# Patient Record
Sex: Female | Born: 1935 | Race: White | Hispanic: No | Smoking: Never smoker
Health system: Southern US, Community
[De-identification: ages and names within clinical notes are randomized; demographics above are authoritative.]

## PROBLEM LIST (undated history)

## (undated) DIAGNOSIS — T7840XA Allergy, unspecified, initial encounter: Secondary | ICD-10-CM

## (undated) DIAGNOSIS — E079 Disorder of thyroid, unspecified: Secondary | ICD-10-CM

## (undated) DIAGNOSIS — K219 Gastro-esophageal reflux disease without esophagitis: Secondary | ICD-10-CM

## (undated) DIAGNOSIS — M419 Scoliosis, unspecified: Secondary | ICD-10-CM

## (undated) DIAGNOSIS — E785 Hyperlipidemia, unspecified: Secondary | ICD-10-CM

## (undated) DIAGNOSIS — Z8619 Personal history of other infectious and parasitic diseases: Secondary | ICD-10-CM

## (undated) HISTORY — DX: Gastro-esophageal reflux disease without esophagitis: K21.9

## (undated) HISTORY — PX: ADENOIDECTOMY: SUR15

## (undated) HISTORY — DX: Personal history of other infectious and parasitic diseases: Z86.19

## (undated) HISTORY — DX: Allergy, unspecified, initial encounter: T78.40XA

## (undated) HISTORY — DX: Hyperlipidemia, unspecified: E78.5

## (undated) HISTORY — PX: TONSILLECTOMY: SUR1361

## (undated) HISTORY — PX: WRIST FRACTURE SURGERY: SHX121

## (undated) HISTORY — PX: REPLACEMENT TOTAL KNEE: SUR1224

## (undated) HISTORY — DX: Scoliosis, unspecified: M41.9

## (undated) HISTORY — DX: Disorder of thyroid, unspecified: E07.9

---

## 1966-02-04 HISTORY — PX: HERNIA REPAIR: SHX51

## 1978-02-04 HISTORY — PX: ABDOMINAL HYSTERECTOMY: SHX81

## 2010-12-05 DIAGNOSIS — N393 Stress incontinence (female) (male): Secondary | ICD-10-CM | POA: Insufficient documentation

## 2012-03-09 DIAGNOSIS — H532 Diplopia: Secondary | ICD-10-CM | POA: Insufficient documentation

## 2016-12-30 LAB — BASIC METABOLIC PANEL
BUN: 11 (ref 4–21)
CO2: 31 — AB (ref 13–22)
Chloride: 108 (ref 99–108)
Creatinine: 0.8 (ref 0.5–1.1)
Glucose: 101
Potassium: 4.4 (ref 3.4–5.3)
Sodium: 146 (ref 137–147)

## 2016-12-30 LAB — CBC AND DIFFERENTIAL
HCT: 43 (ref 36–46)
Hemoglobin: 14.5 (ref 12.0–16.0)
Neutrophils Absolute: 3
Platelets: 326 (ref 150–399)
WBC: 5.8

## 2016-12-30 LAB — LIPID PANEL
Cholesterol: 193 (ref 0–200)
HDL: 69 (ref 35–70)
LDL Cholesterol: 102
LDl/HDL Ratio: 2.8
Triglycerides: 109 (ref 40–160)

## 2016-12-30 LAB — COMPREHENSIVE METABOLIC PANEL
Albumin: 3.6 (ref 3.5–5.0)
Calcium: 9.2 (ref 8.7–10.7)

## 2016-12-30 LAB — TSH: TSH: 1.61 (ref 0.41–5.90)

## 2016-12-30 LAB — HEPATIC FUNCTION PANEL
ALT: 28 (ref 7–35)
AST: 17 (ref 13–35)
Alkaline Phosphatase: 76 (ref 25–125)
Bilirubin, Total: 0.7

## 2016-12-30 LAB — CBC: RBC: 5.03 (ref 3.87–5.11)

## 2019-05-10 ENCOUNTER — Ambulatory Visit: Payer: Self-pay | Admitting: Family Medicine

## 2019-05-13 ENCOUNTER — Encounter: Payer: Self-pay | Admitting: Family Medicine

## 2019-05-13 ENCOUNTER — Other Ambulatory Visit: Payer: Self-pay

## 2019-05-13 ENCOUNTER — Telehealth (INDEPENDENT_AMBULATORY_CARE_PROVIDER_SITE_OTHER): Payer: Medicare Other | Admitting: Family Medicine

## 2019-05-13 VITALS — Ht 63.0 in

## 2019-05-13 DIAGNOSIS — E785 Hyperlipidemia, unspecified: Secondary | ICD-10-CM | POA: Diagnosis not present

## 2019-05-13 DIAGNOSIS — R1013 Epigastric pain: Secondary | ICD-10-CM | POA: Diagnosis not present

## 2019-05-13 DIAGNOSIS — M546 Pain in thoracic spine: Secondary | ICD-10-CM | POA: Diagnosis not present

## 2019-05-13 DIAGNOSIS — G8929 Other chronic pain: Secondary | ICD-10-CM

## 2019-05-13 DIAGNOSIS — E039 Hypothyroidism, unspecified: Secondary | ICD-10-CM

## 2019-05-13 MED ORDER — OMEPRAZOLE 40 MG PO CPDR: 40.0000 mg | DELAYED_RELEASE_CAPSULE | Freq: Every day | ORAL | 3 refills | Status: DC

## 2019-05-13 NOTE — Progress Notes (Signed)
I have discussed the procedure for the virtual visit with the patient who has given consent to proceed with assessment and treatment.   Pt unable to obtain vitals.   Concerned about reflux increase (not using meds), back pain around kidneys (x years on and off), abd pain every time she eats.   Geannie Risen, CMA

## 2019-05-13 NOTE — Progress Notes (Signed)
Virtual Visit via Video   I connected with patient on 05/13/19 at  2:30 PM EDT by a video enabled telemedicine application and verified that I am speaking with the correct person using two identifiers.  Location patient: Home Location provider: [facility], Office Persons participating in the virtual visit: Patient, Provider, CMA (Jess B)  I discussed the limitations of evaluation and management by telemedicine and the availability of in person appointments. The patient expressed understanding and agreed to proceed.  Subjective:   HPI:   New to establish.  Recently moved from [place] and lives at [place].  Went to [facility] 'a couple of times' since moving here.  Hyperlipidemia- chronic problem, on Lipitor 40mg  daily.  Last labs ~3 months ago.  No CP, SOB, HAs.  + abd pain.  Hypothyroid- chronic problem, on Levothyroxine daily.  Last labs ~3 months.  Abd pain- 'every time I eat I get a stomach ache'.  sxs started ~2-3 months ago.  No nausea.  sxs are epigastric.  No radiation of pain into chest.  Occurs just after eating.  Pt has known GI upset w/ NSAIDs- rarely takes.  Not currently taking anything for GERD (has prescription for Omeprazole).  Will take Tums occasionally.    Back pain- 'I have a lot of pain across my kidneys'.  Pain improves w/ moving, worse when lying in bed.  No urinary symptoms like frequency, dysuria, urgency.  Pt has known scoliosis.  Pt doesn't take anything for pain- occasional tylenol.  ROS:   See pertinent positives and negatives per HPI.  Patient Active Problem List   Diagnosis Date Noted  . Diplopia 03/09/2012  . Female stress incontinence 12/05/2010    Social History   Tobacco Use  . Smoking status: Never Smoker  . Smokeless tobacco: Never Used  Substance Use Topics  . Alcohol use: Yes    Current Outpatient Medications:  .  aspirin 81 MG EC tablet, Take by mouth., Disp: , Rfl:  .  atorvastatin (LIPITOR) 40 MG  tablet, Take 40 mg by mouth daily., Disp: , Rfl:  .  Cholecalciferol 50 MCG (2000 UT) CAPS, TAKE 2 CAPSULES BY MOUTH EVERY DAY, Disp: , Rfl:  .  levothyroxine (SYNTHROID) 75 MCG tablet, Take 75 mcg by mouth daily., Disp: , Rfl:  .  loratadine (CLARITIN) 10 MG tablet, Take by mouth., Disp: , Rfl:  .  oxybutynin (DITROPAN-XL) 10 MG 24 hr tablet, Take 10 mg by mouth daily., Disp: , Rfl:  .  omeprazole (PRILOSEC) 40 MG capsule, TAKE 1 CAPSULE BY MOUTH EVERY MORNING BEFORE BREAKFAST. TAKE 1 HOUR BEFORE BREAKFAST, Disp: , Rfl:   No Known Allergies  Objective:   Ht 5\' 3"  (1.6 m)   AAOx3, NAD NCAT, EOMI No obvious CN deficits Coloring WNL Pt is able to speak clearly, coherently without shortness of breath or increased work of breathing.  Thought process is linear.  Mood is appropriate.   Assessment and Plan:   Hyperlipidemia- chronic problem, tolerating statin w/o difficulty.  Had labs done within last 3 months.  Continue current medications.  Will follow.  Hypothyroid- chronic problem, currently asymptomatic.  Had labs done within last 3 months.  Continue current meds.  Will follow.  Epigastric pain- new.  Pt has been experiencing sxs x2-3 months.  Occurs each time she eats.  Will start Omeprazole daily after reviewing with pt why medication is important.  Pt expressed understanding and is in agreement w/ plan.   Back pain- ongoing  issue for pt.  Likely due to her known scoliosis.  Unable to tolerate NSAIDs so encouraged her to start Tylenol Arthritis BID to stay ahead of discomfort.  Pt expressed understanding and is in agreement w/ plan.    Annye Asa, MD 05/13/2019

## 2019-05-14 ENCOUNTER — Ambulatory Visit: Payer: Self-pay | Admitting: Family Medicine

## 2019-05-17 ENCOUNTER — Telehealth: Payer: Self-pay | Admitting: Family Medicine

## 2019-05-17 NOTE — Telephone Encounter (Signed)
LM asking pt to call back to schedule a F/UP appt with Tabori in 2-3 wks for GERD

## 2019-06-04 ENCOUNTER — Encounter: Payer: Self-pay | Admitting: General Practice

## 2019-06-08 ENCOUNTER — Other Ambulatory Visit: Payer: Self-pay

## 2019-06-08 ENCOUNTER — Encounter: Payer: Self-pay | Admitting: Family Medicine

## 2019-06-08 ENCOUNTER — Telehealth (INDEPENDENT_AMBULATORY_CARE_PROVIDER_SITE_OTHER): Payer: Medicare Other | Admitting: Family Medicine

## 2019-06-08 DIAGNOSIS — R1013 Epigastric pain: Secondary | ICD-10-CM | POA: Diagnosis not present

## 2019-06-08 NOTE — Progress Notes (Signed)
I have discussed the procedure for the virtual visit with the patient who has given consent to proceed with assessment and treatment.   Pt unable to obtain vitals.   Nadege Carriger L Delawrence Fridman, CMA     

## 2019-06-08 NOTE — Progress Notes (Signed)
   Virtual Visit via Video   I connected with patient on 06/08/19 at 11:30 AM EDT by a video enabled telemedicine application and verified that I am speaking with the correct person using two identifiers.  Location patient: Home Location provider: [facility], Office Persons participating in the virtual visit: Patient, Provider, CMA (Jess B)  I discussed the limitations of evaluation and management by telemedicine and the availability of in person appointments. The patient expressed understanding and agreed to proceed.  Interactive audio and video telecommunications were attempted between this provider and patient, however failed, due to patient having technical difficulties OR patient did not have access to video capability.  We continued and completed visit with audio only.   Subjective:   HPI:   Epigastric pain/GERD- taking Omeprazole 40mg  daily.  No longer having upset stomach after eating.  Able to sleep well.  Has not had to change diet or restrict herself.  Overall she is pleased  ROS:   See pertinent positives and negatives per HPI.   Patient Active Problem List   Diagnosis Date Noted  . Diplopia 03/09/2012  . Female stress incontinence 12/05/2010    Social History   Tobacco Use  . Smoking status: Never Smoker  . Smokeless tobacco: Never Used  Substance Use Topics  . Alcohol use: Yes    Current Outpatient Medications:  .  aspirin 81 MG EC tablet, Take by mouth., Disp: , Rfl:  .  atorvastatin (LIPITOR) 40 MG tablet, Take 40 mg by mouth daily., Disp: , Rfl:  .  Cholecalciferol 50 MCG (2000 UT) CAPS, TAKE 2 CAPSULES BY MOUTH EVERY DAY, Disp: , Rfl:  .  levothyroxine (SYNTHROID) 75 MCG tablet, Take 75 mcg by mouth daily., Disp: , Rfl:  .  loratadine (CLARITIN) 10 MG tablet, Take by mouth., Disp: , Rfl:  .  omeprazole (PRILOSEC) 40 MG capsule, Take 1 capsule (40 mg total) by mouth daily., Disp: 30 capsule, Rfl: 3 .  oxybutynin (DITROPAN-XL) 10 MG 24 hr tablet,  Take 10 mg by mouth daily., Disp: , Rfl:   No Known Allergies  Objective:   There were no vitals taken for this visit.  Pt is able to speak clearly, coherently without shortness of breath or increased work of breathing.  Thought process is linear.  Mood is appropriate.    Assessment and Plan:   Epigastric pain- much improved since starting daily Omeprazole.  She is pleased w/ the results and has not had to make many (if any) changes to her diet.  Continue current meds.  Will follow.   12/07/2010, MD 06/08/2019  Time spent with the patient: 7 minutes, of which >50% was spent in obtaining information about symptoms, reviewing previous labs, evaluations, and treatments, counseling about condition (please see the discussed topics above), and developing a plan to further investigate it; had a number of questions which I addressed.

## 2019-08-02 NOTE — Progress Notes (Signed)
Subjective:   Sabrina Castaneda is a 84 y.o. female who presents for an Initial Medicare Annual Wellness Visit.  Review of Systems     Cardiac Risk Factors include: advanced age (>60men, >67 women);sedentary lifestyle     Objective:    Today's Vitals   08/03/19 1024 08/03/19 1026  BP: 132/84   Pulse: 82   Resp: 12   Temp: 98.2 F (36.8 C)   TempSrc: Temporal   SpO2: 96%   Weight: 153 lb (69.4 kg)   Height: 5\' 3"  (1.6 m)   PainSc:  5    Body mass index is 27.1 kg/m.  Advanced Directives 08/03/2019  Does Patient Have a Medical Advance Directive? Yes  Type of 08/05/2019 of Sabrina Castaneda;Living will  Copy of Healthcare Power of Attorney in Chart? No - copy requested    Current Medications (verified) Outpatient Encounter Medications as of 08/03/2019  Medication Sig  . aspirin 81 MG EC tablet Take by mouth.  08/05/2019 atorvastatin (LIPITOR) 40 MG tablet Take 40 mg by mouth daily.  . Cholecalciferol 50 MCG (2000 UT) CAPS TAKE 2 CAPSULES BY MOUTH EVERY DAY  . levothyroxine (SYNTHROID) 75 MCG tablet Take 75 mcg by mouth daily.  Marland Kitchen loratadine (CLARITIN) 10 MG tablet Take by mouth.  Marland Kitchen omeprazole (PRILOSEC) 40 MG capsule Take 1 capsule (40 mg total) by mouth daily.  Marland Kitchen oxybutynin (DITROPAN-XL) 10 MG 24 hr tablet Take 10 mg by mouth daily.   No facility-administered encounter medications on file as of 08/03/2019.    Allergies (verified) Patient has no known allergies.   History: Past Medical History:  Diagnosis Date  . Allergy   . GERD (gastroesophageal reflux disease)   . History of chicken pox   . Hyperlipidemia   . Scoliosis   . Thyroid disease    Past Surgical History:  Procedure Laterality Date  . ABDOMINAL HYSTERECTOMY  1980  . HERNIA REPAIR  02/04/1966  . REPLACEMENT TOTAL KNEE Right   . WRIST FRACTURE SURGERY Bilateral    History reviewed. No pertinent family history. Social History   Socioeconomic History  . Marital status: Widowed    Spouse  name: Not on file  . Number of children: Not on file  . Years of education: Not on file  . Highest education level: Not on file  Occupational History  . Not on file  Tobacco Use  . Smoking status: Never Smoker  . Smokeless tobacco: Never Used  Vaping Use  . Vaping Use: Never used  Substance and Sexual Activity  . Alcohol use: Yes    Comment: occasionally  . Drug use: Never  . Sexual activity: Not Currently  Other Topics Concern  . Not on file  Social History Narrative  . Not on file   Social Determinants of Health   Financial Resource Strain: Low Risk   . Difficulty of Paying Living Expenses: Not hard at all  Food Insecurity: No Food Insecurity  . Worried About 04/05/1966 in the Last Year: Never true  . Ran Out of Food in the Last Year: Never true  Transportation Needs: No Transportation Needs  . Lack of Transportation (Medical): No  . Lack of Transportation (Non-Medical): No  Physical Activity: Inactive  . Days of Exercise per Week: 0 days  . Minutes of Exercise per Session: 0 min  Stress: No Stress Concern Present  . Feeling of Stress : Not at all  Social Connections: Moderately Integrated  . Frequency of Communication  with Friends and Family: More than three times a week  . Frequency of Social Gatherings with Friends and Family: More than three times a week  . Attends Religious Services: More than 4 times per year  . Active Member of Clubs or Organizations: Yes  . Attends Banker Meetings: More than 4 times per year  . Marital Status: Widowed    Tobacco Counseling Counseling given: Not Answered   Clinical Intake:  Pre-visit preparation completed: Yes  Pain : 0-10 Pain Score: 5  Pain Location: Shoulder Pain Orientation: Right Pain Onset: More than a month ago Pain Frequency: Constant Pain Relieving Factors: Tylenol Arthriris  Pain Relieving Factors: Tylenol Arthriris  Nutritional Status: BMI 25 -29 Overweight Nutritional Risks:  None Diabetes: No  How often do you need to have someone help you when you read instructions, pamphlets, or other written materials from your doctor or pharmacy?: 1 - Never  Diabetic?No  Interpreter Needed?: No  Information entered by :: Sabrina Sales LPn   Activities of Daily Living In your present state of health, do you have any difficulty performing the following activities: 08/03/2019 06/08/2019  Hearing? N N  Vision? N N  Difficulty concentrating or making decisions? Y N  Comment occasionally forgets -  Walking or climbing stairs? N N  Dressing or bathing? N N  Doing errands, shopping? N N  Preparing Food and eating ? N -  Using the Toilet? N -  In the past six months, have you accidently leaked urine? N -  Do you have problems with loss of bowel control? N -  Managing your Medications? N -  Managing your Finances? N -  Housekeeping or managing your Housekeeping? N -    Patient Care Team: Sabrina Hatch, MD as PCP - General (Family Medicine)  Indicate any recent Medical Services you may have received from other than [facility] providers in the past year (date may be approximate).     Assessment:   This is a routine wellness examination for [name].  Hearing/Vision screen  Hearing Screening   125Hz  250Hz  500Hz  1000Hz  2000Hz  3000Hz  4000Hz  6000Hz  8000Hz   Right ear:           Left ear:           Comments: No issues  Vision Screening Comments: Wears glasses-Last eye exam 04/2019 Sees Dr.  Dietary issues and exercise activities discussed: Current Exercise Habits: The patient does not participate in regular exercise at present, Exercise limited by: orthopedic condition(s)  Goals    . Patient Stated     Continue healthy eating plan      Depression Screen PHQ 2/9 Scores 08/03/2019 06/08/2019 05/13/2019  PHQ - 2 Score 0 0 0  PHQ- 9 Score - - 0    Fall Risk Fall Risk  08/03/2019 06/08/2019 05/13/2019  Falls in the past year? 0 0 0  Number falls in past yr: 0 0 0    Injury with Fall? 0 0 0  Follow up Falls prevention discussed Falls evaluation completed Falls evaluation completed    Any stairs in or around the home? No   Home free of loose throw rugs in walkways, pet beds, electrical cords, etc? Yes  Adequate lighting in your home to reduce risk of falls? Yes   ASSISTIVE DEVICES UTILIZED TO PREVENT FALLS:  Life alert? No  Use of a cane, walker or w/c? No  Grab bars in the bathroom? Yes Shower chair or bench in shower? No  Elevated  toilet seat or a handicapped toilet? No   TIMED UP AND GO:  Was the test performed? Yes .  Length of time to ambulate 10 feet: 11 sec.   Gait slow and steady without use of assistive device  Cognitive Function: Patient paints, does crossword puzzles & reads frequently.     6CIT Screen 08/03/2019  What Year? 0 points  What month? 0 points  What time? 0 points  Count back from 20 0 points  Months in reverse 0 points  Repeat phrase 0 points  Total Score 0    Immunizations Immunization History  Administered Date(s) Administered  . H1N1 03/01/2008  . Influenza, High Dose Seasonal PF 11/17/2011, 11/25/2014, 11/21/2015, 11/20/2016  . Influenza-Unspecified 12/21/1998, 11/16/2003, 11/26/2004, 11/19/2005, 11/25/2006, 03/01/2008, 10/05/2008  . Moderna SARS-COVID-2 Vaccination 02/16/2019, 03/17/2019  . Pneumococcal Conjugate-13 12/14/2014  . Pneumococcal Polysaccharide-23 12/21/1998, 11/11/2008  . Td 02/17/2007  . Tdap 12/16/2008  . Zoster 04/09/2005    TDAP status: Due, Education has been provided regarding the importance of this vaccine. Advised may receive this vaccine at local pharmacy or Health Dept. Aware to provide a copy of the vaccination record if obtained from local pharmacy or Health Dept. Verbalized acceptance and understanding.   Flu Vaccine status: Up to date Due 10/2019  Pneumococcal vaccine status: Up to date   Covid-19 vaccine status: Completed vaccines  Qualifies for Shingles Vaccine?  Yes   Zostavax completed Yes   Shingrix Completed?: No.    Education has been provided regarding the importance of this vaccine. Patient has been advised to call insurance company to determine out of pocket expense if they have not yet received this vaccine. Advised may also receive vaccine at local pharmacy or Health Dept. Verbalized acceptance and understanding.  Screening Tests Health Maintenance  Topic Date Due  . DEXA SCAN  Never done  . TETANUS/TDAP  06/07/2020 (Originally 12/17/2018)  . INFLUENZA VACCINE  09/05/2019  . COVID-19 Vaccine  Completed  . PNA vac Low Risk Adult  Completed    Health Maintenance  Health Maintenance Due  Topic Date Due  . DEXA SCAN  Never done    Colorectal cancer screening: No longer required.    Mammogram status: No longer required.    Bone Density status: Completed unsure of date. Results reflect: Bone density results: OSTEOPENIA. Repeat every 2 years.Per patient  Lung Cancer Screening: (Low Dose CT Chest recommended if Age 46-80 years, 30 pack-year currently smoking OR have quit w/in 15years.) does not qualify.     Additional Screening:  Hepatitis C Screening: does not qualify  Vision Screening: Recommended annual ophthalmology exams for early detection of glaucoma and other disorders of the eye. Is the patient up to date with their annual eye exam?  Yes  Who is the provider or what is the name of the office in which the patient attends annual eye exams? Dr. Delman Cheadle  Dental Screening: Recommended annual dental exams for proper oral hygiene  Community Resource Referral / Chronic Care Management: CRR required this visit?  No   CCM required this visit?  No      Plan:     I have personally reviewed and noted the following in the patient's chart:   . Medical and social history . Use of alcohol, tobacco or illicit drugs  . Current medications and supplements . Functional ability and status . Nutritional status . Physical  activity . Advanced directives . List of other physicians . Hospitalizations, surgeries, and ER visits in previous 12 months .  Vitals . Screenings to include cognitive, depression, and falls . Referrals and appointments  In addition, I have reviewed and discussed with patient certain preventive protocols, quality metrics, and best practice recommendations. A written personalized care plan for preventive services as well as general preventive health recommendations were provided to patient.     Roanna Raider, LPN   05/18/2438  Nurse Health Advisor  Nurse Notes: Patient made an appt to see Dr. Beverely Low for her right shoulder & left 4th finger pain.

## 2019-08-03 ENCOUNTER — Ambulatory Visit (INDEPENDENT_AMBULATORY_CARE_PROVIDER_SITE_OTHER): Payer: Medicare Other

## 2019-08-03 ENCOUNTER — Other Ambulatory Visit: Payer: Self-pay

## 2019-08-03 VITALS — BP 132/84 | HR 82 | Temp 98.2°F | Resp 12 | Ht 63.0 in | Wt 153.0 lb

## 2019-08-03 DIAGNOSIS — Z0000 Encounter for general adult medical examination without abnormal findings: Secondary | ICD-10-CM

## 2019-08-03 NOTE — Patient Instructions (Signed)
Sabrina Castaneda , Thank you for taking time to come for your Medicare Wellness Visit. I appreciate your ongoing commitment to your health goals. Please review the following plan we discussed and let me know if I can assist you in the future.   Screening recommendations/referrals: Colonoscopy: No longer indicated Mammogram: No longer indicated Bone Density: Unsure of last date. Call the office if you would like to schedule. Recommended yearly ophthalmology/optometry visit for glaucoma screening and checkup Recommended yearly dental visit for hygiene and checkup  Vaccinations: Influenza vaccine: Up to date-Due-10/2019 Pneumococcal vaccine: Completed vaccines Tdap vaccine: Discuss with pharmacy. Shingles vaccine: Discuss with pharmacy  Covid-19:Completed vaccines  Advanced directives: Bring a copy to your next office visit  Conditions/risks identified: See problem list  Next appointment: Follow up in one year for your annual wellness visit    Preventive Care 65 Years and Older, Female Preventive care refers to lifestyle choices and visits with your health care provider that can promote health and wellness. What does preventive care include?  A yearly physical exam. This is also called an annual well check.  Dental exams once or twice a year.  Routine eye exams. Ask your health care provider how often you should have your eyes checked.  Personal lifestyle choices, including:  Daily care of your teeth and gums.  Regular physical activity.  Eating a healthy diet.  Avoiding tobacco and drug use.  Limiting alcohol use.  Practicing safe sex.  Taking low-dose aspirin every day.  Taking vitamin and mineral supplements as recommended by your health care provider. What happens during an annual well check? The services and screenings done by your health care provider during your annual well check will depend on your age, overall health, lifestyle risk factors, and family history of  disease. Counseling  Your health care provider may ask you questions about your:  Alcohol use.  Tobacco use.  Drug use.  Emotional well-being.  Home and relationship well-being.  Sexual activity.  Eating habits.  History of falls.  Memory and ability to understand (cognition).  Work and work Astronomer.  Reproductive health. Screening  You may have the following tests or measurements:  Height, weight, and BMI.  Blood pressure.  Lipid and cholesterol levels. These may be checked every 5 years, or more frequently if you are over 90 years old.  Skin check.  Lung cancer screening. You may have this screening every year starting at age 7 if you have a 30-pack-year history of smoking and currently smoke or have quit within the past 15 years.  Fecal occult blood test (FOBT) of the stool. You may have this test every year starting at age 62.  Flexible sigmoidoscopy or colonoscopy. You may have a sigmoidoscopy every 5 years or a colonoscopy every 10 years starting at age 17.  Hepatitis C blood test.  Hepatitis B blood test.  Sexually transmitted disease (STD) testing.  Diabetes screening. This is done by checking your blood sugar (glucose) after you have not eaten for a while (fasting). You may have this done every 1-3 years.  Bone density scan. This is done to screen for osteoporosis. You may have this done starting at age 27.  Mammogram. This may be done every 1-2 years. Talk to your health care provider about how often you should have regular mammograms. Talk with your health care provider about your test results, treatment options, and if necessary, the need for more tests. Vaccines  Your health care provider may recommend certain vaccines, such as:  Influenza vaccine. This is recommended every year.  Tetanus, diphtheria, and acellular pertussis (Tdap, Td) vaccine. You may need a Td booster every 10 years.  Zoster vaccine. You may need this after age  7.  Pneumococcal 13-valent conjugate (PCV13) vaccine. One dose is recommended after age 54.  Pneumococcal polysaccharide (PPSV23) vaccine. One dose is recommended after age 41. Talk to your health care provider about which screenings and vaccines you need and how often you need them. This information is not intended to replace advice given to you by your health care provider. Make sure you discuss any questions you have with your health care provider. Document Released: 02/17/2015 Document Revised: 10/11/2015 Document Reviewed: 11/22/2014 Elsevier Interactive Patient Education  2017 [employer] Prevention in the Home Falls can cause injuries. They can happen to people of all ages. There are many things you can do to make your home safe and to help prevent falls. What can I do on the outside of my home?  Regularly fix the edges of walkways and driveways and fix any cracks.  Remove anything that might make you trip as you walk through a door, such as a raised step or threshold.  Trim any bushes or trees on the path to your home.  Use bright outdoor lighting.  Clear any walking paths of anything that might make someone trip, such as rocks or tools.  Regularly check to see if handrails are loose or broken. Make sure that both sides of any steps have handrails.  Any raised decks and porches should have guardrails on the edges.  Have any leaves, snow, or ice cleared regularly.  Use sand or salt on walking paths during winter.  Clean up any spills in your garage right away. This includes oil or grease spills. What can I do in the bathroom?  Use night lights.  Install grab bars by the toilet and in the tub and shower. Do not use towel bars as grab bars.  Use non-skid mats or decals in the tub or shower.  If you need to sit down in the shower, use a plastic, non-slip stool.  Keep the floor dry. Clean up any water that spills on the floor as soon as it happens.  Remove  soap buildup in the tub or shower regularly.  Attach bath mats securely with double-sided non-slip rug tape.  Do not have throw rugs and other things on the floor that can make you trip. What can I do in the bedroom?  Use night lights.  Make sure that you have a light by your bed that is easy to reach.  Do not use any sheets or blankets that are too big for your bed. They should not hang down onto the floor.  Have a firm chair that has side arms. You can use this for support while you get dressed.  Do not have throw rugs and other things on the floor that can make you trip. What can I do in the kitchen?  Clean up any spills right away.  Avoid walking on wet floors.  Keep items that you use a lot in easy-to-reach places.  If you need to reach something above you, use a strong step stool that has a grab bar.  Keep electrical cords out of the way.  Do not use floor polish or wax that makes floors slippery. If you must use wax, use non-skid floor wax.  Do not have throw rugs and other things on the floor that  can make you trip. What can I do with my stairs?  Do not leave any items on the stairs.  Make sure that there are handrails on both sides of the stairs and use them. Fix handrails that are broken or loose. Make sure that handrails are as long as the stairways.  Check any carpeting to make sure that it is firmly attached to the stairs. Fix any carpet that is loose or worn.  Avoid having throw rugs at the top or bottom of the stairs. If you do have throw rugs, attach them to the floor with carpet tape.  Make sure that you have a light switch at the top of the stairs and the bottom of the stairs. If you do not have them, ask someone to add them for you. What else can I do to help prevent falls?  Wear shoes that:  Do not have high heels.  Have rubber bottoms.  Are comfortable and fit you well.  Are closed at the toe. Do not wear sandals.  If you use a  stepladder:  Make sure that it is fully opened. Do not climb a closed stepladder.  Make sure that both sides of the stepladder are locked into place.  Ask someone to hold it for you, if possible.  Clearly mark and make sure that you can see:  Any grab bars or handrails.  First and last steps.  Where the edge of each step is.  Use tools that help you move around (mobility aids) if they are needed. These include:  Canes.  Walkers.  Scooters.  Crutches.  Turn on the lights when you go into a dark area. Replace any light bulbs as soon as they burn out.  Set up your furniture so you have a clear path. Avoid moving your furniture around.  If any of your floors are uneven, fix them.  If there are any pets around you, be aware of where they are.  Review your medicines with your doctor. Some medicines can make you feel dizzy. This can increase your chance of falling. Ask your doctor what other things that you can do to help prevent falls. This information is not intended to replace advice given to you by your health care provider. Make sure you discuss any questions you have with your health care provider. Document Released: 11/17/2008 Document Revised: 06/29/2015 Document Reviewed: 02/25/2014 Elsevier Interactive Patient Education  2017 [employer].

## 2019-08-09 ENCOUNTER — Other Ambulatory Visit: Payer: Self-pay | Admitting: Family Medicine

## 2019-08-16 ENCOUNTER — Encounter: Payer: Self-pay | Admitting: Family Medicine

## 2019-08-16 ENCOUNTER — Other Ambulatory Visit: Payer: Self-pay

## 2019-08-16 ENCOUNTER — Ambulatory Visit: Payer: Medicare Other | Admitting: Family Medicine

## 2019-08-16 VITALS — BP 121/81 | HR 63 | Temp 97.8°F | Resp 16 | Ht 63.0 in | Wt 153.1 lb

## 2019-08-16 DIAGNOSIS — M25511 Pain in right shoulder: Secondary | ICD-10-CM

## 2019-08-16 DIAGNOSIS — M65342 Trigger finger, left ring finger: Secondary | ICD-10-CM

## 2019-08-16 DIAGNOSIS — E785 Hyperlipidemia, unspecified: Secondary | ICD-10-CM | POA: Diagnosis not present

## 2019-08-16 DIAGNOSIS — E039 Hypothyroidism, unspecified: Secondary | ICD-10-CM | POA: Insufficient documentation

## 2019-08-16 LAB — CBC WITH DIFFERENTIAL/PLATELET
Basophils Absolute: 0.1 10*3/uL (ref 0.0–0.1)
Basophils Relative: 1.2 % (ref 0.0–3.0)
Eosinophils Absolute: 0 10*3/uL (ref 0.0–0.7)
Eosinophils Relative: 0.7 % (ref 0.0–5.0)
HCT: 42 % (ref 36.0–46.0)
Hemoglobin: 13.9 g/dL (ref 12.0–15.0)
Lymphocytes Relative: 22.8 % (ref 12.0–46.0)
Lymphs Abs: 1.3 10*3/uL (ref 0.7–4.0)
MCHC: 33.1 g/dL (ref 30.0–36.0)
MCV: 88.1 fl (ref 78.0–100.0)
Monocytes Absolute: 0.6 10*3/uL (ref 0.1–1.0)
Monocytes Relative: 11 % (ref 3.0–12.0)
Neutro Abs: 3.7 10*3/uL (ref 1.4–7.7)
Neutrophils Relative %: 64.3 % (ref 43.0–77.0)
Platelets: 333 10*3/uL (ref 150.0–400.0)
RBC: 4.77 Mil/uL (ref 3.87–5.11)
RDW: 13.8 % (ref 11.5–15.5)
WBC: 5.8 10*3/uL (ref 4.0–10.5)

## 2019-08-16 LAB — HEPATIC FUNCTION PANEL
ALT: 18 U/L (ref 0–35)
AST: 17 U/L (ref 0–37)
Albumin: 4.4 g/dL (ref 3.5–5.2)
Alkaline Phosphatase: 69 U/L (ref 39–117)
Bilirubin, Direct: 0.1 mg/dL (ref 0.0–0.3)
Total Bilirubin: 0.6 mg/dL (ref 0.2–1.2)
Total Protein: 6.4 g/dL (ref 6.0–8.3)

## 2019-08-16 LAB — BASIC METABOLIC PANEL
BUN: 11 mg/dL (ref 6–23)
CO2: 28 mEq/L (ref 19–32)
Calcium: 9.9 mg/dL (ref 8.4–10.5)
Chloride: 101 mEq/L (ref 96–112)
Creatinine, Ser: 0.68 mg/dL (ref 0.40–1.20)
GFR: 82.88 mL/min (ref >60.00)
Glucose, Bld: 83 mg/dL (ref 70–99)
Potassium: 4.9 mEq/L (ref 3.5–5.1)
Sodium: 135 mEq/L (ref 135–145)

## 2019-08-16 LAB — LIPID PANEL
Cholesterol: 179 mg/dL (ref 0–200)
HDL: 62.9 mg/dL (ref >39.00)
LDL Cholesterol: 78 mg/dL (ref 0–99)
NonHDL: 116.01
Total CHOL/HDL Ratio: 3
Triglycerides: 192 mg/dL — ABNORMAL HIGH (ref 0.0–149.0)
VLDL: 38.4 mg/dL (ref 0.0–40.0)

## 2019-08-16 LAB — TSH: TSH: 0.77 u[IU]/mL (ref 0.35–4.50)

## 2019-08-16 MED ORDER — PREDNISONE 10 MG PO TABS
ORAL_TABLET | ORAL | 0 refills | Status: DC
Start: 2019-08-16 — End: 2019-09-10

## 2019-08-16 NOTE — Patient Instructions (Signed)
Schedule your complete physical in 3-4 months We'll notify you of your lab results and make any changes if needed We'll call you with your Orthopedic appt START the Prednisone as directed- 3 tabs at the same time x3 days, then 2 tabs at the same time x3 days, and then 1 tab daily.  Take w/ food. Ice the shoulder as needed for pain relief You can continue tylenol for breakthrough pain Call with any questions or concerns Have a great summer!!

## 2019-08-16 NOTE — Progress Notes (Signed)
   Subjective:    Patient ID: Sabrina Castaneda, female    DOB: 1935/06/03, 84 y.o.   MRN: [id number]  HPI Hyperlipidemia- chronic problem, pt is on Lipitor 40mg  daily.  Due for labs.  No CP, SOB, abd pain, N/V.  Hypothyroid- chronic problem, on Levothyroxine daily.  Pt reports energy level is stable.  No changes to skin/hair/nails.  Trigger finger- L ring finger.  Pt reports there is a 'knot' at PIP joint.  Has a hard time getting rings off.  Pt reports sxs started 'a couple of months ago'.  Shoulder pain- R side.  Pt reports she has scoliosis 'so there's always some kind of pain in my back'.  Pain is worse at night, unable to sleep.  sxs started ~6 weeks.  sxs started gradually and have worsened.  Pt reports it is painful to lift arm overhead.  + impingement signs.  No known injury.  No relief w/ tylenol.   Review of Systems For ROS see HPI   This visit occurred during the SARS-CoV-2 public health emergency.  Safety protocols were in place, including screening questions prior to the visit, additional usage of staff PPE, and extensive cleaning of exam room while observing appropriate contact time as indicated for disinfecting solutions.       Objective:   Physical Exam Vitals reviewed.  Constitutional:      General: She is not in acute distress.    Appearance: Normal appearance. She is well-developed.  HENT:     Head: Normocephalic and atraumatic.  Eyes:     Conjunctiva/sclera: Conjunctivae normal.     Pupils: Pupils are equal, round, and reactive to light.  Neck:     Thyroid: No thyromegaly.  Cardiovascular:     Rate and Rhythm: Normal rate and regular rhythm.     Heart sounds: Normal heart sounds. No murmur heard.   Pulmonary:     Effort: Pulmonary effort is normal. No respiratory distress.     Breath sounds: Normal breath sounds.  Abdominal:     General: There is no distension.     Palpations: Abdomen is soft.     Tenderness: There is no abdominal tenderness.    Musculoskeletal:        General: Swelling (swelling of PIP joint of L ring finger w/ nodularity) present.     Cervical back: Normal range of motion and neck supple.     Comments: R shoulder pain w/ overhead motion, + impingement signs  Lymphadenopathy:     Cervical: No cervical adenopathy.  Skin:    General: Skin is warm and dry.  Neurological:     Mental Status: She is alert and oriented to person, place, and time.  Psychiatric:        Behavior: Behavior normal.           Assessment & Plan:  R shoulder pain- new.  sxs started 6 weeks ago and have been worsening.  + impingement signs.  Start low dose Prednisone for pain relief and refer to ortho.  Pt expressed understanding and is in agreement w/ plan.   L trigger finger- new.  Ring finger.  + nodularity over PIP joint and in palm.  Refer to ortho.

## 2019-08-16 NOTE — Assessment & Plan Note (Signed)
Chronic problem, tolerating statin w/o difficulty.  Check labs.  Adjust meds prn  

## 2019-08-16 NOTE — Assessment & Plan Note (Signed)
Chronic problem, currently asymptomatic.  Check labs.  Adjust meds prn  

## 2019-08-17 ENCOUNTER — Encounter: Payer: Self-pay | Admitting: General Practice

## 2019-08-20 ENCOUNTER — Other Ambulatory Visit: Payer: Self-pay

## 2019-08-20 ENCOUNTER — Encounter: Payer: Self-pay | Admitting: Family Medicine

## 2019-08-20 ENCOUNTER — Ambulatory Visit: Payer: Self-pay

## 2019-08-20 ENCOUNTER — Ambulatory Visit: Payer: Medicare Other | Admitting: Family Medicine

## 2019-08-20 DIAGNOSIS — M546 Pain in thoracic spine: Secondary | ICD-10-CM | POA: Diagnosis not present

## 2019-08-20 DIAGNOSIS — M65342 Trigger finger, left ring finger: Secondary | ICD-10-CM | POA: Diagnosis not present

## 2019-08-20 MED ORDER — DICLOFENAC SODIUM 1 % EX GEL
4.0000 g | Freq: Four times a day (QID) | CUTANEOUS | 6 refills | Status: DC | PRN
Start: 2019-08-20 — End: 2020-11-08

## 2019-08-20 NOTE — Progress Notes (Signed)
Office Visit Note   Patient: Sabrina Castaneda           Date of Birth: [birth year]           MRN: [id number] Visit Date: 08/20/2019 Requested by: Sheliah Hatch, MD [address] [place],  [place] [address] PCP: Sheliah Hatch, MD  Subjective: Chief Complaint  Patient presents with  . Spine - Pain    Burning pain in the middle portion of her back. Has known scoliosis. "Uncomfortable" with many of her daily activities. Wants to know ways to manage the pain better in her back. On 4th day of a prednisone taper.  . Left Ring Finger - Pain    Trigger finger. Swells and feels "full" at times. On 4th day of a prednisone taper - feeling some better with this.    HPI: She is here with back pain and left fourth finger pain.  She has a long history of scoliosis.  She is aches and pains over the years.  Lately she has been having a burning pain in the right upper back and other pain near the left shoulder blade region.  She wants to know how to manage her pain better.  She is currently on prednisone which seems to be helping.  She has a few more days left.  Her left ring finger has been triggering for about 5 or 6 weeks.  No injury.  She had some swelling in the finger and difficulty removing her ring but she finally got hit off.  She is right-hand dominant.              ROS:   All other systems were reviewed and are negative.  Objective: Vital Signs: There were no vitals taken for this visit.  Physical Exam:  General:  Alert and oriented, in no acute distress. Pulm:  Breathing unlabored. Psy:  Normal mood, congruent affect  Back: She has substantial scoliosis with rib hump.  She has trigger points in both rhomboid areas.  No significant tenderness over the thoracic spinous processes. Left hand: Her fourth finger has a tender nodule at the A1 pulley and it almost triggers in flexion but not quite.  The soft tissue on the volar aspect of her proximal phalanx is also a little bit  swollen compared to the right fourth finger.  Imaging: XR SCOLIOSIS EVAL COMPLETE SPINE 2 OR 3 VIEWS  Result Date: 08/20/2019 Scoliosis x-rays reveal severe thoracolumbar scoliosis with convex to the right.  She has associated degenerative changes in the thoracic and lumbar spine.  Cervical spine also has degenerative disc disease at the lower levels.  Hip joints are mildly arthritic.   Assessment & Plan: 1.  Chronic back pain with scoliosis and myofascial pain -Physical therapy referral.  Could try scoliosis bracing in the future if desired.  She is already taking vitamin D3.  2.  Left fourth trigger finger -Voltaren gel topically.  Could contemplate iontophoresis or injection if symptoms worsen.     Procedures: No procedures performed  No notes on file     PMFS History: Patient Active Problem List   Diagnosis Date Noted  . Hyperlipidemia 08/16/2019  . Hypothyroid 08/16/2019  . Diplopia 03/09/2012  . Female stress incontinence 12/05/2010   Past Medical History:  Diagnosis Date  . Allergy   . GERD (gastroesophageal reflux disease)   . History of chicken pox   . Hyperlipidemia   . Scoliosis   . Thyroid disease  History reviewed. No pertinent family history.  Past Surgical History:  Procedure Laterality Date  . ABDOMINAL HYSTERECTOMY  1980  . HERNIA REPAIR  02/04/1966  . REPLACEMENT TOTAL KNEE Right   . WRIST FRACTURE SURGERY Bilateral    Social History   Occupational History  . Not on file  Tobacco Use  . Smoking status: Never Smoker  . Smokeless tobacco: Never Used  Vaping Use  . Vaping Use: Never used  Substance and Sexual Activity  . Alcohol use: Yes    Comment: occasionally  . Drug use: Never  . Sexual activity: Not Currently

## 2019-09-10 ENCOUNTER — Encounter: Payer: Self-pay | Admitting: Family Medicine

## 2019-09-10 ENCOUNTER — Ambulatory Visit (INDEPENDENT_AMBULATORY_CARE_PROVIDER_SITE_OTHER): Payer: Medicare Other | Admitting: Family Medicine

## 2019-09-10 ENCOUNTER — Other Ambulatory Visit: Payer: Self-pay

## 2019-09-10 VITALS — BP 120/80 | HR 78 | Temp 98.1°F | Resp 16 | Ht 63.0 in | Wt 152.5 lb

## 2019-09-10 DIAGNOSIS — Z780 Asymptomatic menopausal state: Secondary | ICD-10-CM | POA: Diagnosis not present

## 2019-09-10 DIAGNOSIS — Z0000 Encounter for general adult medical examination without abnormal findings: Secondary | ICD-10-CM | POA: Diagnosis not present

## 2019-09-10 NOTE — Patient Instructions (Addendum)
Follow up in 6 months to recheck cholesterol and thyroid No need for lab work today- last month's labs look great! We'll call you with your bone density appt Continue to work on healthy diet and exercise as able Call with any questions or concerns Stay Safe!  Stay Healthy!

## 2019-09-10 NOTE — Assessment & Plan Note (Signed)
Pt's PE WNL w/ exception of known scoliosis.  UTD on immunizations.  Reports she is no longer doing mammograms or colon cancer screen.  DEXA ordered.  Reviewed recent labs and no need to repeat at this time.  Discussed COVID safety, healthy diet, physical activity as able.

## 2019-09-10 NOTE — Progress Notes (Signed)
° °  Subjective:    Patient ID: Sabrina Castaneda, female    DOB: [birth year], 84 y.o.   MRN: [id number]  HPI CPE- due for DEXA.  UTD on immunizations including COVID.  No longer doing mammograms.    Reviewed past medical, surgical, family and social histories.   Patient Care Team    Relationship Specialty Notifications Start End  Sheliah Hatch, MD PCP - General Family Medicine  05/13/19     Health Maintenance  Topic Date Due   DEXA SCAN  Never done   INFLUENZA VACCINE  09/05/2019   TETANUS/TDAP  06/07/2020 (Originally 12/17/2018)   COVID-19 Vaccine  Completed   PNA vac Low Risk Adult  Completed      Review of Systems Patient reports no vision/ hearing changes, adenopathy,fever, weight change,  persistant/recurrent hoarseness , swallowing issues, chest pain, palpitations, edema, persistant/recurrent cough, hemoptysis, dyspnea (rest/exertional/paroxysmal nocturnal), gastrointestinal bleeding (melena, rectal bleeding), abdominal pain, bowel changes, GU symptoms (dysuria, hematuria, incontinence), Gyn symptoms (abnormal  bleeding, pain),  syncope, focal weakness, memory loss, numbness & tingling, skin/hair/nail changes, abnormal bruising or bleeding, anxiety, or depression.   + GERD  This visit occurred during the SARS-CoV-2 public health emergency.  Safety protocols were in place, including screening questions prior to the visit, additional usage of staff PPE, and extensive cleaning of exam room while observing appropriate contact time as indicated for disinfecting solutions.       Objective:   Physical Exam General Appearance:    Alert, cooperative, no distress, appears stated age  Head:    Normocephalic, without obvious abnormality, atraumatic  Eyes:    PERRL, conjunctiva/corneas clear, EOM's intact, fundi    benign, both eyes  Ears:    Normal TM's and external ear canals, both ears  Nose:   Deferred due to COVID  Throat:   Neck:   Supple, symmetrical, trachea midline, no  adenopathy;    Thyroid: no enlargement/tenderness/nodules  Back:     Marked scoliosis  Lungs:     Clear to auscultation bilaterally, respirations unlabored  Chest Wall:    No tenderness or deformity   Heart:    Regular rate and rhythm, S1 and S2 normal, no murmur, rub   or gallop  Breast Exam:    Deferred  Abdomen:     Soft, non-tender, bowel sounds active all four quadrants,    no masses, no organomegaly  Genitalia:    Deferred  Rectal:    Extremities:   Extremities normal, atraumatic, no cyanosis or edema  Pulses:   2+ and symmetric all extremities  Skin:   Skin color, texture, turgor normal, no rashes or lesions  Lymph nodes:   Cervical, supraclavicular, and axillary nodes normal  Neurologic:   CNII-XII intact, normal strength, sensation and reflexes    throughout          Assessment & Plan:

## 2019-09-21 ENCOUNTER — Other Ambulatory Visit: Payer: Medicare Other

## 2019-09-21 ENCOUNTER — Encounter: Payer: Self-pay | Admitting: General Practice

## 2019-10-04 ENCOUNTER — Ambulatory Visit: Payer: Medicare Other | Attending: Family Medicine

## 2019-10-04 ENCOUNTER — Other Ambulatory Visit: Payer: Self-pay

## 2019-10-04 DIAGNOSIS — R252 Cramp and spasm: Secondary | ICD-10-CM | POA: Diagnosis present

## 2019-10-04 DIAGNOSIS — M25511 Pain in right shoulder: Secondary | ICD-10-CM | POA: Insufficient documentation

## 2019-10-04 DIAGNOSIS — R293 Abnormal posture: Secondary | ICD-10-CM | POA: Diagnosis present

## 2019-10-04 DIAGNOSIS — M546 Pain in thoracic spine: Secondary | ICD-10-CM | POA: Diagnosis not present

## 2019-10-04 DIAGNOSIS — G8929 Other chronic pain: Secondary | ICD-10-CM | POA: Diagnosis present

## 2019-10-04 NOTE — Patient Instructions (Signed)
Access Code: [email or web address] URL: [email or web address] Date: 10/04/2019 Prepared by: Tresa Endo  Exercises Seated Correct Posture - 5 x daily - 7 x weekly Seated Cervical Sidebending AROM - 3 x daily - 7 x weekly - 1 sets - 3 reps - 20 hold Supine Shoulder Flexion AAROM with Hands Clasped - 3 x daily - 7 x weekly - 1 sets - 10 reps - 10 hold Seated Scapular Retraction - 5 x daily - 7 x weekly - 1 sets - 10 reps - 5 hold Supine Scapular Retraction - 2 x daily - 7 x weekly

## 2019-10-04 NOTE — Therapy (Signed)
[facility] [address] [place], [place], [address] Phone: [phone]   Fax:  [phone]  Physical Therapy Evaluation  Patient Details  Name: Sabrina Castaneda MRN: [id number] Date of Birth: 1935/08/25 Referring Provider (PT): Lavada Mesi, MD   Encounter Date: 10/04/2019   PT End of Session - 10/04/19 1526    Visit Number 1    Date for PT Re-Evaluation 11/29/19    Authorization Type Medicare    PT Start Time 1446    PT Stop Time 1527    PT Time Calculation (min) 41 min    Activity Tolerance Patient tolerated treatment well    Behavior During Therapy [facility] for tasks assessed/performed           Past Medical History:  Diagnosis Date  . Allergy   . GERD (gastroesophageal reflux disease)   . History of chicken pox   . Hyperlipidemia   . Scoliosis   . Thyroid disease     Past Surgical History:  Procedure Laterality Date  . ABDOMINAL HYSTERECTOMY  1980  . HERNIA REPAIR  02/04/1966  . REPLACEMENT TOTAL KNEE Right   . WRIST FRACTURE SURGERY Bilateral     There were no vitals filed for this visit.    Subjective Assessment - 10/04/19 1447    Subjective Pt presents with Rt scapular and thoracic pain that began 2-3 weeks ago without incident or injury.  Recent x-ray showed scoliosis with Rt convexity.  Pt reports increased pain with sleep at night due to inabiltiy to sleep on Rt shoulder.    Pertinent History Rt total knee replacement, scoliosis    Limitations Walking;Sitting    How long can you sit comfortably? burning pain in Lt thoracic spine: 1 hour limitation    How long can you walk comfortably? 20 minutes- 1 year history due to LBP and Rt hip pain    Diagnostic tests x-ray of thoracic spine: scoliosis    Patient Stated Goals reduce Rt shoulder and thoracic/lumbar pain    Currently in Pain? Yes    Pain Score 3    up to 7-8/10 max   Pain Location Thoracic    Pain Orientation Left;Right    Pain  Descriptors / Indicators Burning;Sore    Pain Type Chronic pain    Pain Onset More than a month ago    Pain Frequency Constant    Aggravating Factors  sleep on Rt side, walking, sitting    Pain Relieving Factors Tylenol, certain positions              [facility] PT Assessment - 10/04/19 0001      Assessment   Medical Diagnosis pain in thoracic spine    Referring Provider (PT) Hilts, Michael, MD    Onset Date/Surgical Date 08/04/19    Next MD Visit none    Prior Therapy none      Precautions   Precautions None      Restrictions   Weight Bearing Restrictions No      Balance Screen   Has the patient fallen in the past 6 months No    Has the patient had a decrease in activity level because of a fear of falling?  No    Is the patient reluctant to leave their home because of a fear of falling?  No      Home Environment   Living Environment Assisted living    Home Equipment None    Additional Comments independent at [employer]  Prior Function   Level of Independence Independent    Vocation Retired    Leisure walk dog, pain, read, crossword puzzles, play cards      Cognition   Overall Cognitive Status Within Functional Limits for tasks assessed      Observation/Other Assessments   Focus on Therapeutic Outcomes (FOTO)  54% limitation      Posture/Postural Control   Posture/Postural Control Postural limitations    Postural Limitations Forward head;Rounded Shoulders;Increased thoracic kyphosis    Posture Comments scoliosis with Rt convexity and alignment associated with this      ROM / Strength   AROM / PROM / Strength AROM;PROM;Strength      AROM   Overall AROM  Deficits    Overall AROM Comments cervical sidebending limited by 25% bilaterally.  Rt=Lt UE A/ROM with stiffness/pain reported on the Rt.        PROM   Overall PROM  Within functional limits for tasks performed      Strength   Overall Strength Within functional limits for tasks performed      Palpation     Spinal mobility global reduction in spinal mobility    Palpation comment tension/trigger points Rt and Lt upper traps, Lt scapular trigger points  and bil rhomboid tension      Transfers   Transfers Independent with all Transfers      Ambulation/Gait   Ambulation/Gait Yes    Gait Pattern Trunk flexed;Wide base of support;Antalgic                      Objective measurements completed on examination: See above findings.               PT Education - 10/04/19 1525    Education Details Access Code: [email or web address]    Person(s) Educated Patient    Methods Explanation;Demonstration;Handout    Comprehension Verbalized understanding;Returned demonstration            PT Short Term Goals - 10/04/19 1458      PT SHORT TERM GOAL #1   Title be independent in initial HEP    Time 4    Period Weeks    Status New    Target Date 11/01/19      PT SHORT TERM GOAL #2   Title report a 30% reduction in Rt shoulder and thoracic pain with sitting and standing    Time 4    Period Weeks    Status New    Target Date 11/01/19             PT Long Term Goals - 10/04/19 1459      PT LONG TERM GOAL #1   Title be independent in advanced HEP    Time 8    Period Weeks    Status New    Target Date 11/29/19      PT LONG TERM GOAL #2   Title reduce FOTO to < or = to    Time 8    Period Weeks    Status New    Target Date 11/29/19      PT LONG TERM GOAL #3   Title report a 60% reduction in Rt shoulder and thoracic pain with sitting and standing    Time 8    Period Weeks    Status New    Target Date 11/29/19      PT LONG TERM GOAL #4   Title sleep without limitation due to Rt shoulder pain  Time 8    Period Weeks    Status New    Target Date 11/29/19      PT LONG TERM GOAL #5   Title verbalize and demonstate postural corrections and change of postion with daily activity to reduce pain and improve alignment    Time 8    Period Weeks    Status New    Target  Date 11/29/19                  Plan - 10/04/19 1536    Clinical Impression Statement Pt presents to PT with thoracic pain, Rt shoulder and Lt scapular pain.  Recent x-ray showed scoliosis with Rt convexity and degenerative changes in the thoracic and lumbar spine associated with scoliosis.  Pt reports that pain began ~2 months ago without cause.  Pt reports 3-8/10 pain in the Rt shoulder and Lt scapula that is worse with sleep on the Rt side, walking and sitting long periods.  Pt with postural asymmetries consistent with thoracic scoliosis with Rt convexity.  Pt with forward head and scapular protraction.  Pt with full Rt=Lt shoulder mobility with Rt shoulder stiffness and discomfort reported at end range flexion.  Palpable tenderness and trigger points in bil upper traps, bil rhomboids and Lt medial scapular border.  Pt elevates at the scapula bilaterally with movement of the upper extremities and with static sitting.  Pt is able to correct with verbal cues.  Pt will benefit from skilled PT to address postural asymmetries, shoulder flexibility, postural strength and tissue mobilization to reduce tension and trigger points.    Personal Factors and Comorbidities Age;Comorbidity 2    Comorbidities scoliosis, Rt TKA    Examination-Activity Limitations Locomotion Level;Sit;Sleep    Examination-Participation Restrictions Meal Prep;Community Activity    Stability/Clinical Decision Making Evolving/Moderate complexity    Clinical Decision Making Moderate    Rehab Potential Good    PT Frequency 2x / week    PT Duration 8 weeks    PT Treatment/Interventions ADLs/Self Care Home Management;Cryotherapy;Electrical Stimulation;Moist Heat;Functional mobility training;Therapeutic activities;Therapeutic exercise;Neuromuscular re-education;Manual techniques;Patient/family education;Passive range of motion;Dry needling;Taping    PT Next Visit Plan Dry needling to bil upper traps, rhomboids, Lt scapular region.   Postural strength: standing extension and rows, postural education. Decompression    PT Home Exercise Plan Access Code: [email or web address]    Consulted and Agree with Plan of Care Patient           Patient will benefit from skilled therapeutic intervention in order to improve the following deficits and impairments:  Decreased activity tolerance, Postural dysfunction, Improper body mechanics, Impaired flexibility, Pain, Increased muscle spasms, Decreased range of motion, Difficulty walking  Visit Diagnosis: Pain in thoracic spine - Plan: PT plan of care cert/re-cert  Abnormal posture - Plan: PT plan of care cert/re-cert  Cramp and spasm - Plan: PT plan of care cert/re-cert  Chronic right shoulder pain - Plan: PT plan of care cert/re-cert     Problem List Patient Active Problem List   Diagnosis Date Noted  . Physical exam 09/10/2019  . Hyperlipidemia 08/16/2019  . Hypothyroid 08/16/2019  . Diplopia 03/09/2012  . Female stress incontinence 12/05/2010    Lorrene Reid, PT 10/04/19 4:14 PM  [facility] [address], [address] [place], [place], [address] Phone: [phone]   Fax:  [phone]  Name: Sabrina Castaneda MRN: [id number] Date of Birth: February 12, 1935

## 2019-10-06 ENCOUNTER — Encounter: Payer: Self-pay | Admitting: Physical Therapy

## 2019-10-06 ENCOUNTER — Other Ambulatory Visit: Payer: Self-pay

## 2019-10-06 ENCOUNTER — Ambulatory Visit: Payer: Medicare Other | Attending: Family Medicine | Admitting: Physical Therapy

## 2019-10-06 DIAGNOSIS — R293 Abnormal posture: Secondary | ICD-10-CM | POA: Insufficient documentation

## 2019-10-06 DIAGNOSIS — M546 Pain in thoracic spine: Secondary | ICD-10-CM | POA: Diagnosis present

## 2019-10-06 DIAGNOSIS — M25511 Pain in right shoulder: Secondary | ICD-10-CM | POA: Diagnosis present

## 2019-10-06 DIAGNOSIS — R252 Cramp and spasm: Secondary | ICD-10-CM | POA: Insufficient documentation

## 2019-10-06 DIAGNOSIS — G8929 Other chronic pain: Secondary | ICD-10-CM | POA: Diagnosis present

## 2019-10-06 NOTE — Therapy (Signed)
[facility] [address], [address] [place], [place], [address] Phone: [phone]   Fax:  [phone]  Physical Therapy Treatment  Patient Details  Name: Sabrina Castaneda MRN: [id number] Date of Birth: July 24, 1935 Referring Provider (PT): Lavada Mesi, MD   Encounter Date: 10/06/2019   PT End of Session - 10/06/19 1015    Visit Number 2    Date for PT Re-Evaluation 11/29/19    Authorization Type Medicare    PT Start Time 1015    PT Stop Time 1103    PT Time Calculation (min) 48 min    Activity Tolerance Patient tolerated treatment well    Behavior During Therapy [facility] for tasks assessed/performed           Past Medical History:  Diagnosis Date  . Allergy   . GERD (gastroesophageal reflux disease)   . History of chicken pox   . Hyperlipidemia   . Scoliosis   . Thyroid disease     Past Surgical History:  Procedure Laterality Date  . ABDOMINAL HYSTERECTOMY  1980  . HERNIA REPAIR  02/04/1966  . REPLACEMENT TOTAL KNEE Right   . WRIST FRACTURE SURGERY Bilateral     There were no vitals filed for this visit.   Subjective Assessment - 10/06/19 1016    Subjective Pt presents with Rt scapular and thoracic pain that began 2-3 weeks ago without incident or injury.  Recent x-ray showed scoliosis with Rt convexity.  Pt reports increased pain with sleep at night due to inabiltiy to sleep on Rt shoulder.Pt denies pain right now    Pertinent History Rt total knee replacement, scoliosis    Limitations Walking;Sitting    How long can you sit comfortably? burning pain in Lt thoracic spine: 1 hour limitation    How long can you walk comfortably? 20 minutes- 1 year history due to LBP and Rt hip pain    Diagnostic tests x-ray of thoracic spine: scoliosis    Patient Stated Goals reduce Rt shoulder and thoracic/lumbar pain    Currently in Pain? No/denies                             [facility] Adult PT Treatment/Exercise -  10/06/19 0001      Self-Care   Self-Care Other Self-Care Comments    Other Self-Care Comments  MFR with ball to left sub scap area      Exercises   Exercises Neck      Neck Exercises: Theraband   Rows 10 reps;Red    Shoulder External Rotation 10 reps;Red      Neck Exercises: Seated   Other Seated Exercise scap retraction 5 sec x 10    Other Seated Exercise trunk rotation with arms across chest x 5 ea way knees to opp shoulder      Neck Exercises: Supine   Shoulder Flexion Both;10 reps    Shoulder Flexion Limitations painful coming down; stayed in pain free range      Manual Therapy   Manual Therapy Soft tissue mobilization    Soft tissue mobilization to right scapular muscles; left sub scapular/intercostals T8-10 region      Neck Exercises: Stretches   Upper Trapezius Stretch Right;Left;2 reps;20 seconds    Upper Trapezius Stretch Limitations feels burning in left upper back with 2nd rep left                  PT Education - 10/06/19  1107    Education Details HEP; DN education    Person(s) Educated Patient    Methods Explanation;Demonstration;Handout    Comprehension Verbalized understanding;Returned demonstration            PT Short Term Goals - 10/04/19 1458      PT SHORT TERM GOAL #1   Title be independent in initial HEP    Time 4    Period Weeks    Status New    Target Date 11/01/19      PT SHORT TERM GOAL #2   Title report a 30% reduction in Rt shoulder and thoracic pain with sitting and standing    Time 4    Period Weeks    Status New    Target Date 11/01/19             PT Long Term Goals - 10/04/19 1459      PT LONG TERM GOAL #1   Title be independent in advanced HEP    Time 8    Period Weeks    Status New    Target Date 11/29/19      PT LONG TERM GOAL #2   Title reduce FOTO to < or = to    Time 8    Period Weeks    Status New    Target Date 11/29/19      PT LONG TERM GOAL #3   Title report a 60% reduction in Rt shoulder and  thoracic pain with sitting and standing    Time 8    Period Weeks    Status New    Target Date 11/29/19      PT LONG TERM GOAL #4   Title sleep without limitation due to Rt shoulder pain    Time 8    Period Weeks    Status New    Target Date 11/29/19      PT LONG TERM GOAL #5   Title verbalize and demonstate postural corrections and change of postion with daily activity to reduce pain and improve alignment    Time 8    Period Weeks    Status New    Target Date 11/29/19                 Plan - 10/06/19 1107    Clinical Impression Statement Patient tolerated shoulder strengthening well without complaint of pain except for supine OH flexion where she has pain coming down. She did well with manual work to left post thorax distal to left scapula reporting relief at end of session. She had pain in left infraspinatus muscle belly with STW as well. will likely try DN next session.    Personal Factors and Comorbidities Age;Comorbidity 2    Comorbidities scoliosis, Rt TKA    Examination-Activity Limitations Locomotion Level;Sit;Sleep    Examination-Participation Restrictions Meal Prep;Community Activity    PT Treatment/Interventions ADLs/Self Care Home Management;Cryotherapy;Electrical Stimulation;Moist Heat;Functional mobility training;Therapeutic activities;Therapeutic exercise;Neuromuscular re-education;Manual techniques;Patient/family education;Passive range of motion;Dry needling;Taping    PT Next Visit Plan Dry needling to bil upper traps, rhomboids, Lt scapular region.  Postural strength: standing extension and rows, postural education. Decompression    PT Home Exercise Plan Access Code: [email or web address]    Consulted and Agree with Plan of Care Patient           Patient will benefit from skilled therapeutic intervention in order to improve the following deficits and impairments:  Decreased activity tolerance, Postural dysfunction, Improper body mechanics, Impaired flexibility, Pain,  Increased muscle  spasms, Decreased range of motion, Difficulty walking  Visit Diagnosis: Pain in thoracic spine  Abnormal posture  Cramp and spasm  Chronic right shoulder pain     Problem List Patient Active Problem List   Diagnosis Date Noted  . Physical exam 09/10/2019  . Hyperlipidemia 08/16/2019  . Hypothyroid 08/16/2019  . Diplopia 03/09/2012  . Female stress incontinence 12/05/2010    Solon Palm PT 10/06/2019, 11:15 AM  [facility] [address], [address] [place], [place], [address] Phone: [phone]   Fax:  [phone]  Name: Sabrina Castaneda MRN: [id number] Date of Birth: [birth year]

## 2019-10-06 NOTE — Patient Instructions (Signed)
Access Code: [email or web address] URL: [email or web address] Date: 10/06/2019 Prepared by: Raynelle Fanning  Exercises Seated Correct Posture - 5 x daily - 7 x weekly Seated Cervical Sidebending AROM - 3 x daily - 7 x weekly - 1 sets - 3 reps - 20 hold Supine Shoulder Flexion AAROM with Hands Clasped - 3 x daily - 7 x weekly - 1 sets - 10 reps - 10 hold Supine Scapular Retraction - 2 x daily - 7 x weekly Shoulder External Rotation and Scapular Retraction with Resistance - 1 x daily - 7 x weekly - 1-3 sets - 10 reps Standing Row with Anchored Resistance - 1 x daily - 7 x weekly - 1-3 sets - 10 reps  Patient Education Trigger Point Dry Needling

## 2019-10-12 ENCOUNTER — Other Ambulatory Visit: Payer: Self-pay

## 2019-10-12 ENCOUNTER — Encounter: Payer: Self-pay | Admitting: Physical Therapy

## 2019-10-12 ENCOUNTER — Ambulatory Visit: Payer: Medicare Other | Admitting: Physical Therapy

## 2019-10-12 DIAGNOSIS — M546 Pain in thoracic spine: Secondary | ICD-10-CM | POA: Diagnosis not present

## 2019-10-12 DIAGNOSIS — R293 Abnormal posture: Secondary | ICD-10-CM

## 2019-10-12 DIAGNOSIS — R252 Cramp and spasm: Secondary | ICD-10-CM

## 2019-10-12 DIAGNOSIS — M25511 Pain in right shoulder: Secondary | ICD-10-CM

## 2019-10-12 NOTE — Therapy (Signed)
[facility] [address] [place], [place], [address] Phone: [phone]   Fax:  [phone]  Physical Therapy Treatment  Patient Details  Name: Sabrina Castaneda MRN: [id number] Date of Birth: Feb 11, 1935 Referring Provider (PT): Lavada Mesi, MD   Encounter Date: 10/12/2019   PT End of Session - 10/12/19 0851    Visit Number 3    Date for PT Re-Evaluation 11/29/19    Authorization Type Medicare    PT Start Time 0800    PT Stop Time 0848    PT Time Calculation (min) 48 min    Activity Tolerance Patient tolerated treatment well;No increased pain    Behavior During Therapy WFL for tasks assessed/performed           Past Medical History:  Diagnosis Date  . Allergy   . GERD (gastroesophageal reflux disease)   . History of chicken pox   . Hyperlipidemia   . Scoliosis   . Thyroid disease     Past Surgical History:  Procedure Laterality Date  . ABDOMINAL HYSTERECTOMY  1980  . HERNIA REPAIR  02/04/1966  . REPLACEMENT TOTAL KNEE Right   . WRIST FRACTURE SURGERY Bilateral     There were no vitals filed for this visit.   Subjective Assessment - 10/12/19 0802    Subjective Pt states that her Rt shoulder bothered her all night. She didn't sleep well.    Pertinent History Rt total knee replacement, scoliosis    Limitations Walking;Sitting    How long can you sit comfortably? burning pain in Lt thoracic spine: 1 hour limitation    How long can you walk comfortably? 20 minutes- 1 year history due to LBP and Rt hip pain    Diagnostic tests x-ray of thoracic spine: scoliosis    Patient Stated Goals reduce Rt shoulder and thoracic/lumbar pain    Currently in Pain? Other (Comment)   no rating given right now because her pain usually improves as she is up                            [facility] Adult PT Treatment/Exercise - 10/12/19 0001      Neck Exercises: Standing   Other Standing Exercises rows with  yellow TB x10 reps    Other Standing Exercises shoulder ER with yellow TB x10 reps       Neck Exercises: Supine   Other Supine Exercise horizontal abduction yellow TB 2x10 reps     Other Supine Exercise Rt shoulder internal rotation with yellow TB x10 reps       Manual Therapy   Soft tissue mobilization Rt posterior shoulder: infraspinatus,teres minor/major, posterior deltoid             Trigger Point Dry Needling - 10/12/19 0001    Consent Given? Yes    Education Handout Provided Previously provided    Muscles Treated Upper Quadrant Infraspinatus;Teres minor;Teres major    Infraspinatus Response Twitch response elicited;Palpable increased muscle length   Rt    Teres major Response Twitch response elicited;Palpable increased muscle length   Rt    Teres minor Response Twitch response elicited;Palpable increased muscle length   Rt                  PT Short Term Goals - 10/04/19 1458      PT SHORT TERM GOAL #1   Title be independent in initial HEP    Time 4  Period Weeks    Status New    Target Date 11/01/19      PT SHORT TERM GOAL #2   Title report a 30% reduction in Rt shoulder and thoracic pain with sitting and standing    Time 4    Period Weeks    Status New    Target Date 11/01/19             PT Long Term Goals - 10/04/19 1459      PT LONG TERM GOAL #1   Title be independent in advanced HEP    Time 8    Period Weeks    Status New    Target Date 11/29/19      PT LONG TERM GOAL #2   Title reduce FOTO to < or = to    Time 8    Period Weeks    Status New    Target Date 11/29/19      PT LONG TERM GOAL #3   Title report a 60% reduction in Rt shoulder and thoracic pain with sitting and standing    Time 8    Period Weeks    Status New    Target Date 11/29/19      PT LONG TERM GOAL #4   Title sleep without limitation due to Rt shoulder pain    Time 8    Period Weeks    Status New    Target Date 11/29/19      PT LONG TERM GOAL #5   Title  verbalize and demonstate postural corrections and change of postion with daily activity to reduce pain and improve alignment    Time 8    Period Weeks    Status New    Target Date 11/29/19                 Plan - 10/12/19 0851    Clinical Impression Statement Pt did not sleep well last night secondary to Rt posterior shoulder discomfort. She found the massage from last session helpful along the Lt thoracic region. PT reviewed her theraband exercises and decreased resistance to yellow TB secondary to reported difficulty and pain with this at home. Pt was able to complete all exercises without pain following these changes. Pt was agreeable to dry needling end of session. Several twitch responses were elicited and PT complete soft tissue mobilization to the posterior shoulder after this. Will continue with current POC.    Personal Factors and Comorbidities Age;Comorbidity 2    Comorbidities scoliosis, Rt TKA    Examination-Activity Limitations Locomotion Level;Sit;Sleep    Examination-Participation Restrictions Meal Prep;Community Activity    PT Treatment/Interventions ADLs/Self Care Home Management;Cryotherapy;Electrical Stimulation;Moist Heat;Functional mobility training;Therapeutic activities;Therapeutic exercise;Neuromuscular re-education;Manual techniques;Patient/family education;Passive range of motion;Dry needling;Taping    PT Next Visit Plan f/u on dn posterior shoulder and complete more as needed; shoulder strength- rotation and flexion progression; Postural strength: standing extension and rows, postural education. Decompression    PT Home Exercise Plan Access Code: [email or web address]    Consulted and Agree with Plan of Care Patient           Patient will benefit from skilled therapeutic intervention in order to improve the following deficits and impairments:  Decreased activity tolerance, Postural dysfunction, Improper body mechanics, Impaired flexibility, Pain, Increased muscle spasms,  Decreased range of motion, Difficulty walking  Visit Diagnosis: Pain in thoracic spine  Abnormal posture  Cramp and spasm  Chronic right shoulder pain     Problem List Patient Active  Problem List   Diagnosis Date Noted  . Physical exam 09/10/2019  . Hyperlipidemia 08/16/2019  . Hypothyroid 08/16/2019  . Diplopia 03/09/2012  . Female stress incontinence 12/05/2010    8:56 AM,10/12/19 Donita Brooks PT, DPT [facility]  [phone]  [facility] [address] [place], [place], [address] Phone: [phone]   Fax:  [phone]  Name: Sabrina Castaneda MRN: [id number] Date of Birth: July 19, 1935

## 2019-10-14 ENCOUNTER — Ambulatory Visit: Payer: Medicare Other | Admitting: Physical Therapy

## 2019-10-14 ENCOUNTER — Other Ambulatory Visit: Payer: Self-pay

## 2019-10-14 DIAGNOSIS — G8929 Other chronic pain: Secondary | ICD-10-CM

## 2019-10-14 DIAGNOSIS — M546 Pain in thoracic spine: Secondary | ICD-10-CM | POA: Diagnosis not present

## 2019-10-14 DIAGNOSIS — R293 Abnormal posture: Secondary | ICD-10-CM

## 2019-10-14 DIAGNOSIS — R252 Cramp and spasm: Secondary | ICD-10-CM

## 2019-10-14 NOTE — Therapy (Signed)
[facility] [address], [address] [place], [place], [address] Phone: [phone]   Fax:  [phone]  Physical Therapy Treatment  Patient Details  Name: Sabrina Castaneda MRN: [id number] Date of Birth: 1935/11/29 Referring Provider (PT): Lavada Mesi, MD   Encounter Date: 10/14/2019   PT End of Session - 10/14/19 0909    Visit Number 4    Date for PT Re-Evaluation 11/29/19    Authorization Type Medicare    PT Start Time 0804    PT Stop Time 0850    PT Time Calculation (min) 46 min    Activity Tolerance Patient tolerated treatment well           Past Medical History:  Diagnosis Date  . Allergy   . GERD (gastroesophageal reflux disease)   . History of chicken pox   . Hyperlipidemia   . Scoliosis   . Thyroid disease     Past Surgical History:  Procedure Laterality Date  . ABDOMINAL HYSTERECTOMY  1980  . HERNIA REPAIR  02/04/1966  . REPLACEMENT TOTAL KNEE Right   . WRIST FRACTURE SURGERY Bilateral     There were no vitals filed for this visit.   Subjective Assessment - 10/14/19 0807    Subjective I'm doing the best I can with the exercises.  I'm optimistic that the Dn will help.  I was able to garden for a while afterwards without pain.  The massage helped for 2 days on a previous visit.    Pertinent History Rt total knee replacement, scoliosis    Patient Stated Goals reduce Rt shoulder and thoracic/lumbar pain    Currently in Pain? No/denies    Pain Score 0-No pain                             [facility] Adult PT Treatment/Exercise - 10/14/19 0001      Neck Exercises: Standing   Other Standing Exercises rows with yellow TB x10 reps    Other Standing Exercises shoulder ER with yellow TB x10 reps       Neck Exercises: Seated   Other Seated Exercise attempted thoracic extension with small blue ball aggravated left lower thoracic pain so discontinued      Shoulder Exercises: Standing   Other  Standing Exercises yellow band bil shoulder extensions 10x       Moist Heat Therapy   Number Minutes Moist Heat 3 Minutes    Moist Heat Location --   thoracic region     Manual Therapy   Manual therapy comments Addaday to right posterior shoulder and posterior upper arm     Soft tissue mobilization left lower thoracic paraspinals, lats, lower traps with and without Addaday instrument assist (yellow attachment)             Trigger Point Dry Needling - 10/14/19 0001    Consent Given? Yes    Dry Needling Comments left lower thoracic paraspinals fanning technique very shallow                  PT Short Term Goals - 10/04/19 1458      PT SHORT TERM GOAL #1   Title be independent in initial HEP    Time 4    Period Weeks    Status New    Target Date 11/01/19      PT SHORT TERM GOAL #2   Title report a 30% reduction in Rt shoulder  and thoracic pain with sitting and standing    Time 4    Period Weeks    Status New    Target Date 11/01/19             PT Long Term Goals - 10/04/19 1459      PT LONG TERM GOAL #1   Title be independent in advanced HEP    Time 8    Period Weeks    Status New    Target Date 11/29/19      PT LONG TERM GOAL #2   Title reduce FOTO to < or = to    Time 8    Period Weeks    Status New    Target Date 11/29/19      PT LONG TERM GOAL #3   Title report a 60% reduction in Rt shoulder and thoracic pain with sitting and standing    Time 8    Period Weeks    Status New    Target Date 11/29/19      PT LONG TERM GOAL #4   Title sleep without limitation due to Rt shoulder pain    Time 8    Period Weeks    Status New    Target Date 11/29/19      PT LONG TERM GOAL #5   Title verbalize and demonstate postural corrections and change of postion with daily activity to reduce pain and improve alignment    Time 8    Period Weeks    Status New    Target Date 11/29/19                 Plan - 10/14/19 0909    Clinical Impression  Statement The patient's primary complaint today is left lower thoracic pain.  No complaints of right shoulder pain which was more of an issue last visit (good response to DN of this area).  She reports pain produced in this area with standing band ex's and aggravated with sitting with the ball behind this area.  No improvement with soft tissue mobilization with and without instrument assistance.  She is receptive to DN in the thoracic paraspinal muscles with muscle twitches produced (good prognostic indicator).  Therapist monitoring response with all treatment interventions.    Comorbidities scoliosis, Rt TKA    Examination-Activity Limitations Locomotion Level;Sit;Sleep    Rehab Potential Good    PT Frequency 2x / week    PT Duration 8 weeks    PT Treatment/Interventions ADLs/Self Care Home Management;Cryotherapy;Electrical Stimulation;Moist Heat;Functional mobility training;Therapeutic activities;Therapeutic exercise;Neuromuscular re-education;Manual techniques;Patient/family education;Passive range of motion;Dry needling;Taping    PT Next Visit Plan assess response to DN of left thoracic region;  Dn of right shoulder region as needed;  postural strength    PT Home Exercise Plan Access Code: [email or web address]           Patient will benefit from skilled therapeutic intervention in order to improve the following deficits and impairments:  Decreased activity tolerance, Postural dysfunction, Improper body mechanics, Impaired flexibility, Pain, Increased muscle spasms, Decreased range of motion, Difficulty walking  Visit Diagnosis: Pain in thoracic spine  Abnormal posture  Cramp and spasm  Chronic right shoulder pain     Problem List Patient Active Problem List   Diagnosis Date Noted  . Physical exam 09/10/2019  . Hyperlipidemia 08/16/2019  . Hypothyroid 08/16/2019  . Diplopia 03/09/2012  . Female stress incontinence 12/05/2010   Lavinia Sharps, PT 10/14/19 9:23 AM Phone: [phone] Fax:  [phone] Lavinia Sharps  C 10/14/2019, 9:22 AM  [facility] [address] [place], [place], [address] Phone: [phone]   Fax:  [phone]  Name: Sabrina Castaneda MRN: [id number] Date of Birth: 1935-05-28

## 2019-10-19 ENCOUNTER — Encounter: Payer: Self-pay | Admitting: Physical Therapy

## 2019-10-19 ENCOUNTER — Ambulatory Visit: Payer: Medicare Other | Admitting: Physical Therapy

## 2019-10-19 ENCOUNTER — Other Ambulatory Visit: Payer: Self-pay

## 2019-10-19 DIAGNOSIS — M546 Pain in thoracic spine: Secondary | ICD-10-CM

## 2019-10-19 DIAGNOSIS — M25511 Pain in right shoulder: Secondary | ICD-10-CM

## 2019-10-19 DIAGNOSIS — R252 Cramp and spasm: Secondary | ICD-10-CM

## 2019-10-19 DIAGNOSIS — R293 Abnormal posture: Secondary | ICD-10-CM

## 2019-10-19 NOTE — Therapy (Addendum)
[facility] [address], [place], [place], [address] Phone: [phone]   Fax:  [phone]  Physical Therapy Treatment  Patient Details  Name: Sabrina Castaneda MRN: [id number] Date of Birth: [birth year] Referring Provider (PT): Eunice Blase, MD   Encounter Date: 10/19/2019   PT End of Session - 10/19/19 1448    Visit Number 5    Date for PT Re-Evaluation 11/29/19    Authorization Type Medicare    PT Start Time 1401    PT Stop Time 4034    PT Time Calculation (min) 44 min    Activity Tolerance Patient tolerated treatment well;No increased pain    Behavior During Therapy WFL for tasks assessed/performed           Past Medical History:  Diagnosis Date  . Allergy   . GERD (gastroesophageal reflux disease)   . History of chicken pox   . Hyperlipidemia   . Scoliosis   . Thyroid disease     Past Surgical History:  Procedure Laterality Date  . ABDOMINAL HYSTERECTOMY  1980  . HERNIA REPAIR  02/04/1966  . REPLACEMENT TOTAL KNEE Right   . WRIST FRACTURE SURGERY Bilateral     There were no vitals filed for this visit.   Subjective Assessment - 10/19/19 1406    Subjective Pt states that her back is improving, and it is not as often during the day. Her Rt shoulder bothers her more in the morning.    Pertinent History Rt total knee replacement, scoliosis    Patient Stated Goals reduce Rt shoulder and thoracic/lumbar pain    Currently in Pain? No/denies                             [facility] Adult PT Treatment/Exercise - 10/19/19 0001      Exercises   Exercises Other Exercises    Other Exercises  Nustep seat 7 L1 x4 min PT discussing nustep for home       Neck Exercises: Supine   Shoulder Flexion Both;15 reps    Shoulder Flexion Weights (lbs) holding SPC avoiding end range of flexion secondary to pain     Other Supine Exercise scap retraction x10 reps     Other Supine Exercise horizontal  abduction yellow TB x12 reps       Neck Exercises: Sidelying   Other Sidelying Exercise Rt shoulder ER x10 reps, Rt horizontal abduction x10 reps-VC to retract scapula      Manual Therapy   Soft tissue mobilization STM Rt biceps, Rt deltoid, Rt infraspinatus                     PT Short Term Goals - 10/04/19 1458      PT SHORT TERM GOAL #1   Title be independent in initial HEP    Time 4    Period Weeks    Status New    Target Date 11/01/19      PT SHORT TERM GOAL #2   Title report a 30% reduction in Rt shoulder and thoracic pain with sitting and standing    Time 4    Period Weeks    Status New    Target Date 11/01/19             PT Long Term Goals - 10/04/19 1459      PT LONG TERM GOAL #1   Title be independent in advanced HEP  Time 8    Period Weeks    Status New    Target Date 11/29/19      PT LONG TERM GOAL #2   Title reduce FOTO to < or = to    Time 8    Period Weeks    Status New    Target Date 11/29/19      PT LONG TERM GOAL #3   Title report a 60% reduction in Rt shoulder and thoracic pain with sitting and standing    Time 8    Period Weeks    Status New    Target Date 11/29/19      PT LONG TERM GOAL #4   Title sleep without limitation due to Rt shoulder pain    Time 8    Period Weeks    Status New    Target Date 11/29/19      PT LONG TERM GOAL #5   Title verbalize and demonstate postural corrections and change of postion with daily activity to reduce pain and improve alignment    Time 8    Period Weeks    Status New    Target Date 11/29/19                 Plan - 10/19/19 1447    Clinical Impression Statement Pt is making improvements in Rt UE use. She is noticing less grimacing when reaching overhead into her cabinet. Session focused on increasing scapula strength and shoulder external rotation. Pt had palpable muscle spasm in the Rt biceps and deltoid, so PT completed soft tissue mobilization to the area. Pt felt this  was helpful. Pt has access to a Nustep at her living community, and PT encouraged her to utilize this on her days she does not come to PT.    Comorbidities scoliosis, Rt TKA    Examination-Activity Limitations Locomotion Level;Sit;Sleep    Rehab Potential Good    PT Frequency 2x / week    PT Duration 8 weeks    PT Treatment/Interventions ADLs/Self Care Home Management;Cryotherapy;Electrical Stimulation;Moist Heat;Functional mobility training;Therapeutic activities;Therapeutic exercise;Neuromuscular re-education;Manual techniques;Patient/family education;Passive range of motion;Dry needling;Taping    PT Next Visit Plan Dn of right shoulder region and biceps as needed;  postural strength    PT Home Exercise Plan Access Code: [email or web address]           Patient will benefit from skilled therapeutic intervention in order to improve the following deficits and impairments:  Decreased activity tolerance, Postural dysfunction, Improper body mechanics, Impaired flexibility, Pain, Increased muscle spasms, Decreased range of motion, Difficulty walking  Visit Diagnosis: Pain in thoracic spine  Abnormal posture  Cramp and spasm  Chronic right shoulder pain     Problem List Patient Active Problem List   Diagnosis Date Noted  . Physical exam 09/10/2019  . Hyperlipidemia 08/16/2019  . Hypothyroid 08/16/2019  . Diplopia 03/09/2012  . Female stress incontinence 12/05/2010    4:29 PM,10/19/19 Sherol Dade PT, DPT [place] PHYSICAL THERAPY DISCHARGE SUMMARY  Visits from Start of Care: 5  Current functional level related to goals / functional outcomes: See above for current status.  Pt didn't return to PT.     Remaining deficits: See above.     Education / Equipment: HEP Plan: Patient agrees to discharge.  Patient goals were not met. Patient is being discharged due to not returning since the last visit.  ?????        Sigurd Sos, PT  01/04/20 12:58 PM   [facility] [address] [place], [place], [address] Phone: [phone]   Fax:  [phone]  Name: Sabrina Castaneda MRN: [id number] Date of Birth: [birth year]

## 2019-11-04 ENCOUNTER — Ambulatory Visit: Payer: Self-pay

## 2019-11-04 ENCOUNTER — Other Ambulatory Visit: Payer: Self-pay

## 2019-11-04 ENCOUNTER — Encounter: Payer: Self-pay | Admitting: Family Medicine

## 2019-11-04 ENCOUNTER — Ambulatory Visit (INDEPENDENT_AMBULATORY_CARE_PROVIDER_SITE_OTHER): Payer: Medicare Other | Admitting: Family Medicine

## 2019-11-04 DIAGNOSIS — M25511 Pain in right shoulder: Secondary | ICD-10-CM | POA: Diagnosis not present

## 2019-11-04 DIAGNOSIS — G8929 Other chronic pain: Secondary | ICD-10-CM

## 2019-11-04 NOTE — Progress Notes (Signed)
Same pain down the right arm   Remembered she had a fall 3 years ago

## 2019-11-04 NOTE — Progress Notes (Signed)
Office Visit Note   Patient: Sabrina Castaneda           Date of Birth: Aug 05, 1935           MRN: [id number] Visit Date: 11/04/2019 Requested by: Sheliah Hatch, MD [address] [place],  [place] [address] PCP: Sheliah Hatch, MD  Subjective: Chief Complaint  Patient presents with  . Right Arm - Pain    HPI: She is here with worsening right arm pain.  Physical therapy unfortunately does not seem to be helping.  Her pain seems to be localizing to the shoulder.  She does recall falling a few years ago and has had intermittent pain since then, but it seems to be much worse lately.  It keeps her awake at night sometimes.  It throbs unless she gets up and moves around.  Denies any neck pain, denies any numbness or tingling.              ROS:   All other systems were reviewed and are negative.  Objective: Vital Signs: There were no vitals taken for this visit.  Physical Exam:  General:  Alert and oriented, in no acute distress. Pulm:  Breathing unlabored. Psy:  Normal mood, congruent affect.  Right shoulder: She has full active range of motion but pain at the extreme of overhead reach and behind the back reach.  She has palpable crepitus in the shoulder with active range of motion.  She has pain with empty can test but her rotator cuff strength is still 5/5.  Speeds test is negative, no tenderness at the [facility] joint or over the long head biceps tendon.  Moderate tenderness in the posterior subacromial space.  Imaging: XR Shoulder Right  Result Date: 11/04/2019 X-rays of the right shoulder reveal mild AC joint narrowing with inferior spurring at the distal clavicle.  Glenohumeral joint looks good, no soft tissue calcifications, no sign of AVN or neoplasm.   Assessment & Plan: 1.  Chronic right shoulder pain, suspicious for partial rotator cuff tear.  Cannot rule out cervical radiculopathy. -Discussed options with patient and she would like to proceed with MRI scan of the  shoulder.  If negative for rotator cuff tear, could contemplate subacromial injection.  If that did not help, then we will evaluate for cervical radiculopathy.     Procedures: No procedures performed  No notes on file     PMFS History: Patient Active Problem List   Diagnosis Date Noted  . Physical exam 09/10/2019  . Hyperlipidemia 08/16/2019  . Hypothyroid 08/16/2019  . Diplopia 03/09/2012  . Female stress incontinence 12/05/2010   Past Medical History:  Diagnosis Date  . Allergy   . GERD (gastroesophageal reflux disease)   . History of chicken pox   . Hyperlipidemia   . Scoliosis   . Thyroid disease     History reviewed. No pertinent family history.  Past Surgical History:  Procedure Laterality Date  . ABDOMINAL HYSTERECTOMY  1980  . HERNIA REPAIR  02/04/1966  . REPLACEMENT TOTAL KNEE Right   . WRIST FRACTURE SURGERY Bilateral    Social History   Occupational History  . Not on file  Tobacco Use  . Smoking status: Never Smoker  . Smokeless tobacco: Never Used  Vaping Use  . Vaping Use: Never used  Substance and Sexual Activity  . Alcohol use: Yes    Comment: occasionally  . Drug use: Never  . Sexual activity: Not Currently

## 2019-11-08 ENCOUNTER — Other Ambulatory Visit: Payer: Self-pay | Admitting: Family Medicine

## 2019-11-10 ENCOUNTER — Encounter: Payer: Medicare Other | Admitting: Physical Therapy

## 2019-11-29 ENCOUNTER — Ambulatory Visit
Admission: RE | Admit: 2019-11-29 | Discharge: 2019-11-29 | Disposition: A | Discharge status: Home / Self Care | Payer: Medicare Other | Source: Ambulatory Visit | Attending: Family Medicine | Admitting: Family Medicine

## 2019-11-29 DIAGNOSIS — G8929 Other chronic pain: Secondary | ICD-10-CM

## 2019-11-30 ENCOUNTER — Telehealth: Payer: Self-pay | Admitting: Family Medicine

## 2019-11-30 NOTE — Telephone Encounter (Signed)
MRI shows rotator cuff irritation/tendinopathy with partial tearing.  This will often heal without surgery, but not always.  It is worthwhile to consider a cortisone injection prior to considering surgery.

## 2019-12-02 ENCOUNTER — Encounter: Payer: Self-pay | Admitting: Family Medicine

## 2019-12-02 ENCOUNTER — Ambulatory Visit: Payer: Self-pay

## 2019-12-02 ENCOUNTER — Other Ambulatory Visit: Payer: Self-pay

## 2019-12-02 ENCOUNTER — Encounter: Payer: Medicare Other | Admitting: Physical Therapy

## 2019-12-02 ENCOUNTER — Ambulatory Visit (INDEPENDENT_AMBULATORY_CARE_PROVIDER_SITE_OTHER): Payer: Medicare Other | Admitting: Family Medicine

## 2019-12-02 DIAGNOSIS — G8929 Other chronic pain: Secondary | ICD-10-CM

## 2019-12-02 DIAGNOSIS — M25511 Pain in right shoulder: Secondary | ICD-10-CM | POA: Diagnosis not present

## 2019-12-02 NOTE — Progress Notes (Signed)
ult

## 2019-12-02 NOTE — Progress Notes (Signed)
Office Visit Note   Patient: Sabrina Castaneda           Date of Birth: 26-Jun-1935           MRN: [id number] Visit Date: 12/02/2019 Requested by: Sheliah Hatch, MD [address] [place],  [place] [address] PCP: Sheliah Hatch, MD  Subjective: Chief Complaint  Patient presents with   Right Shoulder - Pain    Post MRI - considering the cortisone injection. Pain has worsened to the point that it wakes her several times at night.    HPI: She is here for follow-up right shoulder pain with rotator cuff tendinopathy and small full-thickness tear of the anterior supraspinatus.  She is having pain keeping her awake at night.  She would really like to avoid surgery if possible.  She is contemplating an injection.                ROS:   All other systems were reviewed and are negative.  Objective: Vital Signs: There were no vitals taken for this visit.  Physical Exam:  General:  Alert and oriented, in no acute distress. Pulm:  Breathing unlabored. Psy:  Normal mood, congruent affect.  Right shoulder: She has slightly decreased overhead reach and behind the back reach compared to the left.  Rotator cuff strength is still 5/5 throughout.  Imaging: US Guided Needle Placement - No Linked Charges  Result Date: 12/02/2019 Ultrasound guided injection is preferred based studies that show increased duration, increased effect, greater accuracy, decreased procedural pain, increased response rate, and decreased cost with ultrasound guided versus blind injection.   Verbal informed consent obtained.  Time-out conducted.  Noted no overlying erythema, induration, or other signs of local infection. Ultrasound-guided right glenohumeral injection: After sterile prep with Betadine, injected 8 cc 1% lidocaine without epinephrine and 40 mg methylprednisolone using a 22-gauge spinal needle, passing the needle from posterior approach into the glenohumeral joint.  Injectate seen filling the joint capsule.   She had good immediate relief.    Assessment & Plan: 1.  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  Follow-up as needed.  She will call me if she needs stronger pain medicine, but she did not want anything more than Tylenol.     Procedures: No procedures performed  No notes on file     PMFS History: Patient Active Problem List   Diagnosis Date Noted   Physical exam 09/10/2019   Hyperlipidemia 08/16/2019   Hypothyroid 08/16/2019   Diplopia 03/09/2012   Female stress incontinence 12/05/2010   Past Medical History:  Diagnosis Date   Allergy    GERD (gastroesophageal reflux disease)    History of chicken pox    Hyperlipidemia    Scoliosis    Thyroid disease     History reviewed. No pertinent family history.  Past Surgical History:  Procedure Laterality Date   ABDOMINAL HYSTERECTOMY  1980   HERNIA REPAIR  02/04/1966   REPLACEMENT TOTAL KNEE Right    WRIST FRACTURE SURGERY Bilateral    Social History   Occupational History   Not on file  Tobacco Use   Smoking status: Never Smoker   Smokeless tobacco: Never Used  Vaping Use   Vaping Use: Never used  Substance and Sexual Activity   Alcohol use: Yes    Comment: occasionally   Drug use: Never   Sexual activity: Not Currently

## 2019-12-07 ENCOUNTER — Encounter: Payer: Medicare Other | Admitting: Physical Therapy

## 2019-12-09 ENCOUNTER — Encounter: Payer: Medicare Other | Admitting: Physical Therapy

## 2019-12-23 ENCOUNTER — Encounter: Payer: Self-pay | Admitting: Physician Assistant

## 2019-12-23 ENCOUNTER — Ambulatory Visit (INDEPENDENT_AMBULATORY_CARE_PROVIDER_SITE_OTHER): Payer: Medicare Other | Admitting: Physician Assistant

## 2019-12-23 ENCOUNTER — Other Ambulatory Visit: Payer: Self-pay

## 2019-12-23 VITALS — BP 124/68 | HR 84 | Temp 98.7°F | Resp 17 | Ht 63.0 in | Wt 152.2 lb

## 2019-12-23 DIAGNOSIS — R300 Dysuria: Secondary | ICD-10-CM | POA: Diagnosis not present

## 2019-12-23 LAB — POCT URINALYSIS DIPSTICK
Bilirubin, UA: NEGATIVE
Glucose, UA: NEGATIVE
Ketones, UA: NEGATIVE
Nitrite, UA: NEGATIVE
Protein, UA: NEGATIVE
Spec Grav, UA: 1.015 (ref 1.010–1.025)
Urobilinogen, UA: 0.2 E.U./dL
pH, UA: 7 (ref 5.0–8.0)

## 2019-12-23 MED ORDER — CEPHALEXIN 500 MG PO CAPS: 500.0000 mg | ORAL_CAPSULE | Freq: Two times a day (BID) | ORAL | 0 refills | Status: AC

## 2019-12-23 NOTE — Patient Instructions (Signed)
Your symptoms are consistent with a bladder infection, also called acute cystitis. Please take your antibiotic (Keflex) as directed until all pills are gone.  Stay very well hydrated.  Consider a daily probiotic (Align, Culturelle, or Activia) to help prevent stomach upset caused by the antibiotic.  Taking a probiotic daily may also help prevent recurrent UTIs.  Also consider taking AZO (Phenazopyridine) tablets to help decrease pain with urination.  I will call you with your urine testing results.  We will change antibiotics if indicated.  Call or return to clinic if symptoms are not resolved by completion of antibiotic.   Urinary Tract Infection A urinary tract infection (UTI) can occur any place along the urinary tract. The tract includes the kidneys, ureters, bladder, and urethra. A type of germ called bacteria often causes a UTI. UTIs are often helped with antibiotic medicine.  HOME CARE   If given, take antibiotics as told by your doctor. Finish them even if you start to feel better.  Drink enough fluids to keep your pee (urine) clear or pale yellow.  Avoid tea, drinks with caffeine, and bubbly (carbonated) drinks.  Pee often. Avoid holding your pee in for a long time.  Pee before and after having sex (intercourse).  Wipe from front to back after you poop (bowel movement) if you are a woman. Use each tissue only once. GET HELP RIGHT AWAY IF:   You have back pain.  You have lower belly (abdominal) pain.  You have chills.  You feel sick to your stomach (nauseous).  You throw up (vomit).  Your burning or discomfort with peeing does not go away.  You have a fever.  Your symptoms are not better in 3 days. MAKE SURE YOU:   Understand these instructions.  Will watch your condition.  Will get help right away if you are not doing well or get worse. Document Released: 07/10/2007 Document Revised: 10/16/2011 Document Reviewed: 08/22/2011 Exit[employer] Patient Information 2015  [employer]. This information is not intended to replace advice given to you by your health care provider. Make sure you discuss any questions you have with your health care provider.   

## 2019-12-23 NOTE — Progress Notes (Signed)
Patient presents to clinic today c/o symptoms of a possible UTI.  Patient with history of stress incontinence/OAB, currently on a regimen of Ditropan XL 10 mg daily.  Endorses over the past day noting urinary urgency and frequency. Also noting dysuria and urinary hesitancy.  Denies fever, chills, nausea or vomiting. Denies back pain or abdominal pain. Has history of UTI. States this is similar to prior episodes. .  Past Medical History:  Diagnosis Date  . Allergy   . GERD (gastroesophageal reflux disease)   . History of chicken pox   . Hyperlipidemia   . Scoliosis   . Thyroid disease     Current Outpatient Medications on File Prior to Visit  Medication Sig Dispense Refill  . aspirin 81 MG EC tablet Take by mouth.    Marland Kitchen atorvastatin (LIPITOR) 40 MG tablet Take 40 mg by mouth daily.    . Cholecalciferol 50 MCG (2000 UT) CAPS TAKE 2 CAPSULES BY MOUTH EVERY DAY    . diclofenac Sodium (VOLTAREN) 1 % GEL Apply 4 g topically 4 (four) times daily as needed. 500 g 6  . levothyroxine (SYNTHROID) 75 MCG tablet Take 75 mcg by mouth daily.    Marland Kitchen loratadine (CLARITIN) 10 MG tablet Take by mouth.    Marland Kitchen omeprazole (PRILOSEC) 40 MG capsule TAKE 1 CAPSULE BY MOUTH EVERY DAY 90 capsule 0  . oxybutynin (DITROPAN-XL) 10 MG 24 hr tablet Take 10 mg by mouth daily.     No current facility-administered medications on file prior to visit.    No Known Allergies  No family history on file.  Social History   Socioeconomic History  . Marital status: Widowed    Spouse name: Not on file  . Number of children: Not on file  . Years of education: Not on file  . Highest education level: Not on file  Occupational History  . Not on file  Tobacco Use  . Smoking status: Never Smoker  . Smokeless tobacco: Never Used  Vaping Use  . Vaping Use: Never used  Substance and Sexual Activity  . Alcohol use: Yes    Comment: occasionally  . Drug use: Never  . Sexual activity: Not Currently  Other Topics Concern    . Not on file  Social History Narrative  . Not on file   Social Determinants of Health   Financial Resource Strain: Low Risk   . Difficulty of Paying Living Expenses: Not hard at all  Food Insecurity: No Food Insecurity  . Worried About Programme researcher, broadcasting/film/video in the Last Year: Never true  . Ran Out of Food in the Last Year: Never true  Transportation Needs: No Transportation Needs  . Lack of Transportation (Medical): No  . Lack of Transportation (Non-Medical): No  Physical Activity: Inactive  . Days of Exercise per Week: 0 days  . Minutes of Exercise per Session: 0 min  Stress: No Stress Concern Present  . Feeling of Stress : Not at all  Social Connections: Moderately Integrated  . Frequency of Communication with Friends and Family: More than three times a week  . Frequency of Social Gatherings with Friends and Family: More than three times a week  . Attends Religious Services: More than 4 times per year  . Active Member of Clubs or Organizations: Yes  . Attends Banker Meetings: More than 4 times per year  . Marital Status: Widowed   Review of Systems - See HPI.  All other ROS are negative.  There were no vitals taken for this visit.  Physical Exam Vitals reviewed.  Constitutional:      Appearance: Normal appearance.  HENT:     Head: Normocephalic and atraumatic.     Right Ear: Tympanic membrane normal.  Cardiovascular:     Rate and Rhythm: Normal rate and regular rhythm.     Pulses: Normal pulses.     Heart sounds: Normal heart sounds.  Pulmonary:     Effort: Pulmonary effort is normal.     Breath sounds: Normal breath sounds.  Abdominal:     General: Bowel sounds are normal.     Palpations: Abdomen is soft.  Musculoskeletal:     Cervical back: Neck supple.  Neurological:     General: No focal deficit present.     Mental Status: She is alert.  Psychiatric:        Mood and Affect: Mood normal.    Assessment/Plan: 1. Dysuria Classic symptoms. UA  with blood and large LE, hazy. Start empiric treatment for cystitis with Keflex 500 mg BID. Culture sent. Supportive measures and OTC medications reviewed. Will alter regimen according to culture results. Strict return and ER precautions reviewed with patient who voiced understanding and agreement with the plan.  - POCT urinalysis dipstick  This visit occurred during the SARS-CoV-2 public health emergency.  Safety protocols were in place, including screening questions prior to the visit, additional usage of staff PPE, and extensive cleaning of exam room while observing appropriate contact time as indicated for disinfecting solutions.     Piedad Climes, PA-C

## 2019-12-24 ENCOUNTER — Ambulatory Visit: Payer: Medicare Other | Admitting: Physician Assistant

## 2019-12-24 LAB — URINE CULTURE
MICRO NUMBER:: 11221451
SPECIMEN QUALITY:: ADEQUATE

## 2019-12-27 ENCOUNTER — Telehealth: Payer: Self-pay | Admitting: Family Medicine

## 2019-12-27 NOTE — Telephone Encounter (Signed)
Noted and provider informed

## 2019-12-27 NOTE — Telephone Encounter (Signed)
Pt returned phone call and states that she is feeling much better.

## 2019-12-28 ENCOUNTER — Other Ambulatory Visit: Payer: Self-pay | Admitting: Family Medicine

## 2020-01-05 ENCOUNTER — Other Ambulatory Visit: Payer: Self-pay

## 2020-01-05 ENCOUNTER — Ambulatory Visit: Payer: Medicare Other | Admitting: Physician Assistant

## 2020-01-05 ENCOUNTER — Encounter: Payer: Self-pay | Admitting: Physician Assistant

## 2020-01-05 VITALS — BP 128/78 | HR 95 | Temp 98.3°F | Resp 14 | Ht 63.0 in | Wt 155.0 lb

## 2020-01-05 DIAGNOSIS — T7840XA Allergy, unspecified, initial encounter: Secondary | ICD-10-CM

## 2020-01-05 MED ORDER — PREDNISONE 10 MG PO TABS: ORAL_TABLET | ORAL | 0 refills | Status: AC

## 2020-01-05 NOTE — Progress Notes (Signed)
Patient presents to clinic today c/o 5 days of a widespread rash and swelling of her lower lips after eating shrimp and grits at her retirement facility this past Saturday.  Notes some significant improvement regarding lip swelling but still having widespread rash.  Rash is pruritic and occasionally burning in nature.  Denies fever or chills.  Denies tongue swelling or shortness of breath.  Denies any change to soaps, lotions or detergents.  Denies new medication.   Past Medical History:  Diagnosis Date  . Allergy   . GERD (gastroesophageal reflux disease)   . History of chicken pox   . Hyperlipidemia   . Scoliosis   . Thyroid disease     Current Outpatient Medications on File Prior to Visit  Medication Sig Dispense Refill  . aspirin 81 MG EC tablet Take by mouth.    Marland Kitchen atorvastatin (LIPITOR) 40 MG tablet Take 40 mg by mouth daily.    . Cholecalciferol 50 MCG (2000 UT) CAPS TAKE 2 CAPSULES BY MOUTH EVERY DAY    . diclofenac Sodium (VOLTAREN) 1 % GEL Apply 4 g topically 4 (four) times daily as needed. 500 g 6  . levothyroxine (SYNTHROID) 75 MCG tablet Take 75 mcg by mouth daily.    Marland Kitchen loratadine (CLARITIN) 10 MG tablet Take by mouth.    Marland Kitchen omeprazole (PRILOSEC) 40 MG capsule TAKE 1 CAPSULE BY MOUTH EVERY DAY 90 capsule 0  . oxybutynin (DITROPAN-XL) 10 MG 24 hr tablet Take 10 mg by mouth daily.     No current facility-administered medications on file prior to visit.    No Known Allergies  History reviewed. No pertinent family history.  Social History   Socioeconomic History  . Marital status: Widowed    Spouse name: Not on file  . Number of children: Not on file  . Years of education: Not on file  . Highest education level: Not on file  Occupational History  . Not on file  Tobacco Use  . Smoking status: Never Smoker  . Smokeless tobacco: Never Used  Vaping Use  . Vaping Use: Never used  Substance and Sexual Activity  . Alcohol use: Yes    Comment: occasionally  . Drug  use: Never  . Sexual activity: Not Currently  Other Topics Concern  . Not on file  Social History Narrative  . Not on file   Social Determinants of Health   Financial Resource Strain: Low Risk   . Difficulty of Paying Living Expenses: Not hard at all  Food Insecurity: No Food Insecurity  . Worried About Programme researcher, broadcasting/film/video in the Last Year: Never true  . Ran Out of Food in the Last Year: Never true  Transportation Needs: No Transportation Needs  . Lack of Transportation (Medical): No  . Lack of Transportation (Non-Medical): No  Physical Activity: Inactive  . Days of Exercise per Week: 0 days  . Minutes of Exercise per Session: 0 min  Stress: No Stress Concern Present  . Feeling of Stress : Not at all  Social Connections: Moderately Integrated  . Frequency of Communication with Friends and Family: More than three times a week  . Frequency of Social Gatherings with Friends and Family: More than three times a week  . Attends Religious Services: More than 4 times per year  . Active Member of Clubs or Organizations: Yes  . Attends Banker Meetings: More than 4 times per year  . Marital Status: Widowed    Review of Systems -  See HPI.  All other ROS are negative.  Resp 14   Ht 5\' 3"  (1.6 m)   Wt 155 lb (70.3 kg)   BMI 27.46 kg/m   Physical Exam Vitals reviewed.  Constitutional:      Appearance: Normal appearance.  HENT:     Head: Normocephalic and atraumatic.  Cardiovascular:     Rate and Rhythm: Normal rate and regular rhythm.     Pulses: Normal pulses.     Heart sounds: Normal heart sounds.  Pulmonary:     Effort: Pulmonary effort is normal.     Breath sounds: Normal breath sounds.  Musculoskeletal:     Cervical back: Neck supple.  Skin:    Comments: Widespread erythematous macular rash of torso, chest, back and all 4 extremities.  Associated with moderate lower lip swelling.  No periocular swelling noted on examination.  Neurological:     General: No  focal deficit present.     Mental Status: She is alert and oriented to person, place, and time.     Recent Results (from the past 2160 hour(s))  POCT urinalysis dipstick     Status: Abnormal   Collection Time: 12/23/19 10:25 AM  Result Value Ref Range   Color, UA yellow    Clarity, UA hazy    Glucose, UA Negative Negative   Bilirubin, UA Negative    Ketones, UA Negative    Spec Grav, UA 1.015 1.010 - 1.025   Blood, UA 1+    pH, UA 7.0 5.0 - 8.0   Protein, UA Negative Negative   Urobilinogen, UA 0.2 0.2 or 1.0 E.U./dL   Nitrite, UA Negative    Leukocytes, UA Moderate (2+) (A) Negative   Appearance     Odor    Urine Culture     Status: None   Collection Time: 12/23/19 11:00 AM   Specimen: Urine  Result Value Ref Range   MICRO NUMBER: 12/25/19    SPECIMEN QUALITY: Adequate    Sample Source NOT GIVEN    STATUS: FINAL    ISOLATE 1:      Less than 10,000 CFU/mL of single Gram negative organism isolated. No further testing will be performed. If clinically indicated, recollection using a method to minimize contamination, with prompt transfer to Urine Culture Transport Tube, is recommended.    Assessment/Plan: 1. Allergic reaction, initial encounter Suspected food allergy, possibly shellfish per patient had no issue with this for her whole life.  Could have been something else the shrimp was cooked in.  Supportive measures and OTC medications reviewed.  Rx prednisone taper.  Referral to allergist placed.  Strict return and ER precautions discussed with patient who voiced understanding and agreement with the plan. - predniSONE (DELTASONE) 10 MG tablet; Take 4 tablets (40 mg total) by mouth daily with breakfast for 3 days, THEN 3 tablets (30 mg total) daily with breakfast for 3 days, THEN 2 tablets (20 mg total) daily with breakfast for 3 days, THEN 1 tablet (10 mg total) daily with breakfast for 3 days.  Dispense: 30 tablet; Refill: 0 - Ambulatory referral to Allergy  This visit  occurred during the SARS-CoV-2 public health emergency.  Safety protocols were in place, including screening questions prior to the visit, additional usage of staff PPE, and extensive cleaning of exam room while observing appropriate contact time as indicated for disinfecting solutions.     [id number], PA-C

## 2020-01-05 NOTE — Patient Instructions (Signed)
Please keep skin clean and dry.  Apply OTC Sarna lotion -- for the itch. Take the steroid as directed.   Avoid any shellfish until we get further evaluation.  You will be contacted by Allergist for further assessment.  If you do not hear from them within a week, please let me know.   Hang in there!

## 2020-01-12 ENCOUNTER — Other Ambulatory Visit: Payer: Self-pay | Admitting: Physician Assistant

## 2020-01-12 DIAGNOSIS — T7840XA Allergy, unspecified, initial encounter: Secondary | ICD-10-CM

## 2020-01-26 ENCOUNTER — Telehealth: Payer: Self-pay | Admitting: Family Medicine

## 2020-01-26 NOTE — Telephone Encounter (Signed)
Medications last filled by a historical provider. Ok to fill?

## 2020-01-26 NOTE — Telephone Encounter (Signed)
..  Medication Refills  Medication:  Thyroid medication and Oxybutynin   Pharmacy:  [facility] [address], [place]  ** Let patient know to contact pharmacy at the end of the day to make sure medication is ready.**  ** Please notify patient to allow 48-72 hours to process.**  ** Encourage patient to contact the pharmacy for refills or they can request refills through [facility]**  Clinical Fills out below:   Last refill:  QTY:  Refill Date:    Other Comments:   Okay for refill?  Please advise.

## 2020-01-27 MED ORDER — LEVOTHYROXINE SODIUM 75 MCG PO TABS: 75.0000 ug | ORAL_TABLET | Freq: Every day | ORAL | 1 refills | Status: DC

## 2020-01-27 MED ORDER — OXYBUTYNIN CHLORIDE ER 10 MG PO TB24
10.0000 mg | ORAL_TABLET | Freq: Every day | ORAL | 1 refills | Status: DC
Start: 2020-01-27 — End: 2020-07-21

## 2020-01-27 NOTE — Addendum Note (Signed)
Addended by: Sheliah Hatch on: 01/27/2020 11:53 AM   Modules accepted: Orders

## 2020-02-18 ENCOUNTER — Ambulatory Visit: Payer: Medicare Other | Attending: Orthopedic Surgery | Admitting: Physical Therapy

## 2020-02-18 ENCOUNTER — Ambulatory Visit (INDEPENDENT_AMBULATORY_CARE_PROVIDER_SITE_OTHER): Payer: Medicare Other | Admitting: Allergy

## 2020-02-18 ENCOUNTER — Other Ambulatory Visit: Payer: Self-pay

## 2020-02-18 ENCOUNTER — Encounter: Payer: Self-pay | Admitting: Allergy

## 2020-02-18 ENCOUNTER — Encounter: Payer: Self-pay | Admitting: Physical Therapy

## 2020-02-18 VITALS — BP 118/74 | HR 82 | Temp 97.7°F | Resp 18 | Ht 63.0 in | Wt 153.6 lb

## 2020-02-18 DIAGNOSIS — L500 Allergic urticaria: Secondary | ICD-10-CM | POA: Diagnosis not present

## 2020-02-18 DIAGNOSIS — R293 Abnormal posture: Secondary | ICD-10-CM | POA: Diagnosis present

## 2020-02-18 DIAGNOSIS — M546 Pain in thoracic spine: Secondary | ICD-10-CM | POA: Insufficient documentation

## 2020-02-18 DIAGNOSIS — M25511 Pain in right shoulder: Secondary | ICD-10-CM | POA: Insufficient documentation

## 2020-02-18 DIAGNOSIS — L509 Urticaria, unspecified: Secondary | ICD-10-CM

## 2020-02-18 DIAGNOSIS — G8929 Other chronic pain: Secondary | ICD-10-CM | POA: Insufficient documentation

## 2020-02-18 DIAGNOSIS — M6281 Muscle weakness (generalized): Secondary | ICD-10-CM | POA: Insufficient documentation

## 2020-02-18 NOTE — Patient Instructions (Addendum)
RE-ALIGNMENT ROUTINE EXERCISES-OSTEOPROROSIS BASIC FOR POSTURAL CORRECTION   RE-ALIGNMENT Tips BENEFITS: 1.It helps to re-align the curves of the back and improve standing posture. 2.It allows the back muscles to rest and strengthen in preparation for more activity. FREQUENCY: Daily, even after weeks, months and years of more advanced exercises. START: 1.All exercises start in the same position: lying on the back, arms resting on the supporting surface, palms up and slightly away from the body, backs of hands down, knees bent, feet flat. 2.The head, neck, arms, and legs are supported according to specific instructions of your therapist. Copyright  VHI. All rights reserved.    1. Decompression Exercise: Basic.   Takes compression off the vertebral bodies; increases tolerance for lying on the back; helps relieve back pain   Lie on back on firm surface, knees bent, feet flat, arms turned up, out to sides (~35 degrees). Head neck and arms supported as necessary. Time _5-15__ minutes. Surface: floor     2. Shoulder Press  Strengthens upper back extensors and scapular retractors.   Press both shoulders down. Hold _2-3__ seconds. Repeat _3-5__ times. Surface: floor        3. Head Press With Chin Tuck  Strengthens neck extensors   Tuck chin SLIGHTLY toward chest, keep mouth closed. Feel weight on back of head. Increase weight by pressing head down. Hold _2-3__ seconds. Relax. Repeat 3-5___ times. Surface: floor     4. Leg Lengthener: stretches quadratus lumborum and hip flexors.  Strengthens quads and ankle dorsiflexors.  Leg Lengthener: Full    Straighten one leg. Pull toes AND forefoot toward knee, extend heel. Lengthen leg by pulling pelvis away from ribs. Hold __5_ seconds. Relax. Repeat 1 time. Re-bend knee. Do other leg. Each leg __5_ times. Surface: floor    Leg Lengthener / Leg Press Combo: Single Leg    Straighten one leg down to floor. Pull toes AND forefoot  toward knee; extend heel. Lengthen leg by pulling pelvis away from ribs. Press leg down. DO NOT BEND KNEE. Hold _5__ seconds. Relax leg. Repeat exercise 1 time. Relax leg. Re-bend knee. Repeat with other leg. Do 5 times  Access Code: [email or web address] URL: [email or web address] Date: 02/18/2020 Prepared by: Loistine Simas Kadrian Partch  Exercises Supine Shoulder External Rotation with Resistance - 1 x daily - 7 x weekly - 1 sets - 15 reps

## 2020-02-18 NOTE — Patient Instructions (Addendum)
Urticaria (hives)  - at this time etiology of hives and swelling is unknown.  Hives can be caused by a variety of different triggers including illness/infection, foods, medications, stings, exercise, pressure, vibrations, extremes of temperature to name a few however majority of the time there is no identifiable trigger.  Your symptoms have been ongoing for >6 weeks making this chronic thus will obtain labwork to evaluate: CBC w diff, CMP, tryptase, hive panel, environmental panel, alpha-gal panel - skin testing today to food items in shrimp and grit dish and chocolate cake are all negative - if red, itchy hive rash persist then recommend taking a 2 medication antihistamine regimen with Claritin 10mg  daily and Pepcid 20mg  daily until hives resolve - should significant symptoms recur or new symptoms occur, a journal is to be kept recording any foods eaten, beverages consumed, medications taken, activities performed, and environmental conditions within a 6 hour time period prior to the onset of symptoms  Follow-up in 3-4 months or sooner if needed  **We are ordering labs, so please allow 1-2 weeks for the results to come back.  With the newly implemented Cures Act, the labs might be visible to you at the same time that they become visible to me.  However, I will not address the results until all of the results come  back, so please be patient.  In the meantime, continue avoiding your triggering food(s) in your After Visit Summary, including avoidance measures (if applicable), until you hear from me about the results.

## 2020-02-18 NOTE — Progress Notes (Signed)
New Patient Note  RE: EMMILIA Castaneda MRN: [id number] DOB: Mar 17, 1935 Date of Office Visit: 02/18/2020  Referring provider: Waldon Merl, PA-C Primary care provider: Sheliah Hatch, MD  Chief Complaint: reaction, rash  History of present illness: Sabrina Castaneda is a 85 y.o. female presenting today for consultation for allergic reaction.    She lives in [place] [facility] [place] and she ate at [employer] there a meal of shrimp and grits and a dense chocolate cake dessert.  The shrimp was in a sauce that was spooned over the shrimp.  She states after the meal she went home she noted a rash on her face shoulders, legs that was red, splotchy and itchy.  This occurred about a month ago.  She states she has eaten shrimp all her life without issue.  She did see PCP regarding this and was   Prescribed prednisone course that she states was like an "out of body experience" and doesn't want to take that again.    She has been using sarna now for moisturization.  She states she had similar return of rash couple of times since the shrimp and grits meal and has noted it on her neck, shoulder and lower back.  She has not had any shrimp since.  She states she unscented detergents. She doesn't wear perfume. She has had no changes in her environment or activities she performs.  No swelling, GI, respiratory or CV related symptoms.   She does take claritin as needed usually in spring and fall.   Review of systems: Review of Systems  Constitutional: Negative.   HENT: Negative.   Eyes: Negative.   Respiratory: Negative.   Cardiovascular: Negative.   Musculoskeletal: Negative.   Skin: Positive for itching and rash.  Neurological: Negative.     All other systems negative unless noted above in HPI  Past medical history: Past Medical History:  Diagnosis Date  . Allergy   . GERD (gastroesophageal reflux disease)   . History of chicken pox   . Hyperlipidemia   . Scoliosis   . Thyroid  disease     Past surgical history: Past Surgical History:  Procedure Laterality Date  . ABDOMINAL HYSTERECTOMY  1980  . ADENOIDECTOMY    . HERNIA REPAIR  02/04/1966  . REPLACEMENT TOTAL KNEE Right   . TONSILLECTOMY    . WRIST FRACTURE SURGERY Bilateral     Family history:  Family History  Problem Relation Age of Onset  . Allergic rhinitis Neg Hx   . Angioedema Neg Hx   . Asthma Neg Hx   . Atopy Neg Hx   . Eczema Neg Hx   . Immunodeficiency Neg Hx   . Urticaria Neg Hx     Social history: Lives in an apartment in a retirement community without carpeting in apartment.  Dog in the home.  No concern for water damage, mildew or roaches in the home. Denies a smoking history.   Medication List: Current Outpatient Medications  Medication Sig Dispense Refill  . aspirin 81 MG EC tablet Take by mouth.    Marland Kitchen atorvastatin (LIPITOR) 40 MG tablet Take 40 mg by mouth daily.    . Cholecalciferol 50 MCG (2000 UT) CAPS TAKE 2 CAPSULES BY MOUTH EVERY DAY    . levothyroxine (SYNTHROID) 75 MCG tablet Take 1 tablet (75 mcg total) by mouth daily. 90 tablet 1  . loratadine (CLARITIN) 10 MG tablet Take by mouth.    Marland Kitchen omeprazole (PRILOSEC) 40 MG capsule  TAKE 1 CAPSULE BY MOUTH EVERY DAY 90 capsule 0  . oxybutynin (DITROPAN-XL) 10 MG 24 hr tablet Take 1 tablet (10 mg total) by mouth daily. 90 tablet 1  . diclofenac Sodium (VOLTAREN) 1 % GEL Apply 4 g topically 4 (four) times daily as needed. 500 g 6   No current facility-administered medications for this visit.    Known medication allergies: No Known Allergies   Physical examination: Blood pressure 118/74, pulse 82, temperature 97.7 F (36.5 C), resp. rate 18, height 5\' 3"  (1.6 m), weight 153 lb 9.6 oz (69.7 kg), SpO2 94 %.  General: Alert, interactive, in no acute distress. HEENT: PERRLA, TMs pearly gray, turbinates non-edematous without discharge, post-pharynx non erythematous. Neck: Supple without lymphadenopathy. Lungs: Clear to  auscultation without wheezing, rhonchi or rales. {no increased work of breathing. CV: Normal S1, S2 without murmurs. Abdomen: Nondistended, nontender. Skin: Warm and dry, without lesions or rashes. Extremities:  No clubbing, cyanosis or edema. Neuro:   Grossly intact.  Diagnositics/Labs:  Allergy testing: select food allergy skin prick testing is negative to milk, shrimp, tomato, corn, chocolate, garlic, black pepper.  Histamine control is positive.   Allergy testing results were read and interpreted by provider, documented by clinical staff.   Assessment and plan:   Urticaria (hives)  - at this time etiology of hives and swelling is unknown.  Hives can be caused by a variety of different triggers including illness/infection, foods, medications, stings, exercise, pressure, vibrations, extremes of temperature to name a few however majority of the time there is no identifiable trigger.  Your symptoms have been ongoing for >6 weeks making this chronic thus will obtain labwork to evaluate: CBC w diff, CMP, tryptase, hive panel, environmental panel, alpha-gal panel - skin testing today to food items in shrimp and grit dish and chocolate cake are all negative - if red, itchy hive rash persist then recommend taking a 2 medication antihistamine regimen with Claritin 10mg  daily and Pepcid 20mg  daily until hives resolve - should significant symptoms recur or new symptoms occur, a journal is to be kept recording any foods eaten, beverages consumed, medications taken, activities performed, and environmental conditions within a 6 hour time period prior to the onset of symptoms  Follow-up in 3-4 months or sooner if needed  I appreciate the opportunity to take part in Natajah's care. Please do not hesitate to contact me with questions.  Sincerely,   , MD Allergy/Immunology [facility]

## 2020-02-18 NOTE — Therapy (Signed)
[facility] [address] [place], [place], [address] Phone: [phone]   Fax:  [phone]  Physical Therapy Evaluation  Patient Details  Name: Sabrina Castaneda MRN: [id number] Date of Birth: 01-09-1936 Referring Provider (PT): Francena Hanly, MD   Encounter Date: 02/18/2020   PT End of Session - 02/18/20 0824    Visit Number 1    Date for PT Re-Evaluation 05/12/20    Authorization Type UHC Medicare    Progress Note Due on Visit 10    PT Start Time 0830    PT Stop Time 0920    PT Time Calculation (min) 50 min    Activity Tolerance Patient tolerated treatment well    Behavior During Therapy [facility] for tasks assessed/performed           Past Medical History:  Diagnosis Date  . Allergy   . GERD (gastroesophageal reflux disease)   . History of chicken pox   . Hyperlipidemia   . Scoliosis   . Thyroid disease     Past Surgical History:  Procedure Laterality Date  . ABDOMINAL HYSTERECTOMY  1980  . HERNIA REPAIR  02/04/1966  . REPLACEMENT TOTAL KNEE Right   . WRIST FRACTURE SURGERY Bilateral     There were no vitals filed for this visit.    Subjective Assessment - 02/18/20 0827    Subjective Pt is referred to OPPT with ongoing Rt shoulder pain and loss of range of motion.  She also has scoliosis with Rt thoracic convexity.  Pt has had 2 injecitons into Rt shoudler which has helped.  Pt reports soreness vs intense pain which is helping her to sleep better.  Pt is using Rt dominant arm with some accommodations with dressing.    Pertinent History 2021 Rt shoulder MRI + findings (see below), scoliosis with Rt convexity, Rt TKR, bil wrist fracture surgery from 2 separate falls, bone density test has been ordered, no recent Hx of falls    Limitations House hold activities;Lifting;Other (comment)   sleep   Diagnostic tests Rt shoulder MRI: RC and bicep tendinopathy, small full-thickness tear of anterior supraspinatus, AC  joint arthritis, degenerative labrum    Patient Stated Goals build up shoulder strength    Currently in Pain? Yes    Pain Score 4     Pain Location Shoulder    Pain Orientation Right;Posterior    Pain Descriptors / Indicators Aching;Dull    Pain Type Chronic pain    Pain Radiating Towards upper arm    Pain Onset More than a month ago    Pain Frequency Constant    Aggravating Factors  laying on Rt side, reaching out to side    Pain Relieving Factors using Rt arm with caution, ice as needed    Effect of Pain on Daily Activities sleep, modify use of Rt arm for ADLs              [facility] PT Assessment - 02/18/20 0001      Assessment   Medical Diagnosis M25.511 (ICD-10-CM) - Pain in right shoulder    Referring Provider (PT) Francena Hanly, MD    Onset Date/Surgical Date --   worsened about 1 year ago   Hand Dominance Right    Prior Therapy yes at this facility for thoracic pain      Precautions   Precautions Other (comment)    Precaution Comments osteopenia, falls in past, no recent history of falls      Restrictions  Weight Bearing Restrictions No      Balance Screen   Has the patient fallen in the past 6 months No    Has the patient had a decrease in activity level because of a fear of falling?  No    Is the patient reluctant to leave their home because of a fear of falling?  No      Home Environment   Living Environment Assisted living    Home Equipment None      Prior Function   Level of Independence Independent    Leisure cards, games, has a small dog      Cognition   Overall Cognitive Status Within Functional Limits for tasks assessed      Observation/Other Assessments   Focus on Therapeutic Outcomes (FOTO)  62% goal 61%      Posture/Postural Control   Posture/Postural Control Postural limitations    Postural Limitations Rounded Shoulders;Forward head    Posture Comments scoliosis with thoracic convexity on Rt, scpaular winging Rt      ROM / Strength   AROM  / PROM / Strength AROM;PROM;Strength      AROM   Overall AROM Comments cervical ROM grossly limited 30% all planes, Lt shoulder WNL, Rt rotations [facility]    AROM Assessment Site Shoulder    Right/Left Shoulder Right;Left    Right Shoulder Extension 50 Degrees    Right Shoulder Flexion 120 Degrees   Lt LBP   Right Shoulder ABduction 145 Degrees    Right Shoulder Internal Rotation --    Right Shoulder External Rotation --   to T/L jxn     PROM   PROM Assessment Site Shoulder    Right/Left Shoulder Right    Right Shoulder Flexion 165 Degrees      Strength   Strength Assessment Site Shoulder    Right/Left Shoulder Right;Left    Right Shoulder Flexion 3+/5    Right Shoulder Extension 3+/5    Right Shoulder ABduction 3+/5    Right Shoulder Internal Rotation 3+/5    Right Shoulder External Rotation 3+/5    Right Shoulder Horizontal ABduction 3+/5    Right Shoulder Horizontal ADduction 3+/5    Left Shoulder Flexion 4-/5    Left Shoulder Extension 4-/5    Left Shoulder ABduction 4-/5    Left Shoulder Internal Rotation 4-/5    Left Shoulder External Rotation 4-/5    Left Shoulder Horizontal ABduction 4/5    Left Shoulder Horizontal ADduction 4-/5      Palpation   Palpation comment Rt supraspinatus and infraspinatus tendons, bicep tendon                      Objective measurements completed on examination: See above findings.               PT Education - 02/18/20 0913    Education Details Access Code: [email or web address], spinal decompression series    Person(s) Educated Patient    Methods Explanation;Demonstration;Handout    Comprehension Verbalized understanding;Returned demonstration            PT Short Term Goals - 02/18/20 0933      PT SHORT TERM GOAL #1   Title Pt will be ind with initial HEP    Time 4    Period Weeks    Status New    Target Date 03/17/20      PT SHORT TERM GOAL #2   Title pt will be educated on benefits of spinal decompression  and  osteoporosis dos and donts    Time 4    Period Weeks    Status New    Target Date 03/17/20      PT SHORT TERM GOAL #3   Title Pt will report reduced Lt thoracic pain with Rt UE ADLs by at least 20%    Time 4    Period Weeks    Status New    Target Date 03/17/20             PT Long Term Goals - 02/18/20 0934      PT LONG TERM GOAL #1   Title Pt will be ind with advanced HEP    Time 12    Period Weeks    Status New    Target Date 05/12/20      PT LONG TERM GOAL #2   Title Pt will improve scapular and GH joint strength for functional reaching tasks with no or min strain on spine.    Time 12    Period Weeks    Status New    Target Date 05/12/20      PT LONG TERM GOAL #3   Title Pt will report reduced thoracic and Rt shoulder pain with daily activities and sleep by at least 60%    Time 12    Period Weeks    Status New    Target Date 05/12/20      PT LONG TERM GOAL #4   Title Pt will be able to verbalize and demonstate postural corrections and change of postion with daily activity to reduce pain and improve alignment    Time 12    Period Weeks    Status New    Target Date 05/12/20      PT LONG TERM GOAL #5   Title -    Time --    Period Weeks    Status New    Target Date --                  Plan - 02/18/20 9528    Clinical Impression Statement Pt is a previous patient referred to OPPT for Rt shoulder pain.  She lives at [facility].  She is right-handed and has pain ranging from 3-4/10.  Pain is a constant ache and radiates into upper arm.  She is limited by Lt-sided thoracic pain with Rt UE use more so than by Rt shoudler pain.  Scoliosis is present with Rt thoracic convexity.  Rt scapular winging is present.  Pt has + Rt shoulder MRI for biceps and RC tendinosis, AC joint arthritis with bone spur, degen labrum and small full thickness tear of supraspinatus.  Pt has limited Rt shoulder A/ROM for flexion (120 deg) and abduction (165 deg) and weakness Rt  (3+/5)>Lt (4-/5) for bil shoulders.  She has ongoing Lt mid-back pain during Rt shoulder strength testing and ROM assessment.  She has tenderness along bicep tendon and RC tendons.  Given back pain linked to shoulder, history of osteopenia, and scoliosis, PT initiated spinal decompression series today.  Pt was also able to tol supine yelllow band ER so this was added as well.   Pt will benefit from skilled PT to address pain, deficits, and improve functional use of Rt UE with less pain/impact on spine.    Personal Factors and Comorbidities Age;Comorbidity 1    Comorbidities scoliosis    Examination-Activity Limitations Lift;Reach Overhead;Sleep;Dressing;Bed Mobility    Examination-Participation Restrictions Community Activity;Shop    Stability/Clinical Decision  Making Stable/Uncomplicated    Clinical Decision Making Low    Rehab Potential Excellent    PT Frequency 2x / week    PT Duration 12 weeks    PT Treatment/Interventions ADLs/Self Care Home Management;Cryotherapy;Moist Heat;Iontophoresis 4mg /ml Dexamethasone;Electrical Stimulation;Functional mobility training;Therapeutic activities;Therapeutic exercise;Neuromuscular re-education;Patient/family education;Manual techniques;Passive range of motion;Joint Manipulations;Spinal Manipulations    PT Next Visit Plan f/u on spinal decompression and HEP, try yellow supine horiz abd, seated yellow band row and ext, manual to Rt GH/AC joints, STM RC and bicep tendons/Rt upper quadrant    PT Home Exercise Plan Access Code: [email or web address] + spinal decompression series    Consulted and Agree with Plan of Care Patient           Patient will benefit from skilled therapeutic intervention in order to improve the following deficits and impairments:  Decreased range of motion,Impaired UE functional use,Postural dysfunction,Decreased strength,Improper body mechanics,Pain,Hypomobility,Decreased mobility,Increased muscle spasms  Visit Diagnosis: Chronic right shoulder  pain - Plan: PT plan of care cert/re-cert  Pain in thoracic spine - Plan: PT plan of care cert/re-cert  Abnormal posture - Plan: PT plan of care cert/re-cert  Muscle weakness (generalized) - Plan: PT plan of care cert/re-cert     Problem List Patient Active Problem List   Diagnosis Date Noted  . Physical exam 09/10/2019  . Hyperlipidemia 08/16/2019  . Hypothyroid 08/16/2019  . Diplopia 03/09/2012  . Female stress incontinence 12/05/2010    Morton PetersJohanna Keoni Havey, PT 02/18/20 11:58 AM   [facility] [address] [place], [place], [address] Phone: [phone]   Fax:  [phone]  Name: Sabrina Castaneda MRN: [id number] Date of Birth: Jun 19, 1935

## 2020-02-21 ENCOUNTER — Ambulatory Visit: Payer: Medicare Other | Admitting: Allergy

## 2020-02-21 ENCOUNTER — Ambulatory Visit: Payer: Medicare Other | Admitting: Physical Therapy

## 2020-02-25 ENCOUNTER — Ambulatory Visit: Payer: Medicare Other | Admitting: Physical Therapy

## 2020-03-01 ENCOUNTER — Other Ambulatory Visit: Payer: Self-pay

## 2020-03-01 ENCOUNTER — Encounter: Payer: Self-pay | Admitting: Physical Therapy

## 2020-03-01 ENCOUNTER — Ambulatory Visit: Payer: Medicare Other | Admitting: Physical Therapy

## 2020-03-01 DIAGNOSIS — M25511 Pain in right shoulder: Secondary | ICD-10-CM | POA: Diagnosis not present

## 2020-03-01 DIAGNOSIS — M546 Pain in thoracic spine: Secondary | ICD-10-CM

## 2020-03-01 DIAGNOSIS — G8929 Other chronic pain: Secondary | ICD-10-CM

## 2020-03-01 DIAGNOSIS — M7541 Impingement syndrome of right shoulder: Secondary | ICD-10-CM | POA: Insufficient documentation

## 2020-03-01 DIAGNOSIS — M6281 Muscle weakness (generalized): Secondary | ICD-10-CM

## 2020-03-01 DIAGNOSIS — R293 Abnormal posture: Secondary | ICD-10-CM

## 2020-03-01 LAB — COMPREHENSIVE METABOLIC PANEL
ALT: 17 IU/L (ref 0–32)
AST: 17 IU/L (ref 0–40)
Albumin/Globulin Ratio: 2.3 — ABNORMAL HIGH (ref 1.2–2.2)
Albumin: 4.5 g/dL (ref 3.6–4.6)
Alkaline Phosphatase: 78 IU/L (ref 44–121)
BUN/Creatinine Ratio: 19 (ref 12–28)
BUN: 14 mg/dL (ref 8–27)
Bilirubin Total: 0.5 mg/dL (ref 0.0–1.2)
CO2: 25 mmol/L (ref 20–29)
Calcium: 9.9 mg/dL (ref 8.7–10.3)
Chloride: 102 mmol/L (ref 96–106)
Creatinine, Ser: 0.73 mg/dL (ref 0.57–1.00)
GFR calc Af Amer: 89 mL/min/{1.73_m2} (ref >59)
GFR calc non Af Amer: 77 mL/min/{1.73_m2} (ref >59)
Globulin, Total: 2 g/dL (ref 1.5–4.5)
Glucose: 95 mg/dL (ref 65–99)
Potassium: 4.6 mmol/L (ref 3.5–5.2)
Sodium: 139 mmol/L (ref 134–144)
Total Protein: 6.5 g/dL (ref 6.0–8.5)

## 2020-03-01 LAB — CBC WITH DIFFERENTIAL
Basophils Absolute: 0.1 10*3/uL (ref 0.0–0.2)
Basos: 1 %
EOS (ABSOLUTE): 0.2 10*3/uL (ref 0.0–0.4)
Eos: 3 %
Hematocrit: 41.6 % (ref 34.0–46.6)
Hemoglobin: 14 g/dL (ref 11.1–15.9)
Immature Grans (Abs): 0 10*3/uL (ref 0.0–0.1)
Immature Granulocytes: 0 %
Lymphocytes Absolute: 1.7 10*3/uL (ref 0.7–3.1)
Lymphs: 29 %
MCH: 29.2 pg (ref 26.6–33.0)
MCHC: 33.7 g/dL (ref 31.5–35.7)
MCV: 87 fL (ref 79–97)
Monocytes Absolute: 0.6 10*3/uL (ref 0.1–0.9)
Monocytes: 11 %
Neutrophils Absolute: 3.2 10*3/uL (ref 1.4–7.0)
Neutrophils: 56 %
RBC: 4.79 x10E6/uL (ref 3.77–5.28)
RDW: 13.1 % (ref 11.7–15.4)
WBC: 5.7 10*3/uL (ref 3.4–10.8)

## 2020-03-01 LAB — ALLERGENS W/TOTAL IGE AREA 2

## 2020-03-01 LAB — CHRONIC URTICARIA: cu index: 16.5 — ABNORMAL HIGH (ref <10)

## 2020-03-01 LAB — ALPHA-GAL PANEL
Allergen Lamb IgE: <0.1 kU/L
Beef IgE: <0.1 kU/L
IgE (Immunoglobulin E), Serum: 4 IU/mL — ABNORMAL LOW (ref 6–495)
O215-IgE Alpha-Gal: <0.1 kU/L
Pork IgE: <0.1 kU/L

## 2020-03-01 LAB — TRYPTASE: Tryptase: 3.7 ug/L (ref 2.2–13.2)

## 2020-03-01 NOTE — Therapy (Signed)
[facility] [address], [address] [place], [place], [address] Phone: [phone]   Fax:  [phone]  Physical Therapy Treatment  Patient Details  Name: Sabrina Castaneda MRN: [id number] Date of Birth: 1935/10/20 Referring Provider (PT): Francena Hanly, MD   Encounter Date: 03/01/2020   PT End of Session - 03/01/20 1056    Visit Number 2    Date for PT Re-Evaluation 05/12/20    Authorization Type UHC Medicare    Progress Note Due on Visit 10    PT Start Time 1100    PT Stop Time 1145    PT Time Calculation (min) 45 min    Activity Tolerance Patient tolerated treatment well    Behavior During Therapy [facility] for tasks assessed/performed           Past Medical History:  Diagnosis Date  . Allergy   . GERD (gastroesophageal reflux disease)   . History of chicken pox   . Hyperlipidemia   . Scoliosis   . Thyroid disease     Past Surgical History:  Procedure Laterality Date  . ABDOMINAL HYSTERECTOMY  1980  . ADENOIDECTOMY    . HERNIA REPAIR  02/04/1966  . REPLACEMENT TOTAL KNEE Right   . TONSILLECTOMY    . WRIST FRACTURE SURGERY Bilateral     There were no vitals filed for this visit.   Subjective Assessment - 03/01/20 1101    Subjective I am doing the decompression and ER with the band and they feel good.  My shoulder continues to do better since the injections.  I get occassional pain in Lt mid back when using Rt arm.    Pertinent History 2021 Rt shoulder MRI + findings (see below), scoliosis with Rt convexity, Rt TKR, bil wrist fracture surgery from 2 separate falls, bone density test has been ordered, no recent Hx of falls    Diagnostic tests Rt shoulder MRI: RC and bicep tendinopathy, small full-thickness tear of anterior supraspinatus, AC joint arthritis, degenerative labrum    Patient Stated Goals build up shoulder strength    Currently in Pain? Yes    Pain Score 5     Pain Location Shoulder    Pain Orientation  Right;Posterior    Pain Descriptors / Indicators Aching;Dull    Pain Type Chronic pain    Pain Radiating Towards upper arm    Pain Onset More than a month ago    Pain Frequency Constant    Aggravating Factors  laying on Rt side, reaching out to side    Effect of Pain on Daily Activities sleep, ADLs like dressing                             [facility] Adult PT Treatment/Exercise - 03/01/20 0001      Exercises   Exercises Shoulder;Neck      Shoulder Exercises: Supine   Protraction Strengthening;Right;15 reps;Weights    Protraction Weight (lbs) 1    Protraction Limitations PT guided form      Shoulder Exercises: Seated   Elevation Strengthening;Right;5 reps;Weights    Elevation Weight (lbs) 1    Elevation Limitations and scaption 1lb x 5, pain with this      Shoulder Exercises: Standing   Extension Strengthening;Both;20 reps;Theraband    Theraband Level (Shoulder Extension) Level 1 (Yellow);Level 2 (Red)    Extension Limitations 1x10 each color    Row Strengthening;Both;Theraband;20 reps    Theraband Level (Shoulder Row)  Level 2 (Red)      Shoulder Exercises: ROM/Strengthening   Nustep L2 x 6' PT present to discuss symptoms and HEP    Proximal Shoulder Strengthening, Supine circles Rt UE at 90 deg 10x each way, 1lb      Manual Therapy   Manual Therapy Soft tissue mobilization    Manual therapy comments bil thoracic paraspinals, Lt lumbar paraspinals, intercostals bil, in prone                  PT Education - 03/01/20 1126    Education Details Access Code: [email or web address]    Person(s) Educated Patient    Methods Explanation;Demonstration;Tactile cues;Handout    Comprehension Verbalized understanding            PT Short Term Goals - 03/01/20 1258      PT SHORT TERM GOAL #1   Title Pt will be ind with initial HEP    Status On-going      PT SHORT TERM GOAL #2   Title pt will be educated on benefits of spinal decompression and osteoporosis dos and  donts    Status Achieved      PT SHORT TERM GOAL #3   Title Pt will report reduced Lt thoracic pain with Rt UE ADLs by at least 20%    Status New             PT Long Term Goals - 02/18/20 0934      PT LONG TERM GOAL #1   Title Pt will be ind with advanced HEP    Time 12    Period Weeks    Status New    Target Date 05/12/20      PT LONG TERM GOAL #2   Title Pt will improve scapular and GH joint strength for functional reaching tasks with no or min strain on spine.    Time 12    Period Weeks    Status New    Target Date 05/12/20      PT LONG TERM GOAL #3   Title Pt will report reduced thoracic and Rt shoulder pain with daily activities and sleep by at least 60%    Time 12    Period Weeks    Status New    Target Date 05/12/20      PT LONG TERM GOAL #4   Title Pt will be able to verbalize and demonstate postural corrections and change of postion with daily activity to reduce pain and improve alignment    Time 12    Period Weeks    Status New    Target Date 05/12/20      PT LONG TERM GOAL #5   Title -    Time --    Period Weeks    Status New    Target Date --                 Plan - 03/01/20 1254    Clinical Impression Statement Pt continues to have a dull constant ache in Rt shoulder, rated 5/10.  She was able to perform some supine and standing ther ex for Rt shoulder today but does experience increased pain described as burning in Lt back with ther ex.  She has signif scoliosis which likely plays a role in these symptoms.  PT progressed HEP with low reps and encouraged row and extension to be done in sitting vs standing to see if this helps her back pain with this.  PT performed STM  to bil lumbar and thoracic paraspinals given connection of back pain to her shoulder dysfunction.  Continue along POC with ongoing monitoring and assessment of response to treatment.    Comorbidities scoliosis    PT Frequency 2x / week    PT Duration 12 weeks    PT  Treatment/Interventions ADLs/Self Care Home Management;Cryotherapy;Moist Heat;Iontophoresis 4mg /ml Dexamethasone;Electrical Stimulation;Functional mobility training;Therapeutic activities;Therapeutic exercise;Neuromuscular re-education;Patient/family education;Manual techniques;Passive range of motion;Joint Manipulations;Spinal Manipulations    PT Next Visit Plan f/u on HEP (did Pt need yellow and red band?), manual to Rt shoulder tendons and A/C joint, STM thoracic and lumbar paraspinals as needed    PT Home Exercise Plan Access Code: [email or web address] + spinal decompression series    Consulted and Agree with Plan of Care Patient           Patient will benefit from skilled therapeutic intervention in order to improve the following deficits and impairments:     Visit Diagnosis: Chronic right shoulder pain  Pain in thoracic spine  Abnormal posture  Muscle weakness (generalized)     Problem List Patient Active Problem List   Diagnosis Date Noted  . Physical exam 09/10/2019  . Hyperlipidemia 08/16/2019  . Hypothyroid 08/16/2019  . Diplopia 03/09/2012  . Female stress incontinence 12/05/2010    12/07/2010, PT 03/01/20 12:59 PM   [facility] [address] [place], [place], [place] Phone: [phone]   Fax:  [phone]  Name: Sabrina Castaneda MRN: [name] Date of Birth: Jul 11, 1935

## 2020-03-01 NOTE — Patient Instructions (Signed)
Access Code: [email or web address] URL: [email or web address] Date: 03/01/2020 Prepared by: Loistine Simas Jehiel Koepp  Exercises Supine Shoulder External Rotation with Resistance - 2 x daily - 7 x weekly - 1 sets - 15 reps Seated Shoulder Row with Anchored Resistance - 2 x daily - 7 x weekly - 1 sets - 10 reps Seated Shoulder Extension and Scapular Retraction with Resistance - 2 x daily - 7 x weekly - 1 sets - 10 reps Supine Single Arm Shoulder Protraction - 2 x daily - 7 x weekly - 1 sets - 15 reps Supine Shoulder Circles - 2 x daily - 7 x weekly - 1 sets - 10 reps

## 2020-03-07 ENCOUNTER — Encounter: Payer: Self-pay | Admitting: Physical Therapy

## 2020-03-07 ENCOUNTER — Other Ambulatory Visit: Payer: Self-pay

## 2020-03-07 ENCOUNTER — Ambulatory Visit: Payer: Medicare Other | Attending: Orthopedic Surgery | Admitting: Physical Therapy

## 2020-03-07 DIAGNOSIS — M25511 Pain in right shoulder: Secondary | ICD-10-CM | POA: Diagnosis present

## 2020-03-07 DIAGNOSIS — G8929 Other chronic pain: Secondary | ICD-10-CM | POA: Insufficient documentation

## 2020-03-07 DIAGNOSIS — R252 Cramp and spasm: Secondary | ICD-10-CM | POA: Insufficient documentation

## 2020-03-07 DIAGNOSIS — R293 Abnormal posture: Secondary | ICD-10-CM | POA: Diagnosis present

## 2020-03-07 DIAGNOSIS — M546 Pain in thoracic spine: Secondary | ICD-10-CM | POA: Insufficient documentation

## 2020-03-07 DIAGNOSIS — M6281 Muscle weakness (generalized): Secondary | ICD-10-CM | POA: Insufficient documentation

## 2020-03-07 NOTE — Therapy (Signed)
[facility] [address] [place], [place], [address] Phone: [phone]   Fax:  [phone]  Physical Therapy Treatment  Patient Details  Name: Sabrina Castaneda MRN: [id number] Date of Birth: 1935-08-09 Referring Provider (PT): Francena Hanly, MD   Encounter Date: 03/07/2020   PT End of Session - 03/07/20 1529    Visit Number 3    Date for PT Re-Evaluation 05/12/20    Authorization Type [employer] Medicare    Progress Note Due on Visit 10    PT Start Time 1447    PT Stop Time 1529    PT Time Calculation (min) 42 min    Activity Tolerance Patient tolerated treatment well    Behavior During Therapy [facility] for tasks assessed/performed           Past Medical History:  Diagnosis Date  . Allergy   . GERD (gastroesophageal reflux disease)   . History of chicken pox   . Hyperlipidemia   . Scoliosis   . Thyroid disease     Past Surgical History:  Procedure Laterality Date  . ABDOMINAL HYSTERECTOMY  1980  . ADENOIDECTOMY    . HERNIA REPAIR  02/04/1966  . REPLACEMENT TOTAL KNEE Right   . TONSILLECTOMY    . WRIST FRACTURE SURGERY Bilateral     There were no vitals filed for this visit.   Subjective Assessment - 03/07/20 1447    Subjective My Rt shoulder is sore but the massage helped my back not hurt as much when I use my Rt arm.    Pertinent History 2021 Rt shoulder MRI + findings (see below), scoliosis with Rt convexity, Rt TKR, bil wrist fracture surgery from 2 separate falls, bone density test has been ordered, no recent Hx of falls    Limitations House hold activities;Lifting;Other (comment)    Diagnostic tests Rt shoulder MRI: RC and bicep tendinopathy, small full-thickness tear of anterior supraspinatus, AC joint arthritis, degenerative labrum    Patient Stated Goals build up shoulder strength    Currently in Pain? Yes    Pain Score 5     Pain Location Shoulder    Pain Descriptors / Indicators Sore    Pain Type  Chronic pain    Pain Onset More than a month ago    Pain Frequency Constant    Aggravating Factors  lay on Rt side, reach out to side    Pain Relieving Factors using Rt arm with caution    Effect of Pain on Daily Activities sleep, ADLs                             [facility] Adult PT Treatment/Exercise - 03/07/20 0001      Shoulder Exercises: Supine   Protraction Strengthening;AROM;15 reps    Flexion AROM;10 reps    Flexion Limitations to 90 deg    Other Supine Exercises supine circles at 90 deg x 10 each way x 2 rounds at 90 deg    Other Supine Exercises spinal decompression series for pain relief end of session: reviewed as indiv series for chin nod, scap squeeze, leg lengthener, leg press 5x5 sec each      Shoulder Exercises: Seated   Extension Strengthening;Both;10 reps;Theraband    Theraband Level (Shoulder Extension) Level 1 (Yellow)    Row Strengthening;Theraband;10 reps    Theraband Level (Shoulder Row) Level 1 (Yellow)    Flexion AAROM;10 reps    Flexion Limitations Rt  wall slides      Shoulder Exercises: ROM/Strengthening   UBE (Upper Arm Bike) L1 x 1 min each way PT present to monitor, hard for Pt      Manual Therapy   Manual Therapy Soft tissue mobilization;Joint mobilization    Manual therapy comments Rt shoulder post delt, infraspinatus, lat, teres minor    Joint Mobilization gentle GH joint on Rt distraction and posterior and inferior glides                    PT Short Term Goals - 03/01/20 1258      PT SHORT TERM GOAL #1   Title Pt will be ind with initial HEP    Status On-going      PT SHORT TERM GOAL #2   Title pt will be educated on benefits of spinal decompression and osteoporosis dos and donts    Status Achieved      PT SHORT TERM GOAL #3   Title Pt will report reduced Lt thoracic pain with Rt UE ADLs by at least 20%    Status New             PT Long Term Goals - 02/18/20 0934      PT LONG TERM GOAL #1   Title Pt  will be ind with advanced HEP    Time 12    Period Weeks    Status New    Target Date 05/12/20      PT LONG TERM GOAL #2   Title Pt will improve scapular and GH joint strength for functional reaching tasks with no or min strain on spine.    Time 12    Period Weeks    Status New    Target Date 05/12/20      PT LONG TERM GOAL #3   Title Pt will report reduced thoracic and Rt shoulder pain with daily activities and sleep by at least 60%    Time 12    Period Weeks    Status New    Target Date 05/12/20      PT LONG TERM GOAL #4   Title Pt will be able to verbalize and demonstate postural corrections and change of postion with daily activity to reduce pain and improve alignment    Time 12    Period Weeks    Status New    Target Date 05/12/20      PT LONG TERM GOAL #5   Title -    Time --    Period Weeks    Status New    Target Date --                 Plan - 03/07/20 1530    Clinical Impression Statement Pt reported improved back pain with use of Rt UE after last session.  She did experience return of burning in mid-back during session today but spinal decompression series give almost immediate relief.  PT performed STM to posterior shoulder and some gentle joint mobs today with good relief.  Pt is able to perform wall slides with some soreness in Rt shoulder but has ROM WFL.  She needed to sit for tband UE exercises secondary to back pain today.  Pt to travel next week and will return after trip.    Comorbidities scoliosis    Rehab Potential Excellent    PT Frequency 2x / week    PT Duration 12 weeks    PT Treatment/Interventions ADLs/Self Care Home  Management;Cryotherapy;Moist Heat;Iontophoresis 4mg /ml Dexamethasone;Electrical Stimulation;Functional mobility training;Therapeutic activities;Therapeutic exercise;Neuromuscular re-education;Patient/family education;Manual techniques;Passive range of motion;Joint Manipulations;Spinal Manipulations    PT Next Visit Plan monitor  Lt back pain and shoulder soreness, ROM, strength, spinal decompression as needed    PT Home Exercise Plan Access Code: [email or web address] + spinal decompression series    Consulted and Agree with Plan of Care Patient           Patient will benefit from skilled therapeutic intervention in order to improve the following deficits and impairments:     Visit Diagnosis: Chronic right shoulder pain  Pain in thoracic spine  Abnormal posture  Muscle weakness (generalized)     Problem List Patient Active Problem List   Diagnosis Date Noted  . Physical exam 09/10/2019  . Hyperlipidemia 08/16/2019  . Hypothyroid 08/16/2019  . Diplopia 03/09/2012  . Female stress incontinence 12/05/2010    12/07/2010 Kairon Shock 03/07/2020, 3:33 PM  [facility] [address], [address] [place], [place], [place] Phone: [phone]   Fax:  [phone]  Name: Sabrina Castaneda MRN: [name] Date of Birth: [birth year]

## 2020-03-10 ENCOUNTER — Ambulatory Visit: Payer: Medicare Other | Admitting: Physical Therapy

## 2020-03-13 ENCOUNTER — Encounter: Payer: Self-pay | Admitting: Family Medicine

## 2020-03-13 ENCOUNTER — Other Ambulatory Visit: Payer: Self-pay

## 2020-03-13 ENCOUNTER — Ambulatory Visit (INDEPENDENT_AMBULATORY_CARE_PROVIDER_SITE_OTHER): Payer: Medicare Other | Admitting: Family Medicine

## 2020-03-13 VITALS — BP 130/80 | HR 84 | Temp 97.7°F | Resp 19 | Ht 63.0 in | Wt 153.2 lb

## 2020-03-13 DIAGNOSIS — E039 Hypothyroidism, unspecified: Secondary | ICD-10-CM

## 2020-03-13 DIAGNOSIS — E663 Overweight: Secondary | ICD-10-CM

## 2020-03-13 DIAGNOSIS — F32A Depression, unspecified: Secondary | ICD-10-CM

## 2020-03-13 DIAGNOSIS — E785 Hyperlipidemia, unspecified: Secondary | ICD-10-CM | POA: Diagnosis not present

## 2020-03-13 LAB — HEPATIC FUNCTION PANEL
ALT: 14 U/L (ref 0–35)
AST: 17 U/L (ref 0–37)
Albumin: 4.2 g/dL (ref 3.5–5.2)
Alkaline Phosphatase: 65 U/L (ref 39–117)
Bilirubin, Direct: 0.1 mg/dL (ref 0.0–0.3)
Total Bilirubin: 0.8 mg/dL (ref 0.2–1.2)
Total Protein: 6.8 g/dL (ref 6.0–8.3)

## 2020-03-13 LAB — BASIC METABOLIC PANEL
BUN: 10 mg/dL (ref 6–23)
CO2: 28 mEq/L (ref 19–32)
Calcium: 9.5 mg/dL (ref 8.4–10.5)
Chloride: 103 mEq/L (ref 96–112)
Creatinine, Ser: 0.64 mg/dL (ref 0.40–1.20)
GFR: 82.33 mL/min (ref >60.00)
Glucose, Bld: 90 mg/dL (ref 70–99)
Potassium: 3.9 mEq/L (ref 3.5–5.1)
Sodium: 137 mEq/L (ref 135–145)

## 2020-03-13 LAB — LIPID PANEL
Cholesterol: 179 mg/dL (ref 0–200)
HDL: 58.3 mg/dL (ref >39.00)
LDL Cholesterol: 94 mg/dL (ref 0–99)
NonHDL: 121.08
Total CHOL/HDL Ratio: 3
Triglycerides: 134 mg/dL (ref 0.0–149.0)
VLDL: 26.8 mg/dL (ref 0.0–40.0)

## 2020-03-13 LAB — CBC WITH DIFFERENTIAL/PLATELET
Basophils Absolute: 0.1 10*3/uL (ref 0.0–0.1)
Basophils Relative: 0.9 % (ref 0.0–3.0)
Eosinophils Absolute: 0.1 10*3/uL (ref 0.0–0.7)
Eosinophils Relative: 1.2 % (ref 0.0–5.0)
HCT: 41.5 % (ref 36.0–46.0)
Hemoglobin: 14.2 g/dL (ref 12.0–15.0)
Lymphocytes Relative: 22.9 % (ref 12.0–46.0)
Lymphs Abs: 1.4 10*3/uL (ref 0.7–4.0)
MCHC: 34.1 g/dL (ref 30.0–36.0)
MCV: 85.8 fl (ref 78.0–100.0)
Monocytes Absolute: 0.6 10*3/uL (ref 0.1–1.0)
Monocytes Relative: 9.9 % (ref 3.0–12.0)
Neutro Abs: 3.9 10*3/uL (ref 1.4–7.7)
Neutrophils Relative %: 65.1 % (ref 43.0–77.0)
Platelets: 335 10*3/uL (ref 150.0–400.0)
RBC: 4.83 Mil/uL (ref 3.87–5.11)
RDW: 13.4 % (ref 11.5–15.5)
WBC: 5.9 10*3/uL (ref 4.0–10.5)

## 2020-03-13 LAB — TSH: TSH: 0.81 u[IU]/mL (ref 0.35–4.50)

## 2020-03-13 MED ORDER — SERTRALINE HCL 25 MG PO TABS: 25.0000 mg | ORAL_TABLET | Freq: Every day | ORAL | 3 refills | Status: DC

## 2020-03-13 NOTE — Progress Notes (Signed)
   Subjective:    Patient ID: Sabrina Castaneda, female    DOB: Mar 30, 1935, 85 y.o.   MRN: [id number]  HPI Hyperlipidemia- chronic problem, on Lipitor 40mg  daily.  No CP, SOB, abd pain, N/V.  Hypothyroid- on Levothyroxine daily.  Pt reports energy level is good.  + dry skin.  No changes to skin/hair/nails  Overweight- pt reports she is trying to lose weight.  States she has cut back on her eating.  BMI is 27.14.  She is going to PT regularly for rotator cuff tear.  Depression- pt had a difficult childhood.  Father was an alcoholic.  She married young.  Husband died when she was 24 and she raised 4 children on her own.  Has been having intense feelings of sadness but was always told to keep her feelings to herself and be strong.  Daughter wants her to discuss the depression.   Review of Systems For ROS see HPI   This visit occurred during the SARS-CoV-2 public health emergency.  Safety protocols were in place, including screening questions prior to the visit, additional usage of staff PPE, and extensive cleaning of exam room while observing appropriate contact time as indicated for disinfecting solutions.       Objective:   Physical Exam Vitals reviewed.  Constitutional:      General: She is not in acute distress.    Appearance: Normal appearance. She is well-developed and well-nourished. She is not ill-appearing.  HENT:     Head: Normocephalic and atraumatic.  Eyes:     Extraocular Movements: EOM normal.     Conjunctiva/sclera: Conjunctivae normal.     Pupils: Pupils are equal, round, and reactive to light.  Neck:     Thyroid: No thyromegaly.  Cardiovascular:     Rate and Rhythm: Normal rate and regular rhythm.     Pulses: Normal pulses and intact distal pulses.     Heart sounds: Normal heart sounds. No murmur heard.   Pulmonary:     Effort: Pulmonary effort is normal. No respiratory distress.     Breath sounds: Normal breath sounds.  Abdominal:     General: There is no  distension.     Palpations: Abdomen is soft.     Tenderness: There is no abdominal tenderness.  Musculoskeletal:        General: No edema.     Cervical back: Normal range of motion and neck supple.     Right lower leg: No edema.     Left lower leg: No edema.  Lymphadenopathy:     Cervical: No cervical adenopathy.  Skin:    General: Skin is warm and dry.  Neurological:     Mental Status: She is alert and oriented to person, place, and time.  Psychiatric:        Mood and Affect: Mood and affect normal.        Behavior: Behavior normal.           Assessment & Plan:

## 2020-03-13 NOTE — Patient Instructions (Signed)
Follow up in 1 month to recheck mood We'll notify you of your lab results and make any changes if needed START the Sertraline once daily to help w/ mood.  This will take a few weeks to build up in your system so please give it time Continue to work on healthy diet and regular exercise- you look great! Call with any questions or concerns Hang in there!

## 2020-03-14 DIAGNOSIS — E663 Overweight: Secondary | ICD-10-CM | POA: Insufficient documentation

## 2020-03-14 DIAGNOSIS — F32A Depression, unspecified: Secondary | ICD-10-CM | POA: Insufficient documentation

## 2020-03-14 NOTE — Assessment & Plan Note (Signed)
New.  It seems as if pt has had sxs that she has been trying to hide for years.  Recently daughter has become concerned about her moods and encouraged her to discuss this.  She admits that some days are 'just so hard'.  She is upset with herself for feeling this way.  We had long discussion that depression is not logical and there doesn't need to be a particular trigger.  It is a serotonin abnormality.  After having this explained to her, she is willing to try low dose medication.  She is not open to counseling b/c the idea of 'telling a stranger my problems' would do more harm than good as it causes her severe anxiety.  Will start Sertraline 25mg  daily and monitor closely for improvement.  Pt expressed understanding and is in agreement w/ plan.

## 2020-03-14 NOTE — Assessment & Plan Note (Signed)
Chronic problem.  Currently asymptomatic w/ exception of dry skin.  Check labs.  Adjust meds prn

## 2020-03-14 NOTE — Assessment & Plan Note (Signed)
Chronic problem.  Tolerating Lipitor 40mg daily w/o difficulty.  Check labs.  Adjust meds prn  

## 2020-03-14 NOTE — Assessment & Plan Note (Signed)
BMI is 27.14  She reports she is now doing PT and has cut back on her eating.  Will continue to follow.

## 2020-03-15 ENCOUNTER — Other Ambulatory Visit: Payer: Self-pay

## 2020-03-15 ENCOUNTER — Ambulatory Visit: Payer: Medicare Other | Admitting: Physical Therapy

## 2020-03-15 ENCOUNTER — Encounter: Payer: Self-pay | Admitting: Physical Therapy

## 2020-03-15 DIAGNOSIS — M25511 Pain in right shoulder: Secondary | ICD-10-CM | POA: Diagnosis not present

## 2020-03-15 DIAGNOSIS — G8929 Other chronic pain: Secondary | ICD-10-CM

## 2020-03-15 DIAGNOSIS — M6281 Muscle weakness (generalized): Secondary | ICD-10-CM

## 2020-03-15 DIAGNOSIS — R252 Cramp and spasm: Secondary | ICD-10-CM

## 2020-03-15 DIAGNOSIS — R293 Abnormal posture: Secondary | ICD-10-CM

## 2020-03-15 DIAGNOSIS — M546 Pain in thoracic spine: Secondary | ICD-10-CM

## 2020-03-15 NOTE — Therapy (Signed)
[facility] [address] [place], [place], [address] Phone: [phone]   Fax:  [phone]  Physical Therapy Treatment  Patient Details  Name: Sabrina Castaneda MRN: [id number] Date of Birth: 03-13-1935 Referring Provider (PT): Francena Hanly, MD   Encounter Date: 03/15/2020   PT End of Session - 03/15/20 1105    Visit Number 4    Date for PT Re-Evaluation 05/12/20    Authorization Type [employer] Medicare    Progress Note Due on Visit 10    PT Start Time 1103    PT Stop Time 1141    PT Time Calculation (min) 38 min    Activity Tolerance Patient tolerated treatment well    Behavior During Therapy [facility] for tasks assessed/performed           Past Medical History:  Diagnosis Date  . Allergy   . GERD (gastroesophageal reflux disease)   . History of chicken pox   . Hyperlipidemia   . Scoliosis   . Thyroid disease     Past Surgical History:  Procedure Laterality Date  . ABDOMINAL HYSTERECTOMY  1980  . ADENOIDECTOMY    . HERNIA REPAIR  02/04/1966  . REPLACEMENT TOTAL KNEE Right   . TONSILLECTOMY    . WRIST FRACTURE SURGERY Bilateral     There were no vitals filed for this visit.   Subjective Assessment - 03/15/20 1106    Subjective Pt denies pain today.    Pertinent History 2021 Rt shoulder MRI + findings (see below), scoliosis with Rt convexity, Rt TKR, bil wrist fracture surgery from 2 separate falls, bone density test has been ordered, no recent Hx of falls    Currently in Pain? No/denies    Multiple Pain Sites No              [facility] PT Assessment - 03/15/20 0001      AROM   Right Shoulder Flexion 140 Degrees                         [facility] Adult PT Treatment/Exercise - 03/15/20 0001      Shoulder Exercises: Supine   Protraction AROM;Strengthening;Right;20 reps   0# 10X, 1# 10X   Flexion AROM;AAROM;Both;Right;20 reps;Other (comment)    Flexion Limitations to 90 deg   first set with cane    Diagonals Limitations seated UE ranger 20x in all planes    Other Supine Exercises closed chain circles 10x each way holding 1# wt VC for gentle scap retraction    Other Supine Exercises Decompression 8x 3 sec hold each      Shoulder Exercises: Seated   Row Strengthening;Theraband;10 reps    Theraband Level (Shoulder Row) Level 1 (Yellow)      Shoulder Exercises: Standing   Extension Strengthening;Both;10 reps;Theraband    Theraband Level (Shoulder Extension) Level 1 (Yellow)      Shoulder Exercises: ROM/Strengthening   UBE (Upper Arm Bike) L1 2x2 with PTa present to monitor                    PT Short Term Goals - 03/15/20 1125      PT SHORT TERM GOAL #1   Title Pt will be ind with initial HEP    Time 4    Period Weeks    Status Achieved    Target Date 03/17/20      PT SHORT TERM GOAL #2   Title pt will be educated  on benefits of spinal decompression and osteoporosis dos and donts    Time 4    Period Weeks    Status Achieved      PT SHORT TERM GOAL #3   Title Pt will report reduced Lt thoracic pain with Rt UE ADLs by at least 20%    Time 4    Period Weeks    Status On-going   not much change   Target Date 03/17/20             PT Long Term Goals - 02/18/20 0934      PT LONG TERM GOAL #1   Title Pt will be ind with advanced HEP    Time 12    Period Weeks    Status New    Target Date 05/12/20      PT LONG TERM GOAL #2   Title Pt will improve scapular and GH joint strength for functional reaching tasks with no or min strain on spine.    Time 12    Period Weeks    Status New    Target Date 05/12/20      PT LONG TERM GOAL #3   Title Pt will report reduced thoracic and Rt shoulder pain with daily activities and sleep by at least 60%    Time 12    Period Weeks    Status New    Target Date 05/12/20      PT LONG TERM GOAL #4   Title Pt will be able to verbalize and demonstate postural corrections and change of postion with daily activity to  reduce pain and improve alignment    Time 12    Period Weeks    Status New    Target Date 05/12/20      PT LONG TERM GOAL #5   Title -    Time --    Period Weeks    Status New    Target Date --                 Plan - 03/15/20 1105    Clinical Impression Statement Pt reports her shoulder is not hurting today and her thoracic pain is unchanged ( comes and goes). Pt performed a combination of supine and seated/standing exercises for shoulder AROM and gentle stabilization. Pt is independent in her decompression HEP. Pt did not have any increase in her shoulder pain with todays exercises.    Personal Factors and Comorbidities Age;Comorbidity 1    Comorbidities scoliosis    Examination-Activity Limitations Lift;Reach Overhead;Sleep;Dressing;Bed Mobility    Examination-Participation Restrictions Community Activity;Shop    Stability/Clinical Decision Making Stable/Uncomplicated    Rehab Potential Excellent    PT Frequency 2x / week    PT Duration 12 weeks    PT Treatment/Interventions ADLs/Self Care Home Management;Cryotherapy;Moist Heat;Iontophoresis 4mg /ml Dexamethasone;Electrical Stimulation;Functional mobility training;Therapeutic activities;Therapeutic exercise;Neuromuscular re-education;Patient/family education;Manual techniques;Passive range of motion;Joint Manipulations;Spinal Manipulations    PT Next Visit Plan monitor Lt back pain and shoulder soreness, AA/ROM Rt shoulder, review HEP, spinal decompression as needed, Pt may do better staying supine for ROM and ther ex    PT Home Exercise Plan Access Code: [email or web address] + spinal decompression series    Consulted and Agree with Plan of Care Patient           Patient will benefit from skilled therapeutic intervention in order to improve the following deficits and impairments:  Decreased range of motion,Impaired UE functional use,Postural dysfunction,Decreased strength,Improper body mechanics,Pain,Hypomobility,Decreased  mobility,Increased muscle spasms  Visit  Diagnosis: Chronic right shoulder pain  Pain in thoracic spine  Abnormal posture  Muscle weakness (generalized)  Cramp and spasm     Problem List Patient Active Problem List   Diagnosis Date Noted  . Depression 03/14/2020  . Overweight (BMI 25.0-29.9) 03/14/2020  . Impingement syndrome of right shoulder region 03/01/2020  . Physical exam 09/10/2019  . Hyperlipidemia 08/16/2019  . Hypothyroid 08/16/2019  . Diplopia 03/09/2012  . Female stress incontinence 12/05/2010    COCHRAN,JENNIFER, PTA 03/15/2020, 11:45 AM  [facility] [address], [address] [place], [place], [address] Phone: [phone]   Fax:  [phone]  Name: BROOKE STEINHILBER MRN: [id number] Date of Birth: Nov 09, 1935

## 2020-03-17 ENCOUNTER — Encounter: Payer: Self-pay | Admitting: Physical Therapy

## 2020-03-17 ENCOUNTER — Other Ambulatory Visit: Payer: Self-pay

## 2020-03-17 ENCOUNTER — Ambulatory Visit: Payer: Medicare Other | Admitting: Physical Therapy

## 2020-03-17 DIAGNOSIS — M25511 Pain in right shoulder: Secondary | ICD-10-CM

## 2020-03-17 DIAGNOSIS — R293 Abnormal posture: Secondary | ICD-10-CM

## 2020-03-17 DIAGNOSIS — G8929 Other chronic pain: Secondary | ICD-10-CM

## 2020-03-17 DIAGNOSIS — M546 Pain in thoracic spine: Secondary | ICD-10-CM

## 2020-03-17 DIAGNOSIS — M6281 Muscle weakness (generalized): Secondary | ICD-10-CM

## 2020-03-17 NOTE — Therapy (Signed)
[facility] [address] [place], [place], [address] Phone: [phone]   Fax:  [phone]  Physical Therapy Treatment  Patient Details  Name: Sabrina Castaneda MRN: [id number] Date of Birth: 10-02-1935 Referring Provider (PT): Francena Hanly, MD   Encounter Date: 03/17/2020   PT End of Session - 03/17/20 0932    Visit Number 5    Date for PT Re-Evaluation 05/12/20    Authorization Type [employer] Medicare    Progress Note Due on Visit 10    PT Start Time 0932    PT Stop Time 1010    PT Time Calculation (min) 38 min    Activity Tolerance Patient tolerated treatment well    Behavior During Therapy [facility] for tasks assessed/performed           Past Medical History:  Diagnosis Date  . Allergy   . GERD (gastroesophageal reflux disease)   . History of chicken pox   . Hyperlipidemia   . Scoliosis   . Thyroid disease     Past Surgical History:  Procedure Laterality Date  . ABDOMINAL HYSTERECTOMY  1980  . ADENOIDECTOMY    . HERNIA REPAIR  02/04/1966  . REPLACEMENT TOTAL KNEE Right   . TONSILLECTOMY    . WRIST FRACTURE SURGERY Bilateral     There were no vitals filed for this visit.   Subjective Assessment - 03/17/20 0935    Subjective My back gave me a fit yesterday but my shoulder is feeling good.    Pertinent History 2021 Rt shoulder MRI + findings (see below), scoliosis with Rt convexity, Rt TKR, bil wrist fracture surgery from 2 separate falls, bone density test has been ordered, no recent Hx of falls    Limitations House hold activities;Lifting;Other (comment)    Diagnostic tests Rt shoulder MRI: RC and bicep tendinopathy, small full-thickness tear of anterior supraspinatus, AC joint arthritis, degenerative labrum    Patient Stated Goals build up shoulder strength    Currently in Pain? No/denies                             [facility] Adult PT Treatment/Exercise - 03/17/20 0001      Exercises    Exercises Shoulder;Neck      Neck Exercises: Seated   Cervical Rotation 5 reps    Other Seated Exercise thoracic rotation 3x5 sec each way      Shoulder Exercises: Supine   Other Supine Exercises spinal decompression series 5x5" each neck retraction, scap retraction, leg      Shoulder Exercises: Seated   Extension Strengthening;Both;10 reps;Weights    Row Strengthening;Theraband;10 reps;20 reps    Theraband Level (Shoulder Row) Level 1 (Yellow);Level 2 (Red)    Row Limitations last 5 red    External Rotation Strengthening;Theraband;20 reps    Theraband Level (Shoulder External Rotation) Level 1 (Yellow)    External Rotation Limitations 2x10      Shoulder Exercises: ROM/Strengthening   UBE (Upper Arm Bike) L1 2x2 with PTa present to monitor                    PT Short Term Goals - 03/15/20 1125      PT SHORT TERM GOAL #1   Title Pt will be ind with initial HEP    Time 4    Period Weeks    Status Achieved    Target Date 03/17/20      PT  SHORT TERM GOAL #2   Title pt will be educated on benefits of spinal decompression and osteoporosis dos and donts    Time 4    Period Weeks    Status Achieved      PT SHORT TERM GOAL #3   Title Pt will report reduced Lt thoracic pain with Rt UE ADLs by at least 20%    Time 4    Period Weeks    Status On-going   not much change   Target Date 03/17/20             PT Long Term Goals - 02/18/20 0934      PT LONG TERM GOAL #1   Title Pt will be ind with advanced HEP    Time 12    Period Weeks    Status New    Target Date 05/12/20      PT LONG TERM GOAL #2   Title Pt will improve scapular and GH joint strength for functional reaching tasks with no or min strain on spine.    Time 12    Period Weeks    Status New    Target Date 05/12/20      PT LONG TERM GOAL #3   Title Pt will report reduced thoracic and Rt shoulder pain with daily activities and sleep by at least 60%    Time 12    Period Weeks    Status New     Target Date 05/12/20      PT LONG TERM GOAL #4   Title Pt will be able to verbalize and demonstate postural corrections and change of postion with daily activity to reduce pain and improve alignment    Time 12    Period Weeks    Status New    Target Date 05/12/20      PT LONG TERM GOAL #5   Title -    Time --    Period Weeks    Status New    Target Date --                 Plan - 03/17/20 1010    Clinical Impression Statement Pt arrived without pain but did note increased Lt thoracic pain to 5/10 with attempt for her to do second set of yellow seated shoulder tband exercises.  Supine spinal decompression improved pain which continues to be a good tool for pain management and relief which she does daily at home.  PT and Pt discussed how 1 set of tband ther ex may be enough for her to maintain good postural strength without exacerbation of pain.  PT encouraged her to alternate aggravating activities and postures throughout her day to manage pain at 3-4/10 or below.  Pt admitted it is hard for her to accept that she can't push herself more.  Continue with 1 set exercises to tolerance and AA/ROM and A/ROM of Rt shoulder to prevent stiffness.  Ongoing awareness of postural cueing at home and within sessions will continue to benefit Pt.    Comorbidities scoliosis    PT Frequency 2x / week    PT Duration 12 weeks    PT Treatment/Interventions ADLs/Self Care Home Management;Cryotherapy;Moist Heat;Iontophoresis 4mg /ml Dexamethasone;Electrical Stimulation;Functional mobility training;Therapeutic activities;Therapeutic exercise;Neuromuscular re-education;Patient/family education;Manual techniques;Passive range of motion;Joint Manipulations;Spinal Manipulations    PT Next Visit Plan stay supine for ther ex, aim for thoracic pain management between 3-4/10 or less with ther ex    PT Home Exercise Plan Access Code: [email or web address] +  spinal decompression series    Consulted and Agree with Plan of Care  Patient           Patient will benefit from skilled therapeutic intervention in order to improve the following deficits and impairments:     Visit Diagnosis: Chronic right shoulder pain  Pain in thoracic spine  Abnormal posture  Muscle weakness (generalized)     Problem List Patient Active Problem List   Diagnosis Date Noted  . Depression 03/14/2020  . Overweight (BMI 25.0-29.9) 03/14/2020  . Impingement syndrome of right shoulder region 03/01/2020  . Physical exam 09/10/2019  . Hyperlipidemia 08/16/2019  . Hypothyroid 08/16/2019  . Diplopia 03/09/2012  . Female stress incontinence 12/05/2010    Doyce Para Angle Dirusso 03/17/2020, 10:15 AM  [facility] [address] [place], [place], [address] Phone: [phone]   Fax:  [phone]  Name: Sabrina Castaneda MRN: [id number] Date of Birth: 1935/02/12

## 2020-03-22 ENCOUNTER — Encounter: Payer: Self-pay | Admitting: Physical Therapy

## 2020-03-22 ENCOUNTER — Ambulatory Visit: Payer: Medicare Other | Admitting: Physical Therapy

## 2020-03-22 ENCOUNTER — Other Ambulatory Visit: Payer: Self-pay

## 2020-03-22 DIAGNOSIS — G8929 Other chronic pain: Secondary | ICD-10-CM

## 2020-03-22 DIAGNOSIS — R252 Cramp and spasm: Secondary | ICD-10-CM

## 2020-03-22 DIAGNOSIS — M25511 Pain in right shoulder: Secondary | ICD-10-CM | POA: Diagnosis not present

## 2020-03-22 DIAGNOSIS — M6281 Muscle weakness (generalized): Secondary | ICD-10-CM

## 2020-03-22 DIAGNOSIS — R293 Abnormal posture: Secondary | ICD-10-CM

## 2020-03-22 DIAGNOSIS — M546 Pain in thoracic spine: Secondary | ICD-10-CM

## 2020-03-22 NOTE — Therapy (Signed)
[facility] [address], [address] [place], [place], [address] Phone: [phone]   Fax:  [phone]  Physical Therapy Treatment  Patient Details  Name: Sabrina Castaneda MRN: [id number] Date of Birth: [birth year] Referring Provider (PT): Francena Hanly, MD   Encounter Date: 03/22/2020   PT End of Session - 03/22/20 1028    Visit Number 6    Date for PT Re-Evaluation 05/12/20    Authorization Type [employer] Medicare    Progress Note Due on Visit 10    PT Start Time 1025   10 min late   PT Stop Time 1056    PT Time Calculation (min) 31 min    Activity Tolerance Patient tolerated treatment well    Behavior During Therapy [facility] for tasks assessed/performed           Past Medical History:  Diagnosis Date  . Allergy   . GERD (gastroesophageal reflux disease)   . History of chicken pox   . Hyperlipidemia   . Scoliosis   . Thyroid disease     Past Surgical History:  Procedure Laterality Date  . ABDOMINAL HYSTERECTOMY  1980  . ADENOIDECTOMY    . HERNIA REPAIR  02/04/1966  . REPLACEMENT TOTAL KNEE Right   . TONSILLECTOMY    . WRIST FRACTURE SURGERY Bilateral     There were no vitals filed for this visit.   Subjective Assessment - 03/22/20 1029    Subjective A little better, wish it were more and faster.    Pertinent History 2021 Rt shoulder MRI + findings (see below), scoliosis with Rt convexity, Rt TKR, bil wrist fracture surgery from 2 separate falls, bone density test has been ordered, no recent Hx of falls    Currently in Pain? Yes    Pain Score 4     Pain Location Shoulder    Pain Orientation Right    Pain Descriptors / Indicators Sore    Aggravating Factors  night time is painful    Pain Relieving Factors exercises are helpful    Multiple Pain Sites No                             [facility] Adult PT Treatment/Exercise - 03/22/20 0001      Shoulder Exercises: Supine   Horizontal ABduction  Strengthening;Both;10 reps;Theraband    Theraband Level (Shoulder Horizontal ABduction) Level 1 (Yellow)    External Rotation Strengthening;Both;10 reps;Theraband    Theraband Level (Shoulder External Rotation) Level 1 (Yellow)    Flexion AROM;AAROM;Strengthening;Both;20 reps;Weights    Flexion Limitations push ups with 2# on cane   Then overhead with 2# on cane 5x VC to control motion     Shoulder Exercises: ROM/Strengthening   UBE (Upper Arm Bike) L1 3x3 at end of session                    PT Short Term Goals - 03/15/20 1125      PT SHORT TERM GOAL #1   Title Pt will be ind with initial HEP    Time 4    Period Weeks    Status Achieved    Target Date 03/17/20      PT SHORT TERM GOAL #2   Title pt will be educated on benefits of spinal decompression and osteoporosis dos and donts    Time 4    Period Weeks    Status Achieved  PT SHORT TERM GOAL #3   Title Pt will report reduced Lt thoracic pain with Rt UE ADLs by at least 20%    Time 4    Period Weeks    Status On-going   not much change   Target Date 03/17/20             PT Long Term Goals - 02/18/20 0934      PT LONG TERM GOAL #1   Title Pt will be ind with advanced HEP    Time 12    Period Weeks    Status New    Target Date 05/12/20      PT LONG TERM GOAL #2   Title Pt will improve scapular and GH joint strength for functional reaching tasks with no or min strain on spine.    Time 12    Period Weeks    Status New    Target Date 05/12/20      PT LONG TERM GOAL #3   Title Pt will report reduced thoracic and Rt shoulder pain with daily activities and sleep by at least 60%    Time 12    Period Weeks    Status New    Target Date 05/12/20      PT LONG TERM GOAL #4   Title Pt will be able to verbalize and demonstate postural corrections and change of postion with daily activity to reduce pain and improve alignment    Time 12    Period Weeks    Status New    Target Date 05/12/20      PT  LONG TERM GOAL #5   Title -    Time --    Period Weeks    Status New    Target Date --                 Plan - 03/22/20 1042    Clinical Impression Statement Pt was 10 min late today. Overall she admitts she is better, she would just like it to go faster. Pt performed all supine exercises with some light resistance and have no pain increases. Pt plans to exercise in the pool where she lives.    Personal Factors and Comorbidities Age;Comorbidity 1    Comorbidities scoliosis    Examination-Activity Limitations Lift;Reach Overhead;Sleep;Dressing;Bed Mobility    Examination-Participation Restrictions Community Activity;Shop    Stability/Clinical Decision Making Stable/Uncomplicated    Rehab Potential Excellent    PT Frequency 2x / week    PT Duration 12 weeks    PT Treatment/Interventions ADLs/Self Care Home Management;Cryotherapy;Moist Heat;Iontophoresis 4mg /ml Dexamethasone;Electrical Stimulation;Functional mobility training;Therapeutic activities;Therapeutic exercise;Neuromuscular re-education;Patient/family education;Manual techniques;Passive range of motion;Joint Manipulations;Spinal Manipulations    PT Next Visit Plan stay supine for ther ex, aim for thoracic pain management between 3-4/10 or less with ther ex: pt would like some aquatic shoulder exercises to do in the pool at [facility][birth year][facility]. She will have an instructor to help her.    PT Home Exercise Plan Access Code: [email or web address] + spinal decompression series    Consulted and Agree with Plan of Care Patient           Patient will benefit from skilled therapeutic intervention in order to improve the following deficits and impairments:  Decreased range of motion,Impaired UE functional use,Postural dysfunction,Decreased strength,Improper body mechanics,Pain,Hypomobility,Decreased mobility,Increased muscle spasms  Visit Diagnosis: Chronic right shoulder pain  Pain in thoracic spine  Abnormal posture  Muscle weakness  (generalized)  Cramp and spasm     Problem List  Patient Active Problem List   Diagnosis Date Noted  . Depression 03/14/2020  . Overweight (BMI 25.0-29.9) 03/14/2020  . Impingement syndrome of right shoulder region 03/01/2020  . Physical exam 09/10/2019  . Hyperlipidemia 08/16/2019  . Hypothyroid 08/16/2019  . Diplopia 03/09/2012  . Female stress incontinence 12/05/2010    Myrissa Chipley, PTA 03/22/2020, 10:57 AM  [facility] [address], [address] [place], [place], [address] Phone: [phone]   Fax:  [phone]  Name: Sabrina Castaneda MRN: [id number] Date of Birth: 01-04-1936

## 2020-03-24 ENCOUNTER — Ambulatory Visit: Payer: Medicare Other | Admitting: Physical Therapy

## 2020-03-24 ENCOUNTER — Encounter: Payer: Self-pay | Admitting: Physical Therapy

## 2020-03-24 ENCOUNTER — Other Ambulatory Visit: Payer: Self-pay

## 2020-03-24 DIAGNOSIS — R293 Abnormal posture: Secondary | ICD-10-CM

## 2020-03-24 DIAGNOSIS — G8929 Other chronic pain: Secondary | ICD-10-CM

## 2020-03-24 DIAGNOSIS — M25511 Pain in right shoulder: Secondary | ICD-10-CM

## 2020-03-24 DIAGNOSIS — M6281 Muscle weakness (generalized): Secondary | ICD-10-CM

## 2020-03-24 DIAGNOSIS — M546 Pain in thoracic spine: Secondary | ICD-10-CM

## 2020-03-24 NOTE — Patient Instructions (Signed)
Access Code: [email or web address] URL: [email or web address] Date: 03/24/2020 Prepared by: Loistine Simas Torryn Hudspeth  Exercises Supine Shoulder External Rotation with Resistance - 2 x daily - 7 x weekly - 1 sets - 15 reps Seated Shoulder Row with Anchored Resistance - 2 x daily - 7 x weekly - 1 sets - 10 reps Seated Shoulder Extension and Scapular Retraction with Resistance - 2 x daily - 7 x weekly - 1 sets - 10 reps Supine Single Arm Shoulder Protraction - 2 x daily - 7 x weekly - 1 sets - 15 reps Supine Shoulder Circles - 2 x daily - 7 x weekly - 1 sets - 10 reps Bilateral Shoulder Abduction Adduction AROM at Pool Wall - 1 x daily - 7 x weekly - 1 sets - 10 reps Bilateral Shoulder Horizontal Abduction Adduction AROM at Pool Wall - 1 x daily - 7 x weekly - 1 sets - 10 reps Bilateral Shoulder Flexion Extension AROM at [employer] - 1 x daily - 7 x weekly - 1 sets - 10 reps Bilateral Shoulder External Rotation and Internal Rotation AROM at Pool Wall - 1 x daily - 7 x weekly - 1 sets - 10 reps Plank at [employer] - 1 x daily - 7 x weekly - 1 sets - 3 reps - 10 hold Push-Up on Pool Wall - 1 x daily - 7 x weekly - 1 sets - 10 reps Asymmetrical Shoulder Flexion Extension AROM at [employer] - 1 x daily - 7 x weekly - 1 sets - 10 reps

## 2020-03-24 NOTE — Therapy (Signed)
[facility] [address] [place], [place], [address] Phone: [phone]   Fax:  [phone]  Physical Therapy Treatment  Patient Details  Name: Sabrina Castaneda MRN: [id number] Date of Birth: August 28, 1935 Referring Provider (PT): Justice Britain, MD   Encounter Date: 03/24/2020   PT End of Session - 03/24/20 1011    Visit Number 7    Date for PT Re-Evaluation 05/12/20    Authorization Type UHC Medicare    Progress Note Due on Visit 10    PT Start Time 0935    PT Stop Time 1013    PT Time Calculation (min) 38 min    Activity Tolerance Patient tolerated treatment well    Behavior During Therapy [facility] for tasks assessed/performed           Past Medical History:  Diagnosis Date  . Allergy   . GERD (gastroesophageal reflux disease)   . History of chicken pox   . Hyperlipidemia   . Scoliosis   . Thyroid disease     Past Surgical History:  Procedure Laterality Date  . ABDOMINAL HYSTERECTOMY  1980  . ADENOIDECTOMY    . HERNIA REPAIR  02/04/1966  . REPLACEMENT TOTAL KNEE Right   . TONSILLECTOMY    . WRIST FRACTURE SURGERY Bilateral     There were no vitals filed for this visit.   Subjective Assessment - 03/24/20 0937    Subjective No shoulder pain, just some Lt midback pain.  I may need to take the doctor up on an injection.    Pertinent History 2021 Rt shoulder MRI + findings (see below), scoliosis with Rt convexity, Rt TKR, bil wrist fracture surgery from 2 separate falls, bone density test has been ordered, no recent Hx of falls    Limitations House hold activities;Lifting;Other (comment)    Diagnostic tests Rt shoulder MRI: RC and bicep tendinopathy, small full-thickness tear of anterior supraspinatus, AC joint arthritis, degenerative labrum    Patient Stated Goals build up shoulder strength    Currently in Pain? No/denies    Pain Location Back    Pain Orientation Left    Pain Type Chronic pain                              [facility] Adult PT Treatment/Exercise - 03/24/20 0001      Self-Care   Self-Care Other Self-Care Comments    Other Self-Care Comments  review and build out of aquatic exercises Pt can do at her facility      Exercises   Exercises Shoulder;Neck;Elbow      Elbow Exercises   Other elbow exercises bil elbow flexion 1x20 2lb      Shoulder Exercises: Supine   Protraction AROM;Both;10 reps    Protraction Limitations 1x10 cane    Horizontal ABduction Strengthening;Theraband;20 reps    Theraband Level (Shoulder Horizontal ABduction) Level 1 (Yellow)    Horizontal ABduction Limitations 2x10    External Rotation Strengthening;Both;Theraband;20 reps    Theraband Level (Shoulder External Rotation) Level 1 (Yellow)    External Rotation Limitations 2x10    Flexion AROM;Strengthening;Both;20 reps;Weights;10 reps    Shoulder Flexion Weight (lbs) 2    Flexion Limitations 1x10 cane, 1x20 2lb weights    Other Supine Exercises extension press into table elbows bent to 90 deg 10x5 sec holds    Other Supine Exercises elbow flexion 2lb bil UEs 15 reps      Shoulder Exercises:  ROM/Strengthening   UBE (Upper Arm Bike) L1 3x3 beginning of session    Proximal Shoulder Strengthening, Supine circles Rt UE at 90 deg 1x10 1lb then 1x10 2lb                    PT Short Term Goals - 03/24/20 1130      PT SHORT TERM GOAL #3   Title Pt will report reduced Lt thoracic pain with Rt UE ADLs by at least 20%    Baseline depends on the activity and day    Status Partially Met             PT Long Term Goals - 03/24/20 1130      PT LONG TERM GOAL #1   Title Pt will be ind with advanced HEP    Status On-going      PT LONG TERM GOAL #2   Title Pt will improve scapular and GH joint strength for functional reaching tasks with no or min strain on spine.    Status On-going      PT LONG TERM GOAL #3   Title Pt will report reduced thoracic and Rt shoulder pain with  daily activities and sleep by at least 60%    Status On-going      PT LONG TERM GOAL #4   Title Pt will be able to verbalize and demonstate postural corrections and change of postion with daily activity to reduce pain and improve alignment    Status Achieved                 Plan - 03/24/20 1126    Clinical Impression Statement Pt continues to be challenged without pain aggravation with UBE 3x3 each way.  Her back pain limits her from upright ther ex for shoulder strength so progression of ther ex today was performed supine.  She was able to add second set with yellow band for several exercises today and may be ready to progress to red band next visit.  Pt has access to pool at living facility so inquired about water exercises for shoulders.  PT updated HEP for appropriate UE aquatic ther ex and reviewed with Pt.  Continue along POC with progression of resistance trial next visit.    Comorbidities scoliosis    PT Frequency 2x / week    PT Duration 12 weeks    PT Treatment/Interventions ADLs/Self Care Home Management;Cryotherapy;Moist Heat;Iontophoresis 7m/ml Dexamethasone;Electrical Stimulation;Functional mobility training;Therapeutic activities;Therapeutic exercise;Neuromuscular re-education;Patient/family education;Manual techniques;Passive range of motion;Joint Manipulations;Spinal Manipulations    PT Next Visit Plan stay supine for ther ex, continue UBE 3x3, f/u on aquatic ther ex ideas given last visit, try red band for some ther ex, increase supine bicep curl to 3lb    PT Home Exercise Plan Access Code: [email or web address]+ spinal decompression series    Consulted and Agree with Plan of Care Patient           Patient will benefit from skilled therapeutic intervention in order to improve the following deficits and impairments:     Visit Diagnosis: Chronic right shoulder pain  Pain in thoracic spine  Abnormal posture  Muscle weakness (generalized)     Problem List Patient  Active Problem List   Diagnosis Date Noted  . Depression 03/14/2020  . Overweight (BMI 25.0-29.9) 03/14/2020  . Impingement syndrome of right shoulder region 03/01/2020  . Physical exam 09/10/2019  . Hyperlipidemia 08/16/2019  . Hypothyroid 08/16/2019  . Diplopia 03/09/2012  . Female stress incontinence 12/05/2010  Venetia Night E Thermon Zulauf 03/24/2020, 11:31 AM  [facility] [address] [place], [place], [address] Phone: [phone]   Fax:  [phone]  Name: Sabrina Castaneda MRN: [id number] Date of Birth: 04-Nov-1935

## 2020-03-29 ENCOUNTER — Encounter: Payer: Medicare Other | Admitting: Physical Therapy

## 2020-03-31 ENCOUNTER — Other Ambulatory Visit: Payer: Self-pay

## 2020-03-31 ENCOUNTER — Encounter: Payer: Self-pay | Admitting: Physical Therapy

## 2020-03-31 ENCOUNTER — Ambulatory Visit: Payer: Medicare Other | Admitting: Physical Therapy

## 2020-03-31 DIAGNOSIS — M6281 Muscle weakness (generalized): Secondary | ICD-10-CM

## 2020-03-31 DIAGNOSIS — R293 Abnormal posture: Secondary | ICD-10-CM

## 2020-03-31 DIAGNOSIS — M546 Pain in thoracic spine: Secondary | ICD-10-CM

## 2020-03-31 DIAGNOSIS — M25511 Pain in right shoulder: Secondary | ICD-10-CM | POA: Diagnosis not present

## 2020-03-31 DIAGNOSIS — G8929 Other chronic pain: Secondary | ICD-10-CM

## 2020-03-31 NOTE — Therapy (Signed)
[facility] [address], [place], [place], [address] Phone: [phone]   Fax:  [phone]  Physical Therapy Treatment  Patient Details  Name: Sabrina Castaneda MRN: [id number] Date of Birth: 03/17/1935 Referring Provider (PT): Justice Britain, MD   Encounter Date: 03/31/2020   PT End of Session - 03/31/20 0938    Visit Number 8    Date for PT Re-Evaluation 05/12/20    Authorization Type [employer] Medicare    Progress Note Due on Visit 10    PT Start Time 0933    PT Stop Time 1011    PT Time Calculation (min) 38 min    Activity Tolerance Patient tolerated treatment well    Behavior During Therapy [facility] for tasks assessed/performed           Past Medical History:  Diagnosis Date  . Allergy   . GERD (gastroesophageal reflux disease)   . History of chicken pox   . Hyperlipidemia   . Scoliosis   . Thyroid disease     Past Surgical History:  Procedure Laterality Date  . ABDOMINAL HYSTERECTOMY  1980  . ADENOIDECTOMY    . HERNIA REPAIR  02/04/1966  . REPLACEMENT TOTAL KNEE Right   . TONSILLECTOMY    . WRIST FRACTURE SURGERY Bilateral     There were no vitals filed for this visit.   Subjective Assessment - 03/31/20 0932    Subjective I tripped over my daughter's dog and went down on my Rt knee and caught myself with my arms and I am not having any pain so I am really pleased - I think I am stronger so was able to fall and not injury myself.  I feel like my balance has improved since starting PT.    Pertinent History 2021 Rt shoulder MRI + findings (see below), scoliosis with Rt convexity, Rt TKR, bil wrist fracture surgery from 2 separate falls, bone density test has been ordered, no recent Hx of falls    Limitations House hold activities;Lifting;Other (comment)    Diagnostic tests Rt shoulder MRI: RC and bicep tendinopathy, small full-thickness tear of anterior supraspinatus, AC joint arthritis, degenerative labrum     Patient Stated Goals build up shoulder strength    Currently in Pain? No/denies    Pain Type Chronic pain                             [facility] Adult PT Treatment/Exercise - 03/31/20 0001      Exercises   Exercises Shoulder;Neck;Elbow      Elbow Exercises   Elbow Flexion Strengthening;Both;20 reps;Supine;Theraband    Elbow Flexion Limitations 3lb dumbbells      Neck Exercises: Supine   Neck Retraction 5 reps;5 secs    Other Supine Exercise scapular retraction into mat table 5x5"      Shoulder Exercises: Supine   Protraction Strengthening;Right;10 reps;Left;Weights    Protraction Weight (lbs) 2    Horizontal ABduction Strengthening;Both;20 reps;Theraband    Theraband Level (Shoulder Horizontal ABduction) Level 1 (Yellow)    Horizontal ABduction Limitations 2x10    External Rotation Strengthening;Both;20 reps;Theraband    Theraband Level (Shoulder External Rotation) Level 1 (Yellow)    External Rotation Limitations 2x10    Flexion Strengthening;Both;Weights    Shoulder Flexion Weight (lbs) 2    Flexion Limitations dumbbells in bil hands    Diagonals Strengthening;Both;10 reps;Theraband    Theraband Level (Shoulder Diagonals) Level 1 (Yellow)  Diagonals Limitations draw the sword      Shoulder Exercises: ROM/Strengthening   UBE (Upper Arm Bike) L1.5 3x3 beginning of session, PT present to discuss status    Proximal Shoulder Strengthening, Supine circles at 90 deg, Rt, 20 each way, 2lb                    PT Short Term Goals - 03/24/20 1130      PT SHORT TERM GOAL #3   Title Pt will report reduced Lt thoracic pain with Rt UE ADLs by at least 20%    Baseline depends on the activity and day    Status Partially Met             PT Long Term Goals - 03/24/20 1130      PT LONG TERM GOAL #1   Title Pt will be ind with advanced HEP    Status On-going      PT LONG TERM GOAL #2   Title Pt will improve scapular and GH joint strength for functional  reaching tasks with no or min strain on spine.    Status On-going      PT LONG TERM GOAL #3   Title Pt will report reduced thoracic and Rt shoulder pain with daily activities and sleep by at least 60%    Status On-going      PT LONG TERM GOAL #4   Title Pt will be able to verbalize and demonstate postural corrections and change of postion with daily activity to reduce pain and improve alignment    Status Achieved                 Plan - 03/31/20 1005    Clinical Impression Statement Pt continues to be compliant with HEP and reports a feeling of improved strength and balance since starting PT.  She was able to do UBE 3x3 at increased resistance today (1.5 vs 1.0) and increased supine weights to 2 and 3lb since she picked up 2lb weights to use for HEP.  PT introduced supine diagonals with yellow band and 2lb handheld dumbbells for bil shoulder flexion to 90 deg supine with good tolerance.  She demo'd much improved ability to perform shoulder protraction serratus punch today compared to previous visits.  Fatigue but no pain end of session.  PT monitoring for readiness to increase to red band from yellow.    Comorbidities scoliosis    Rehab Potential Excellent    PT Frequency 2x / week    PT Duration 12 weeks    PT Treatment/Interventions ADLs/Self Care Home Management;Cryotherapy;Moist Heat;Iontophoresis 24m/ml Dexamethasone;Electrical Stimulation;Functional mobility training;Therapeutic activities;Therapeutic exercise;Neuromuscular re-education;Patient/family education;Manual techniques;Passive range of motion;Joint Manipulations;Spinal Manipulations    PT Next Visit Plan stay supine for ther ex, continue UBE 3x3, f/u on aquatic ther ex ideas given last visit, try red band for some ther ex, increase supine bicep curl to 3lb and circles to 2lb    PT Home Exercise Plan Access Code: [email or web address]+ spinal decompression series    Consulted and Agree with Plan of Care Patient           Patient  will benefit from skilled therapeutic intervention in order to improve the following deficits and impairments:     Visit Diagnosis: Chronic right shoulder pain  Pain in thoracic spine  Abnormal posture  Muscle weakness (generalized)     Problem List Patient Active Problem List   Diagnosis Date Noted  . Depression 03/14/2020  . Overweight (  BMI 25.0-29.9) 03/14/2020  . Impingement syndrome of right shoulder region 03/01/2020  . Physical exam 09/10/2019  . Hyperlipidemia 08/16/2019  . Hypothyroid 08/16/2019  . Diplopia 03/09/2012  . Female stress incontinence 12/05/2010    Alene Mires Yreka 03/31/2020, 10:12 AM  [facility] [address], [place], [place], [address] Phone: [phone]   Fax:  [phone]  Name: Sabrina Castaneda MRN: [id number] Date of Birth: 1935-11-05

## 2020-04-05 ENCOUNTER — Other Ambulatory Visit: Payer: Self-pay | Admitting: Family Medicine

## 2020-04-05 ENCOUNTER — Ambulatory Visit: Payer: Medicare Other | Admitting: Allergy

## 2020-04-12 ENCOUNTER — Other Ambulatory Visit: Payer: Self-pay

## 2020-04-12 ENCOUNTER — Encounter: Payer: Self-pay | Admitting: Physical Therapy

## 2020-04-12 ENCOUNTER — Ambulatory Visit: Payer: Medicare Other | Attending: Orthopedic Surgery | Admitting: Physical Therapy

## 2020-04-12 DIAGNOSIS — M6281 Muscle weakness (generalized): Secondary | ICD-10-CM | POA: Diagnosis present

## 2020-04-12 DIAGNOSIS — M546 Pain in thoracic spine: Secondary | ICD-10-CM | POA: Insufficient documentation

## 2020-04-12 DIAGNOSIS — R293 Abnormal posture: Secondary | ICD-10-CM | POA: Diagnosis present

## 2020-04-12 DIAGNOSIS — G8929 Other chronic pain: Secondary | ICD-10-CM | POA: Insufficient documentation

## 2020-04-12 DIAGNOSIS — M25511 Pain in right shoulder: Secondary | ICD-10-CM | POA: Insufficient documentation

## 2020-04-12 NOTE — Therapy (Signed)
[facility] [address], [address] , [place], [address] Phone: [phone]   Fax:  [phone]  Physical Therapy Treatment  Patient Details  Name: Sabrina Castaneda MRN: [id number] Date of Birth: 02/22/1935 Referring Provider (PT): Supple, Kevin, MD   Encounter Date: 04/12/2020   PT End of Session - 04/12/20 1026    Visit Number 9    Date for PT Re-Evaluation 05/12/20    Authorization Type [employer] Medicare    Progress Note Due on Visit 10    PT Start Time 1015    PT Stop Time 1053    PT Time Calculation (min) 38 min    Activity Tolerance Patient tolerated treatment well    Behavior During Therapy WFL for tasks assessed/performed           Past Medical History:  Diagnosis Date  . Allergy   . GERD (gastroesophageal reflux disease)   . History of chicken pox   . Hyperlipidemia   . Scoliosis   . Thyroid disease     Past Surgical History:  Procedure Laterality Date  . ABDOMINAL HYSTERECTOMY  1980  . ADENOIDECTOMY    . HERNIA REPAIR  02/04/1966  . REPLACEMENT TOTAL KNEE Right   . TONSILLECTOMY    . WRIST FRACTURE SURGERY Bilateral     There were no vitals filed for this visit.   Subjective Assessment - 04/12/20 1025    Subjective Shoulder pain seems to come and go.  I do feel a benefit of walking better and improved balance with the exercises I'm doing in PT.  I will go tomorrow to the pool to set up some aquatic exercises.    Pertinent History 2021 Rt shoulder MRI + findings (see below), scoliosis with Rt convexity, Rt TKR, bil wrist fracture surgery from 2 separate falls, bone density test has been ordered, no recent Hx of falls    Limitations House hold activities;Lifting;Other (comment)    Diagnostic tests Rt shoulder MRI: RC and bicep tendinopathy, small full-thickness tear of anterior supraspinatus, AC joint arthritis, degenerative labrum    Patient Stated Goals build up shoulder strength    Currently in Pain?  No/denies   comes/goes   Pain Type Chronic pain                             [facility] Adult PT Treatment/Exercise - 04/12/20 0001      Exercises   Exercises Shoulder;Neck      Elbow Exercises   Elbow Flexion Limitations 4lb dumbbells 2x10 bil      Shoulder Exercises: Supine   Protraction Strengthening;Right;10 reps;Left;Weights    Protraction Weight (lbs) 2.5    Protraction Limitations ankle weight on dowel    Horizontal ABduction Strengthening;Both;20 reps;Theraband    Theraband Level (Shoulder Horizontal ABduction) Level 1 (Yellow)    Horizontal ABduction Limitations 2x10    External Rotation Strengthening;Both;20 reps;Theraband    Theraband Level (Shoulder External Rotation) Level 1 (Yellow)    External Rotation Limitations 2x10    Flexion AAROM;Strengthening;20 reps    Shoulder Flexion Weight (lbs) 2.5    Flexion Limitations ankle weight on dowel flexion to 100 deg    Diagonals Strengthening;Both;10 reps;Theraband    Theraband Level (Shoulder Diagonals) Level 1 (Yellow)    Diagonals Limitations opp direction arms D1/D2    Other Supine Exercises chest press dowel +2.5lb weight x 15      Shoulder Exercises: ROM/Strengthening   UBE (Upper   Arm Bike) L1.5 3x3 PT present to discuss progress and plan for pool ther ex tomorrow    Proximal Shoulder Strengthening, Supine circles at 90 deg, Rt, 2x20 each way, 2lb first set. 3lb 2nd set                    PT Short Term Goals - 03/24/20 1130      PT SHORT TERM GOAL #3   Title Pt will report reduced Lt thoracic pain with Rt UE ADLs by at least 20%    Baseline depends on the activity and day    Status Partially Met             PT Long Term Goals - 03/24/20 1130      PT LONG TERM GOAL #1   Title Pt will be ind with advanced HEP    Status On-going      PT LONG TERM GOAL #2   Title Pt will improve scapular and GH joint strength for functional reaching tasks with no or min strain on spine.    Status  On-going      PT LONG TERM GOAL #3   Title Pt will report reduced thoracic and Rt shoulder pain with daily activities and sleep by at least 60%    Status On-going      PT LONG TERM GOAL #4   Title Pt will be able to verbalize and demonstate postural corrections and change of postion with daily activity to reduce pain and improve alignment    Status Achieved                 Plan - 04/12/20 1049    Clinical Impression Statement Pt has some good days and some bad days with intermittent Rt shoulder pain.  She is compliant with HEP but needed reinforcement of tband diagonals today.  She was able to increase bicep curl weights and supine circle weights today with good work rate doing all ther ex back to back without rest.  She was not yet ready to advance to red band.  She is limited by Lt scapular pain related to scoliosis with upright postures so sessions spent mostly in supine for strengthening.  Slower progress due to co-morbidities and chronic nature of pain.    Comorbidities scoliosis    PT Frequency 2x / week    PT Duration 12 weeks    PT Treatment/Interventions ADLs/Self Care Home Management;Cryotherapy;Moist Heat;Iontophoresis 40m/ml Dexamethasone;Electrical Stimulation;Functional mobility training;Therapeutic activities;Therapeutic exercise;Neuromuscular re-education;Patient/family education;Manual techniques;Passive range of motion;Joint Manipulations;Spinal Manipulations    PT Next Visit Plan 10th visit PN next time, stay supine for ther ex, continue UBE 3x3, f/u on aquatic ther ex, 4lb biceps and supine circles    PT Home Exercise Plan Access Code: [email or web address]+ spinal decompression series    Consulted and Agree with Plan of Care Patient           Patient will benefit from skilled therapeutic intervention in order to improve the following deficits and impairments:     Visit Diagnosis: Chronic right shoulder pain  Pain in thoracic spine  Abnormal posture  Muscle weakness  (generalized)     Problem List Patient Active Problem List   Diagnosis Date Noted  . Depression 03/14/2020  . Overweight (BMI 25.0-29.9) 03/14/2020  . Impingement syndrome of right shoulder region 03/01/2020  . Physical exam 09/10/2019  . Hyperlipidemia 08/16/2019  . Hypothyroid 08/16/2019  . Diplopia 03/09/2012  . Female stress incontinence 12/05/2010    JVenetia Night  E Nydia Ytuarte 04/12/2020, 10:59 AM  [facility] [address] [place], [place], [address] Phone: [phone]   Fax:  [phone]  Name: Sabrina Castaneda MRN: [id number] Date of Birth: 1935-07-28

## 2020-04-14 ENCOUNTER — Encounter: Payer: Self-pay | Admitting: Physical Therapy

## 2020-04-14 ENCOUNTER — Ambulatory Visit: Payer: Medicare Other | Admitting: Physical Therapy

## 2020-04-14 ENCOUNTER — Other Ambulatory Visit: Payer: Self-pay

## 2020-04-14 DIAGNOSIS — M546 Pain in thoracic spine: Secondary | ICD-10-CM

## 2020-04-14 DIAGNOSIS — G8929 Other chronic pain: Secondary | ICD-10-CM

## 2020-04-14 DIAGNOSIS — M6281 Muscle weakness (generalized): Secondary | ICD-10-CM

## 2020-04-14 DIAGNOSIS — R293 Abnormal posture: Secondary | ICD-10-CM

## 2020-04-14 DIAGNOSIS — M25511 Pain in right shoulder: Secondary | ICD-10-CM | POA: Diagnosis not present

## 2020-04-14 NOTE — Therapy (Signed)
[facility] [address], [place], [place], [address] Phone: [phone]   Fax:  [phone]  Physical Therapy Treatment  Patient Details  Name: Sabrina Castaneda MRN: [id number] Date of Birth: 02-Oct-1935 Referring Provider (PT): Justice Britain, MD  Progress Note Reporting Period 02/18/20 to 04/14/20  See note below for Objective Data and Assessment of Progress/Goals.      Encounter Date: 04/14/2020   PT End of Session - 04/14/20 0931    Visit Number 10    Date for PT Re-Evaluation 05/12/20    Authorization Type [employer]HC Medicare    Progress Note Due on Visit 20    PT Start Time 0935    PT Stop Time 1013    PT Time Calculation (min) 38 min    Activity Tolerance Patient tolerated treatment well    Behavior During Therapy WFL for tasks assessed/performed           Past Medical History:  Diagnosis Date  . Allergy   . GERD (gastroesophageal reflux disease)   . History of chicken pox   . Hyperlipidemia   . Scoliosis   . Thyroid disease     Past Surgical History:  Procedure Laterality Date  . ABDOMINAL HYSTERECTOMY  1980  . ADENOIDECTOMY    . HERNIA REPAIR  02/04/1966  . REPLACEMENT TOTAL KNEE Right   . TONSILLECTOMY    . WRIST FRACTURE SURGERY Bilateral     There were no vitals filed for this visit.       [facility] PT Assessment - 04/14/20 0001      Assessment   Medical Diagnosis M25.511 (ICD-10-CM) - Pain in right shoulder    Referring Provider (PT) Justice Britain, MD    Onset Date/Surgical Date --   worsened about 1 year ago   Hand Dominance Right    Prior Therapy yes at this facility for thoracic pain      Observation/Other Assessments   Focus on Therapeutic Outcomes (FOTO)  70% from 62%      ROM / Strength   AROM / PROM / Strength AROM      AROM   AROM Assessment Site Shoulder    Right/Left Shoulder Right    Right Shoulder Extension 65 Degrees    Right Shoulder Flexion 155 Degrees    Right  Shoulder ABduction 170 Degrees    Right Shoulder Internal Rotation --   reaches mid-scapular region no pain     Strength   Strength Assessment Site Shoulder    Right/Left Shoulder Right;Left    Right Shoulder Flexion 4+/5    Right Shoulder Extension 4+/5    Right Shoulder ABduction 4/5    Right Shoulder Internal Rotation 4/5    Right Shoulder External Rotation 4/5                         [facility] Adult PT Treatment/Exercise - 04/14/20 0001      Shoulder Exercises: Supine   Horizontal ABduction Strengthening;Both;15 reps;Theraband    Theraband Level (Shoulder Horizontal ABduction) Level 1 (Yellow)    Horizontal ABduction Limitations 2x15    External Rotation Strengthening;15 reps;Theraband;Both    External Rotation Limitations 2x15    Flexion Strengthening;Right;Weights    Shoulder Flexion Weight (lbs) 1    Flexion Limitations ankle weight on wrist    Diagonals Strengthening;Both;Theraband;15 reps    Theraband Level (Shoulder Diagonals) Level 1 (Yellow)    Diagonals Limitations opp direction arms D1/D2  Other Supine Exercises chest press dowel 3lb weight x 15                    PT Short Term Goals - 04/14/20 0932      PT SHORT TERM GOAL #1   Title Pt will be ind with initial HEP    Status Achieved      PT SHORT TERM GOAL #2   Title pt will be educated on benefits of spinal decompression and osteoporosis dos and donts    Status Achieved      PT SHORT TERM GOAL #3   Title Pt will report reduced Lt thoracic pain with Rt UE ADLs by at least 20%    Baseline depends on the activity and day    Status Partially Met             PT Long Term Goals - 04/14/20 0932      PT LONG TERM GOAL #1   Title Pt will be ind with advanced HEP    Status On-going      PT LONG TERM GOAL #2   Title Pt will improve scapular and GH joint strength for functional reaching tasks with no or min strain on spine.    Baseline depends on the day    Status On-going       PT LONG TERM GOAL #3   Title Pt will report reduced thoracic and Rt shoulder pain with daily activities and sleep by at least 60%    Status Achieved      PT LONG TERM GOAL #4   Title Pt will be able to verbalize and demonstate postural corrections and change of postion with daily activity to reduce pain and improve alignment    Status Achieved                 Plan - 04/14/20 0947    Clinical Impression Statement Pt has greatly improved both Rt shoulder A/ROM and strength.  She reports 60% improvement in spinal pain/strain with both sleep and reaching activities using Rt dominant arm.  She has full painfree Rt shoulder abd and has improved Rt shoulder flexion from 120 deg to 155 deg.  Functional IR now reaches mid-scapular region (T/L jxn at eval with pain).  Rt shoulder strength is 4/5 - 4+/5 with some weakness in abd (4/5) and ER (4/5).  She performs HEP mostly in supine due to spinal pain with HEP in upright postures. FOTO score has improved from 62% to 70% demo'ing improved function of Rt shoulder  She was able to increased sets and reps today using yellow band for multiple muscle groups.  Pt performed short sets of A/ROM Rt UE flexion using 1lb ankle weight on wrist for first time today with some mild pain and fatigue reported with this.  Pt will continue to benefit from skilled PT to progress strength within newfound available ROM with d/c likely by end of month.    Examination-Activity Limitations Lift;Reach Overhead;Sleep;Dressing;Bed Mobility    PT Frequency 2x / week    PT Duration 12 weeks    PT Treatment/Interventions ADLs/Self Care Home Management;Cryotherapy;Moist Heat;Iontophoresis 70m/ml Dexamethasone;Electrical Stimulation;Functional mobility training;Therapeutic activities;Therapeutic exercise;Neuromuscular re-education;Patient/family education;Manual techniques;Passive range of motion;Joint Manipulations;Spinal Manipulations    PT Next Visit Plan stay supine for ther ex,  continue UBE 3x3 and working on 2x15 sets for more reps, f/u on aquatic ther ex    PT Home Exercise Plan Access Code: [email or web address]+ spinal decompression series  Consulted and Agree with Plan of Care Patient           Patient will benefit from skilled therapeutic intervention in order to improve the following deficits and impairments:     Visit Diagnosis: Chronic right shoulder pain  Pain in thoracic spine  Abnormal posture  Muscle weakness (generalized)     Problem List Patient Active Problem List   Diagnosis Date Noted  . Depression 03/14/2020  . Overweight (BMI 25.0-29.9) 03/14/2020  . Impingement syndrome of right shoulder region 03/01/2020  . Physical exam 09/10/2019  . Hyperlipidemia 08/16/2019  . Hypothyroid 08/16/2019  . Diplopia 03/09/2012  . Female stress incontinence 12/05/2010    Baruch Merl, PT 04/14/20 10:17 AM   [facility] [address], [place], [place], [address] Phone: [phone]   Fax:  [phone]  Name: CATE ORAVEC MRN: [id number] Date of Birth: 1935-05-22

## 2020-04-15 ENCOUNTER — Other Ambulatory Visit: Payer: Self-pay | Admitting: Family Medicine

## 2020-04-18 ENCOUNTER — Encounter: Payer: Self-pay | Admitting: Physical Therapy

## 2020-04-18 ENCOUNTER — Ambulatory Visit: Payer: Medicare Other | Admitting: Physical Therapy

## 2020-04-18 ENCOUNTER — Other Ambulatory Visit: Payer: Self-pay

## 2020-04-18 DIAGNOSIS — R293 Abnormal posture: Secondary | ICD-10-CM

## 2020-04-18 DIAGNOSIS — M546 Pain in thoracic spine: Secondary | ICD-10-CM

## 2020-04-18 DIAGNOSIS — M6281 Muscle weakness (generalized): Secondary | ICD-10-CM

## 2020-04-18 DIAGNOSIS — M25511 Pain in right shoulder: Secondary | ICD-10-CM | POA: Diagnosis not present

## 2020-04-18 DIAGNOSIS — G8929 Other chronic pain: Secondary | ICD-10-CM

## 2020-04-18 NOTE — Therapy (Signed)
[facility] [address] [place], [place], [address] Phone: [phone]   Fax:  [phone]  Physical Therapy Treatment  Patient Details  Name: Sabrina Castaneda MRN: [id number] Date of Birth: 05/29/1935 Referring Provider (PT): Justice Britain, MD   Encounter Date: 04/18/2020   PT End of Session - 04/18/20 1016    Visit Number 11    Date for PT Re-Evaluation 05/12/20    Authorization Type [employer] Medicare    Progress Note Due on Visit 20    PT Start Time 1016    PT Stop Time 1054    PT Time Calculation (min) 38 min    Activity Tolerance Patient tolerated treatment well    Behavior During Therapy [facility] for tasks assessed/performed           Past Medical History:  Diagnosis Date  . Allergy   . GERD (gastroesophageal reflux disease)   . History of chicken pox   . Hyperlipidemia   . Scoliosis   . Thyroid disease     Past Surgical History:  Procedure Laterality Date  . ABDOMINAL HYSTERECTOMY  1980  . ADENOIDECTOMY    . HERNIA REPAIR  02/04/1966  . REPLACEMENT TOTAL KNEE Right   . TONSILLECTOMY    . WRIST FRACTURE SURGERY Bilateral     There were no vitals filed for this visit.   Subjective Assessment - 04/18/20 1019    Subjective I did very well after last time even with the increased sets of exercises. I am going to the pool this afternoon to try the aquatic exercises.    Pertinent History 2021 Rt shoulder MRI + findings (see below), scoliosis with Rt convexity, Rt TKR, bil wrist fracture surgery from 2 separate falls, bone density test has been ordered, no recent Hx of falls    Diagnostic tests Rt shoulder MRI: RC and bicep tendinopathy, small full-thickness tear of anterior supraspinatus, AC joint arthritis, degenerative labrum    Patient Stated Goals build up shoulder strength    Currently in Pain? No/denies    Pain Relieving Factors exercises have been helpful                              [facility] Adult PT Treatment/Exercise - 04/18/20 0001      Exercises   Exercises Shoulder;Neck      Shoulder Exercises: Supine   Protraction Strengthening;Weights;Both;20 reps    Protraction Weight (lbs) 3    Protraction Limitations ankle weight on dowel    Horizontal ABduction Strengthening;Both;Theraband;15 reps    Theraband Level (Shoulder Horizontal ABduction) Level 1 (Yellow)    Horizontal ABduction Limitations 2x15    External Rotation Strengthening;Both;Theraband    Theraband Level (Shoulder External Rotation) Level 1 (Yellow)    External Rotation Limitations 2x15    Flexion Strengthening;10 reps    Shoulder Flexion Weight (lbs) 1.5    Flexion Limitations 2x5, ankle weight on wrist    Diagonals Strengthening;Both;15 reps;Theraband    Theraband Level (Shoulder Diagonals) Level 1 (Yellow)    Diagonals Limitations opp direction arms D1/D2    Other Supine Exercises chest press dowel 3lb weight 2x10      Shoulder Exercises: ROM/Strengthening   UBE (Upper Arm Bike) L2.0 3x3 PT present to monitor increased resistance level and discuss status                    PT Short Term Goals - 04/14/20 4503  PT SHORT TERM GOAL #1   Title Pt will be ind with initial HEP    Status Achieved      PT SHORT TERM GOAL #2   Title pt will be educated on benefits of spinal decompression and osteoporosis dos and donts    Status Achieved      PT SHORT TERM GOAL #3   Title Pt will report reduced Lt thoracic pain with Rt UE ADLs by at least 20%    Baseline depends on the activity and day    Status Partially Met             PT Long Term Goals - 04/14/20 0932      PT LONG TERM GOAL #1   Title Pt will be ind with advanced HEP    Status On-going      PT LONG TERM GOAL #2   Title Pt will improve scapular and GH joint strength for functional reaching tasks with no or min strain on spine.    Baseline depends on the day    Status On-going       PT LONG TERM GOAL #3   Title Pt will report reduced thoracic and Rt shoulder pain with daily activities and sleep by at least 60%    Status Achieved      PT LONG TERM GOAL #4   Title Pt will be able to verbalize and demonstate postural corrections and change of postion with daily activity to reduce pain and improve alignment    Status Achieved                 Plan - 04/18/20 1029    Clinical Impression Statement Pt continues to display improved strength and endurance of shoulder and scapular muscles with good tolerance of recent increase in reps and sets without pain.  She tolerated increased resistance on UBE to L2 today for same duration of 3x3' fwd/bwd.  She has gained improved painfree A/ROM in all planes and with functional use throughout daily tasks.  She continues to need to perform ther ex in supine secondary to spine condition/upper back pain in vertical postures with ther ex.  Pt had most challenge with supine weighted Rt shoulder flexion but was able to increase ankle weight on wrist to 1.5lb today and perform 2x5 with good joint mechanics and control.  She grows fatigued in shoulder muscles by end of session but without increased pain.  Continue along POC with anticipated d/c end of the month.    Comorbidities scoliosis    Rehab Potential Excellent    PT Frequency 2x / week    PT Duration 12 weeks    PT Treatment/Interventions ADLs/Self Care Home Management;Cryotherapy;Moist Heat;Iontophoresis 43m/ml Dexamethasone;Electrical Stimulation;Functional mobility training;Therapeutic activities;Therapeutic exercise;Neuromuscular re-education;Patient/family education;Manual techniques;Passive range of motion;Joint Manipulations;Spinal Manipulations    PT Next Visit Plan 2.0 UBE, continue working on shoulder flexion 1.5lb ankle weight on wrist and diagonals    PT Home Exercise Plan Access Code: [email or web address] spinal decompression series    Consulted and Agree with Plan of Care Patient            Patient will benefit from skilled therapeutic intervention in order to improve the following deficits and impairments:     Visit Diagnosis: Chronic right shoulder pain  Pain in thoracic spine  Abnormal posture  Muscle weakness (generalized)     Problem List Patient Active Problem List   Diagnosis Date Noted  . Depression 03/14/2020  . Overweight (BMI 25.0-29.9) 03/14/2020  .  Impingement syndrome of right shoulder region 03/01/2020  . Physical exam 09/10/2019  . Hyperlipidemia 08/16/2019  . Hypothyroid 08/16/2019  . Diplopia 03/09/2012  . Female stress incontinence 12/05/2010    Baruch Merl, PT 04/18/20 10:56 AM   [facility] [address] [place], [place], [address] Phone: [phone]   Fax:  [phone]  Name: Sabrina Castaneda MRN: [id number] Date of Birth: 05-29-1935

## 2020-04-21 ENCOUNTER — Encounter: Payer: Self-pay | Admitting: Physical Therapy

## 2020-04-21 ENCOUNTER — Ambulatory Visit: Payer: Medicare Other | Admitting: Physical Therapy

## 2020-04-21 ENCOUNTER — Other Ambulatory Visit: Payer: Self-pay

## 2020-04-21 DIAGNOSIS — M25511 Pain in right shoulder: Secondary | ICD-10-CM | POA: Diagnosis not present

## 2020-04-21 DIAGNOSIS — M6281 Muscle weakness (generalized): Secondary | ICD-10-CM

## 2020-04-21 DIAGNOSIS — M546 Pain in thoracic spine: Secondary | ICD-10-CM

## 2020-04-21 DIAGNOSIS — G8929 Other chronic pain: Secondary | ICD-10-CM

## 2020-04-21 DIAGNOSIS — R293 Abnormal posture: Secondary | ICD-10-CM

## 2020-04-21 NOTE — Therapy (Signed)
[facility] [address] [place], [place], [address] Phone: [phone]   Fax:  [phone]  Physical Therapy Treatment  Patient Details  Name: Sabrina Castaneda MRN: [id number] Date of Birth: 03/05/1935 Referring Provider (PT): Justice Britain, MD   Encounter Date: 04/21/2020   PT End of Session - 04/21/20 1026    Visit Number 12    Date for PT Re-Evaluation 05/12/20    Authorization Type [employer] Medicare    Progress Note Due on Visit 105    PT Start Time 1020    PT Stop Time 1045   Pt needed to leave early for appt   PT Time Calculation (min) 25 min    Activity Tolerance Patient tolerated treatment well    Behavior During Therapy [facility] for tasks assessed/performed           Past Medical History:  Diagnosis Date  . Allergy   . GERD (gastroesophageal reflux disease)   . History of chicken pox   . Hyperlipidemia   . Scoliosis   . Thyroid disease     Past Surgical History:  Procedure Laterality Date  . ABDOMINAL HYSTERECTOMY  1980  . ADENOIDECTOMY    . HERNIA REPAIR  02/04/1966  . REPLACEMENT TOTAL KNEE Right   . TONSILLECTOMY    . WRIST FRACTURE SURGERY Bilateral     There were no vitals filed for this visit.   Subjective Assessment - 04/21/20 1023    Subjective I am doing well.  HEP going well.  I have to leave early today for a housecleaning appt that got rescheduled.    Pertinent History 2021 Rt shoulder MRI + findings (see below), scoliosis with Rt convexity, Rt TKR, bil wrist fracture surgery from 2 separate falls, bone density test has been ordered, no recent Hx of falls    Limitations House hold activities;Lifting;Other (comment)    Diagnostic tests Rt shoulder MRI: RC and bicep tendinopathy, small full-thickness tear of anterior supraspinatus, AC joint arthritis, degenerative labrum    Patient Stated Goals build up shoulder strength                             [facility] Adult PT  Treatment/Exercise - 04/21/20 0001      Exercises   Exercises Shoulder      Shoulder Exercises: Supine   Horizontal ABduction Strengthening;20 reps;Theraband;Both    Theraband Level (Shoulder Horizontal ABduction) Level 1 (Yellow)    Horizontal ABduction Limitations 1x20    External Rotation Strengthening;Both;Theraband    Theraband Level (Shoulder External Rotation) Level 1 (Yellow)    External Rotation Limitations 1x20    Flexion Strengthening;Both;20 reps    Shoulder Flexion Weight (lbs) 2.5    Flexion Limitations dowel with ankle weight    Diagonals Strengthening;Both    Theraband Level (Shoulder Diagonals) Level 1 (Yellow)    Diagonals Limitations draw the sword    Other Supine Exercises chest press dowel 2.5lb weight x20 reps      Shoulder Exercises: ROM/Strengthening   UBE (Upper Arm Bike) L2 2x2 end of session, PT present to monitor                    PT Short Term Goals - 04/14/20 0932      PT SHORT TERM GOAL #1   Title Pt will be ind with initial HEP    Status Achieved      PT SHORT TERM GOAL #  2   Title pt will be educated on benefits of spinal decompression and osteoporosis dos and donts    Status Achieved      PT SHORT TERM GOAL #3   Title Pt will report reduced Lt thoracic pain with Rt UE ADLs by at least 20%    Baseline depends on the activity and day    Status Partially Met             PT Long Term Goals - 04/14/20 0932      PT LONG TERM GOAL #1   Title Pt will be ind with advanced HEP    Status On-going      PT LONG TERM GOAL #2   Title Pt will improve scapular and GH joint strength for functional reaching tasks with no or min strain on spine.    Baseline depends on the day    Status On-going      PT LONG TERM GOAL #3   Title Pt will report reduced thoracic and Rt shoulder pain with daily activities and sleep by at least 60%    Status Achieved      PT LONG TERM GOAL #4   Title Pt will be able to verbalize and demonstate postural  corrections and change of postion with daily activity to reduce pain and improve alignment    Status Achieved                 Plan - 04/21/20 1103    Clinical Impression Statement Short session today due to Pt having unexpected appt rescheduled so needed to leave early.  Pt continues to do well and reports she isn't having trouble with reaching overhead for daily tasks.  She continues to be compliant with HEP and demo'd 20 rep sets today with good form and improving endurance.  No pain within session today.  Pt is nearing readiness for d/c.  She is hesitant to progress to red tband secondary to Lt intrascpaular/spine pain she used to get with shoulder ther ex.  Current program is well tolerated and Pt is pleased with her progress.  She continues to maintain much improved A/ROM of Rt shoulder.  Continue finalizing HEP and progressing endurance of Rt shoulder girdle.    Comorbidities scoliosis    PT Frequency 2x / week    PT Duration 12 weeks    PT Treatment/Interventions ADLs/Self Care Home Management;Cryotherapy;Moist Heat;Iontophoresis 42m/ml Dexamethasone;Electrical Stimulation;Functional mobility training;Therapeutic activities;Therapeutic exercise;Neuromuscular re-education;Patient/family education;Manual techniques;Passive range of motion;Joint Manipulations;Spinal Manipulations    PT Next Visit Plan 2.0 UBE, continue working on 15-20 reps sets    PT Home Exercise Plan Access Code: [email or web address]+ spinal decompression series    Consulted and Agree with Plan of Care Patient           Patient will benefit from skilled therapeutic intervention in order to improve the following deficits and impairments:     Visit Diagnosis: Chronic right shoulder pain  Pain in thoracic spine  Abnormal posture  Muscle weakness (generalized)     Problem List Patient Active Problem List   Diagnosis Date Noted  . Depression 03/14/2020  . Overweight (BMI 25.0-29.9) 03/14/2020  . Impingement  syndrome of right shoulder region 03/01/2020  . Physical exam 09/10/2019  . Hyperlipidemia 08/16/2019  . Hypothyroid 08/16/2019  . Diplopia 03/09/2012  . Female stress incontinence 12/05/2010    JBaruch Merl PT 04/21/20 11:08 AM   [facility] [address] [place] [address]  Phone: [phone]   Fax:  [phone]  Name: Sabrina Castaneda MRN: [id number] Date of Birth: 22-May-1935

## 2020-04-25 ENCOUNTER — Encounter: Payer: Self-pay | Admitting: Family Medicine

## 2020-04-25 ENCOUNTER — Ambulatory Visit: Payer: Medicare Other | Admitting: Family Medicine

## 2020-04-25 ENCOUNTER — Ambulatory Visit: Payer: Medicare Other | Admitting: Physical Therapy

## 2020-04-25 ENCOUNTER — Other Ambulatory Visit: Payer: Self-pay

## 2020-04-25 ENCOUNTER — Encounter: Payer: Self-pay | Admitting: Physical Therapy

## 2020-04-25 VITALS — BP 110/70 | HR 94 | Temp 97.5°F | Resp 20 | Ht 63.0 in | Wt 149.6 lb

## 2020-04-25 DIAGNOSIS — M6281 Muscle weakness (generalized): Secondary | ICD-10-CM

## 2020-04-25 DIAGNOSIS — G8929 Other chronic pain: Secondary | ICD-10-CM

## 2020-04-25 DIAGNOSIS — R293 Abnormal posture: Secondary | ICD-10-CM

## 2020-04-25 DIAGNOSIS — F32A Depression, unspecified: Secondary | ICD-10-CM | POA: Diagnosis not present

## 2020-04-25 DIAGNOSIS — M25511 Pain in right shoulder: Secondary | ICD-10-CM | POA: Diagnosis not present

## 2020-04-25 DIAGNOSIS — M546 Pain in thoracic spine: Secondary | ICD-10-CM

## 2020-04-25 MED ORDER — OMEPRAZOLE 40 MG PO CPDR
DELAYED_RELEASE_CAPSULE | ORAL | 1 refills | Status: DC
Start: 2020-04-25 — End: 2020-12-20

## 2020-04-25 NOTE — Progress Notes (Signed)
   Subjective:    Patient ID: Sabrina Castaneda, female    DOB: 17-Jan-1936, 85 y.o.   MRN: [id number]  HPI Depression- pt was started on Sertraline 25mg  daily at last visit.  Pt reports she is doing well.  Medication has helped.  She is 'not dwelling on the small things'.  Has been able to turn the news off.  Pt has been going to PT regularly- this has allowed her to walk her dog more regularly b/c she feels more steady on her feet.  This is also a good physical and social outlet.  Has noticed some increased constipation since starting medication but she is trying to combat this w/ more veggies and more water.  Pt has not talked to her daughter to see whether she has noticed a change   Review of Systems For ROS see HPI   This visit occurred during the SARS-CoV-2 public health emergency.  Safety protocols were in place, including screening questions prior to the visit, additional usage of staff PPE, and extensive cleaning of exam room while observing appropriate contact time as indicated for disinfecting solutions.       Objective:   Physical Exam Vitals reviewed.  Constitutional:      General: She is not in acute distress.    Appearance: Normal appearance. She is not ill-appearing.  HENT:     Head: Normocephalic and atraumatic.  Eyes:     Extraocular Movements: Extraocular movements intact.     Conjunctiva/sclera: Conjunctivae normal.     Pupils: Pupils are equal, round, and reactive to light.  Neurological:     General: No focal deficit present.     Mental Status: She is alert and oriented to person, place, and time.  Psychiatric:        Mood and Affect: Mood normal.        Behavior: Behavior normal.        Thought Content: Thought content normal.           Assessment & Plan:

## 2020-04-25 NOTE — Therapy (Signed)
[facility] [address] [place], [place], [address] Phone: [phone]   Fax:  [phone]  Physical Therapy Treatment  Patient Details  Name: Sabrina Castaneda MRN: [id number] Date of Birth: [birth year] Referring Provider (PT): Justice Britain, MD   Encounter Date: 04/25/2020   PT End of Session - 04/25/20 1014    Visit Number 13    Date for PT Re-Evaluation 05/12/20    Authorization Type [employer] Medicare    Progress Note Due on Visit 20    PT Start Time 1014    PT Stop Time 1052    PT Time Calculation (min) 38 min    Activity Tolerance Patient tolerated treatment well    Behavior During Therapy [facility] for tasks assessed/performed           Past Medical History:  Diagnosis Date  . Allergy   . GERD (gastroesophageal reflux disease)   . History of chicken pox   . Hyperlipidemia   . Scoliosis   . Thyroid disease     Past Surgical History:  Procedure Laterality Date  . ABDOMINAL HYSTERECTOMY  1980  . ADENOIDECTOMY    . HERNIA REPAIR  02/04/1966  . REPLACEMENT TOTAL KNEE Right   . TONSILLECTOMY    . WRIST FRACTURE SURGERY Bilateral     There were no vitals filed for this visit.   Subjective Assessment - 04/25/20 1019    Subjective Pain comes and goes but I am much better than before I started PT.  No pain with reaching for things.    Pertinent History 2021 Rt shoulder MRI + findings (see below), scoliosis with Rt convexity, Rt TKR, bil wrist fracture surgery from 2 separate falls, bone density test has been ordered, no recent Hx of falls    Limitations House hold activities;Lifting;Other (comment)    Diagnostic tests Rt shoulder MRI: RC and bicep tendinopathy, small full-thickness tear of anterior supraspinatus, AC joint arthritis, degenerative labrum    Patient Stated Goals build up shoulder strength    Currently in Pain? No/denies                             [facility] Adult PT Treatment/Exercise  - 04/25/20 0001      Exercises   Exercises Shoulder      Neck Exercises: Machines for Strengthening   UBE (Upper Arm Bike) L2 3x3 PT present to discuss pain pattern and function      Shoulder Exercises: Supine   Protraction Strengthening;Weights;Both;10 reps    Protraction Weight (lbs) 3    Protraction Limitations dowel +3lb ankle weight, combined with chest press    Horizontal ABduction Strengthening;20 reps;Theraband;Both    Theraband Level (Shoulder Horizontal ABduction) Level 1 (Yellow)    Horizontal ABduction Limitations 1x20    External Rotation Strengthening;Both;Theraband    Theraband Level (Shoulder External Rotation) Level 1 (Yellow)    External Rotation Limitations 1x20    Flexion Strengthening;Both;20 reps    Shoulder Flexion Weight (lbs) 1.5    Flexion Limitations 1x10, slight elbow flexion to shorted lever, dowel with ankle weight, shakey on eccentric lowering with fatigue last reps    Diagonals Strengthening;Both;20 reps;Theraband    Theraband Level (Shoulder Diagonals) Level 1 (Yellow);Level 2 (Red)    Diagonals Limitations pull equal and opp ways 2x5 red, then 2x5 yellow    Other Supine Exercises chest press dowel +3lb ankle weight on dowel x 10  Shoulder Exercises: Standing   Protraction --    Theraband Level (Shoulder Protraction) --    Horizontal ABduction Strengthening;Both;20 reps;Theraband    Theraband Level (Shoulder Horizontal ABduction) Level 1 (Yellow);Level 2 (Red)    Horizontal ABduction Limitations 1x10 red, 1x10 yellow    External Rotation Strengthening;20 reps;Theraband    Theraband Level (Shoulder External Rotation) Level 2 (Red);Level 1 (Yellow)    External Rotation Limitations 1x10 red, 1x10 yellow      Shoulder Exercises: ROM/Strengthening   Proximal Shoulder Strengthening, Supine small arcs of motion 12:00 to 6:00 80 deg to 110 deg, 9:00 to 3:00 at 90 deg approx 12 inch motion x 10, Rt, 2lb    Other ROM/Strengthening Exercises red band  supine shoulder ext 90 deg to 0 deg PT anchoring band, 1x10 red band                    PT Short Term Goals - 04/14/20 0932      PT SHORT TERM GOAL #1   Title Pt will be ind with initial HEP    Status Achieved      PT SHORT TERM GOAL #2   Title pt will be educated on benefits of spinal decompression and osteoporosis dos and donts    Status Achieved      PT SHORT TERM GOAL #3   Title Pt will report reduced Lt thoracic pain with Rt UE ADLs by at least 20%    Baseline depends on the activity and day    Status Partially Met             PT Long Term Goals - 04/14/20 0932      PT LONG TERM GOAL #1   Title Pt will be ind with advanced HEP    Status On-going      PT LONG TERM GOAL #2   Title Pt will improve scapular and GH joint strength for functional reaching tasks with no or min strain on spine.    Baseline depends on the day    Status On-going      PT LONG TERM GOAL #3   Title Pt will report reduced thoracic and Rt shoulder pain with daily activities and sleep by at least 60%    Status Achieved      PT LONG TERM GOAL #4   Title Pt will be able to verbalize and demonstate postural corrections and change of postion with daily activity to reduce pain and improve alignment    Status Achieved                 Plan - 04/25/20 1030    Clinical Impression Statement Pt reports improved painfree ROM allowing for painfree reaching throughout the day.  Rt shoulder pain is intermittent and brief when experienced.  Pt was willing to trial standing rows and progression to red band from yellow for first set of most supine ther ex today.  Ther ex was well tolerated without increase in pain. Pt noted she was more aware of muscle tightness having progressed to higher resistance.  She grows fatigued with shoulder flexion weighted ther ex and reports slight pain with this.  She will attend one more session to re-evaluate with PT anticipating readiness for d/c to HEP.  PT gave red  band for HEP progression today.    Comorbidities scoliosis    Examination-Activity Limitations Lift;Sleep;Dressing;Bed Mobility    Rehab Potential Excellent    PT Frequency 2x / week    PT  Duration 12 weeks    PT Treatment/Interventions ADLs/Self Care Home Management;Cryotherapy;Moist Heat;Iontophoresis 83m/ml Dexamethasone;Electrical Stimulation;Functional mobility training;Therapeutic activities;Therapeutic exercise;Neuromuscular re-education;Patient/family education;Manual techniques;Passive range of motion;Joint Manipulations;Spinal Manipulations    PT Next Visit Plan UBE 2.0, finalize HEP, small arcs of motion from 90 deg with 2lb weight for lifting and stabilizing    PT Home Exercise Plan Access Code: [email or web address]+ spinal decompression series    Consulted and Agree with Plan of Care Patient           Patient will benefit from skilled therapeutic intervention in order to improve the following deficits and impairments:     Visit Diagnosis: Chronic right shoulder pain  Pain in thoracic spine  Abnormal posture  Muscle weakness (generalized)     Problem List Patient Active Problem List   Diagnosis Date Noted  . Depression 03/14/2020  . Overweight (BMI 25.0-29.9) 03/14/2020  . Impingement syndrome of right shoulder region 03/01/2020  . Physical exam 09/10/2019  . Hyperlipidemia 08/16/2019  . Hypothyroid 08/16/2019  . Diplopia 03/09/2012  . Female stress incontinence 12/05/2010    JBaruch Merl PT 04/25/20 10:53 AM   [facility] [address] [place] [address]Phone: [phone]  Fax:  [phone] Name: HIDELLA LAMONTAGNEMRN: [id number] of Birth: [birth year]

## 2020-04-25 NOTE — Patient Instructions (Signed)
Schedule your complete physical for August No med changes at this time- you're doing great!! Call with any questions or concerns Stay Safe!  Stay Healthy! Happy Spring!!!

## 2020-04-25 NOTE — Assessment & Plan Note (Signed)
Much improved since starting Sertraline 25mg  daily.  She feels this is the right dose and not interested in increasing at this time.  Will continue to follow.

## 2020-04-28 ENCOUNTER — Other Ambulatory Visit: Payer: Self-pay

## 2020-04-28 ENCOUNTER — Ambulatory Visit: Payer: Medicare Other | Admitting: Physical Therapy

## 2020-04-28 ENCOUNTER — Encounter: Payer: Self-pay | Admitting: Physical Therapy

## 2020-04-28 DIAGNOSIS — M6281 Muscle weakness (generalized): Secondary | ICD-10-CM

## 2020-04-28 DIAGNOSIS — G8929 Other chronic pain: Secondary | ICD-10-CM

## 2020-04-28 DIAGNOSIS — R293 Abnormal posture: Secondary | ICD-10-CM

## 2020-04-28 DIAGNOSIS — M546 Pain in thoracic spine: Secondary | ICD-10-CM

## 2020-04-28 DIAGNOSIS — M25511 Pain in right shoulder: Secondary | ICD-10-CM | POA: Diagnosis not present

## 2020-04-28 NOTE — Therapy (Signed)
[facility] [address], [place], [place], [address] Phone: [phone]   Fax:  [phone]  Physical Therapy Treatment  Patient Details  Name: Sabrina Castaneda MRN: [id number] Date of Birth: 1935-05-20 Referring Provider (PT): Justice Britain, MD   Encounter Date: 04/28/2020   PT End of Session - 04/28/20 1022    Visit Number 14    Date for PT Re-Evaluation 05/12/20    Authorization Type UHC Medicare    Progress Note Due on Visit 5    PT Start Time 1017    PT Stop Time 1038   short session Pt needed to leave early for appt   PT Time Calculation (min) 21 min    Activity Tolerance Patient tolerated treatment well    Behavior During Therapy [facility] for tasks assessed/performed           Past Medical History:  Diagnosis Date  . Allergy   . GERD (gastroesophageal reflux disease)   . History of chicken pox   . Hyperlipidemia   . Scoliosis   . Thyroid disease     Past Surgical History:  Procedure Laterality Date  . ABDOMINAL HYSTERECTOMY  1980  . ADENOIDECTOMY    . HERNIA REPAIR  02/04/1966  . REPLACEMENT TOTAL KNEE Right   . TONSILLECTOMY    . WRIST FRACTURE SURGERY Bilateral     There were no vitals filed for this visit.   Subjective Assessment - 04/28/20 1020    Subjective I was a bit sore having increased the resistance last time.  I have also been having unrelated increased upper back pain.  I am ready to d/c today.  I'm using my arm with much less pain.    Pertinent History 2021 Rt shoulder MRI + findings (see below), scoliosis with Rt convexity, Rt TKR, bil wrist fracture surgery from 2 separate falls, bone density test has been ordered, no recent Hx of falls    Limitations House hold activities;Lifting;Other (comment)    Diagnostic tests Rt shoulder MRI: RC and bicep tendinopathy, small full-thickness tear of anterior supraspinatus, AC joint arthritis, degenerative labrum    Patient Stated Goals build up  shoulder strength    Currently in Pain? No/denies    Pain Onset More than a month ago    Pain Frequency Rarely    Aggravating Factors  occassional sharp twinges but rarely    Pain Relieving Factors HEP              [facility] PT Assessment - 04/28/20 0001      Assessment   Medical Diagnosis M25.511 (ICD-10-CM) - Pain in right shoulder    Referring Provider (PT) Justice Britain, MD    Onset Date/Surgical Date --   worsened about 1 year ago   Hand Dominance Right    Prior Therapy yes at this facility for thoracic pain      Observation/Other Assessments   Focus on Therapeutic Outcomes (FOTO)  70% from 62%      ROM / Strength   AROM / PROM / Strength AROM;Strength      AROM   Right Shoulder Extension 65 Degrees    Right Shoulder Flexion 165 Degrees    Right Shoulder ABduction 170 Degrees    Right Shoulder Internal Rotation --   reaches mid-scapular region no pain   Right Shoulder External Rotation --   full painfree functional ER     Strength   Right Shoulder Flexion 4+/5    Right Shoulder Extension  4+/5    Right Shoulder ABduction 4/5    Right Shoulder Internal Rotation 4+/5    Right Shoulder External Rotation 4+/5                         [facility] Adult PT Treatment/Exercise - 04/28/20 0001      Self-Care   Self-Care Other Self-Care Comments    Other Self-Care Comments  verbal review of HEP, discussion of pacing with aquatic ther ex when she initiates that, benefits of water walking      Shoulder Exercises: ROM/Strengthening   UBE (Upper Arm Bike) L1.5 2x2, decreased time and resistance due to increased back pain today      Manual Therapy   Manual Therapy Soft tissue mobilization    Soft tissue mobilization Lt QL and thoracic and lumbar paraspinal elongation                    PT Short Term Goals - 04/14/20 0932      PT SHORT TERM GOAL #1   Title Pt will be ind with initial HEP    Status Achieved      PT SHORT TERM GOAL #2   Title pt will be  educated on benefits of spinal decompression and osteoporosis dos and donts    Status Achieved      PT SHORT TERM GOAL #3   Title Pt will report reduced Lt thoracic pain with Rt UE ADLs by at least 20%    Baseline depends on the activity and day    Status Partially Met             PT Long Term Goals - 04/28/20 1022      PT LONG TERM GOAL #1   Title Pt will be ind with advanced HEP    Status Achieved      PT LONG TERM GOAL #2   Title Pt will improve scapular and GH joint strength for functional reaching tasks with no or min strain on spine.    Status Achieved      PT LONG TERM GOAL #3   Title Pt will report reduced thoracic and Rt shoulder pain with daily activities and sleep by at least 60%    Baseline sleep much improved, some occasional upper back pain with activities    Status Achieved      PT LONG TERM GOAL #4   Title Pt will be able to verbalize and demonstate postural corrections and change of postion with daily activity to reduce pain and improve alignment    Status Achieved                 Plan - 04/28/20 1041    Clinical Impression Statement Pt has met all goals.  She demos full Rt shoulder ROM without pain today.  Strength is 4+/5 in Rt shoulder.  FOTO score improved to 70% from 62% last visit, meeting goal and demo'ing improved function of Rt shoulder.  She is compliant with HEP which was verbally reviewed today, in addition to discussion of pacing and water walking benefits in pool when she initiates aquatic exercise next week at her living facility.  Pt arrived with increased back pain related to scoliosis which can be made worse when using UEs.  PT performed STM for elongation along Lt trunk focusing on Lt QL and thoracolumbar paraspinals.  Pt is ready for d/c to HEP.  Short visit today due to Pt needing to leave  early.    Comorbidities scoliosis    PT Frequency 2x / week    PT Duration 12 weeks    PT Treatment/Interventions ADLs/Self Care Home  Management;Cryotherapy;Moist Heat;Iontophoresis 86m/ml Dexamethasone;Electrical Stimulation;Functional mobility training;Therapeutic activities;Therapeutic exercise;Neuromuscular re-education;Patient/family education;Manual techniques;Passive range of motion;Joint Manipulations;Spinal Manipulations    PT Next Visit Plan d/c to HEP    PT Home Exercise Plan Access Code: [email or web address]+ spinal decompression series    Consulted and Agree with Plan of Care Patient           Patient will benefit from skilled therapeutic intervention in order to improve the following deficits and impairments:     Visit Diagnosis: Chronic right shoulder pain  Pain in thoracic spine  Abnormal posture  Muscle weakness (generalized)     Problem List Patient Active Problem List   Diagnosis Date Noted  . Depression 03/14/2020  . Overweight (BMI 25.0-29.9) 03/14/2020  . Impingement syndrome of right shoulder region 03/01/2020  . Physical exam 09/10/2019  . Hyperlipidemia 08/16/2019  . Hypothyroid 08/16/2019  . Diplopia 03/09/2012  . Female stress incontinence 12/05/2010    PHYSICAL THERAPY DISCHARGE SUMMARY  Visits from Start of Care: 14  Current functional level related to goals / functional outcomes: See above   Remaining deficits: See above   Education / Equipment: HEP Plan: Patient agrees to discharge.  Patient goals were met. Patient is being discharged due to meeting the stated rehab goals.  ?????         JBaruch Merl PT 04/28/20 10:45 AM   [facility] [address] [place] [address]Phone: [phone]  Fax:  [phone] Name: Sabrina HANNISMRN: [id number] of Birth: 9Nov 12, 1937

## 2020-05-01 ENCOUNTER — Encounter: Payer: Medicare Other | Admitting: Physical Therapy

## 2020-05-05 ENCOUNTER — Encounter: Payer: Medicare Other | Admitting: Physical Therapy

## 2020-05-09 ENCOUNTER — Encounter: Payer: Medicare Other | Admitting: Physical Therapy

## 2020-05-12 ENCOUNTER — Encounter: Payer: Medicare Other | Admitting: Physical Therapy

## 2020-06-01 ENCOUNTER — Ambulatory Visit: Payer: Medicare Other | Admitting: Allergy

## 2020-07-02 ENCOUNTER — Other Ambulatory Visit: Payer: Self-pay | Admitting: Family Medicine

## 2020-07-04 NOTE — Telephone Encounter (Signed)
Patient is requesting a refill of the following medications: Requested Prescriptions   Pending Prescriptions Disp Refills   sertraline (ZOLOFT) 25 MG tablet [Pharmacy Med Name: SERTRALINE HCL 25 MG TABLET] 90 tablet 0    Sig: TAKE 1 TABLET (25 MG TOTAL) BY MOUTH DAILY.    Date of patient request: 07/02/2020 Last office visit: 04/25/20 Date of last refill: 04/05/20 Last refill amount: 90 Follow up time period per chart: August

## 2020-07-21 ENCOUNTER — Other Ambulatory Visit: Payer: Self-pay | Admitting: Family Medicine

## 2020-07-25 ENCOUNTER — Encounter: Payer: Self-pay | Admitting: Family Medicine

## 2020-07-25 ENCOUNTER — Other Ambulatory Visit: Payer: Self-pay

## 2020-07-25 ENCOUNTER — Ambulatory Visit: Payer: Medicare Other | Admitting: Family Medicine

## 2020-07-25 VITALS — BP 125/80 | HR 85 | Temp 98.1°F | Resp 17 | Ht 63.0 in | Wt 143.6 lb

## 2020-07-25 DIAGNOSIS — R350 Frequency of micturition: Secondary | ICD-10-CM

## 2020-07-25 LAB — POCT URINALYSIS DIPSTICK
Bilirubin, UA: NEGATIVE
Blood, UA: POSITIVE
Glucose, UA: NEGATIVE
Ketones, UA: NEGATIVE
Nitrite, UA: NEGATIVE
Protein, UA: NEGATIVE
Spec Grav, UA: 1.01 (ref 1.010–1.025)
Urobilinogen, UA: 0.2 E.U./dL
pH, UA: 6.5 (ref 5.0–8.0)

## 2020-07-25 MED ORDER — CEPHALEXIN 500 MG PO CAPS: 500.0000 mg | ORAL_CAPSULE | Freq: Two times a day (BID) | ORAL | 0 refills | Status: AC

## 2020-07-25 NOTE — Progress Notes (Signed)
   Subjective:    Patient ID: Sabrina Castaneda, female    DOB: 1935/11/09, 85 y.o.   MRN: [id number]  HPI Urinary frequency- pt has hx of similar and can tell when she needs to seek care.  Increased frequency, burning w/ urination.  No visible blood.  + urgency, hesitancy.  Pt reports not feeling well yesterday but knew this morning upon waking.  No fevers.  No back pain.  No suprapubic pain.   Review of Systems For ROS see HPI   This visit occurred during the SARS-CoV-2 public health emergency.  Safety protocols were in place, including screening questions prior to the visit, additional usage of staff PPE, and extensive cleaning of exam room while observing appropriate contact time as indicated for disinfecting solutions.      Objective:   Physical Exam Vitals reviewed.  Constitutional:      General: She is not in acute distress.    Appearance: Normal appearance. She is well-developed.  Abdominal:     General: There is no distension.     Palpations: Abdomen is soft.     Tenderness: There is no abdominal tenderness (no suprapubic or CVA tenderness).  Neurological:     General: No focal deficit present.     Mental Status: She is alert and oriented to person, place, and time.  Psychiatric:        Mood and Affect: Mood normal.        Behavior: Behavior normal.        Thought Content: Thought content normal.          Assessment & Plan:   Urinary frequency- pt's sxs and UA are highly suspicious for infxn.  Start abx.  Reviewed supportive care and red flags that should prompt return. Pt expressed understanding and is in agreement w/ plan.

## 2020-07-25 NOTE — Patient Instructions (Signed)
Follow up as needed or as scheduled Continue to drink plenty of water to flush the kidneys START the Cephalexin twice daily- take w/ food Call with any questions or concerns Have a great summer!!!

## 2020-07-25 NOTE — Progress Notes (Signed)
slm 

## 2020-07-25 NOTE — Addendum Note (Signed)
Addended by: Ilda Foil on: 07/25/2020 12:38 PM   Modules accepted: Orders

## 2020-07-27 LAB — URINE CULTURE
MICRO NUMBER:: 12034434
SPECIMEN QUALITY:: ADEQUATE

## 2020-08-02 ENCOUNTER — Encounter: Payer: Self-pay | Admitting: *Deleted

## 2020-08-21 ENCOUNTER — Encounter: Payer: Self-pay | Admitting: *Deleted

## 2020-08-21 ENCOUNTER — Ambulatory Visit (INDEPENDENT_AMBULATORY_CARE_PROVIDER_SITE_OTHER): Payer: Medicare Other | Admitting: *Deleted

## 2020-08-21 DIAGNOSIS — Z0000 Encounter for general adult medical examination without abnormal findings: Secondary | ICD-10-CM

## 2020-08-21 NOTE — Progress Notes (Signed)
Subjective:   Sabrina Castaneda is a 85 y.o. female who presents for Medicare Annual (Subsequent) preventive examination.  I connected with  Sabrina Castaneda on 08/21/20 by a telephone enabled telemedicine application and verified that I am speaking with the correct person using two identifiers.   I discussed the limitations of evaluation and management by telemedicine. The patient expressed understanding and agreed to proceed.   Review of Systems    NA       Objective:    Today's Vitals   There is no height or weight on file to calculate BMI.  Advanced Directives 08/21/2020 02/18/2020 10/04/2019 08/03/2019  Does Patient Have a Medical Advance Directive? Yes Yes Yes Yes  Type of Advance Directive Living will;Healthcare Power of [employer] Power of [place];Living will Healthcare Power of [place];Living will Healthcare Power of [place];Living will  Does patient want to make changes to medical advance directive? - No - Patient declined No - Patient declined -  Copy of Healthcare Power of Attorney in Chart? No - copy requested No - copy requested No - copy requested No - copy requested    Current Medications (verified) Outpatient Encounter Medications as of 08/21/2020  Medication Sig   aspirin 81 MG EC tablet Take by mouth.   atorvastatin (LIPITOR) 40 MG tablet Take 40 mg by mouth daily.   Cholecalciferol 50 MCG (2000 UT) CAPS TAKE 2 CAPSULES BY MOUTH EVERY DAY   diclofenac Sodium (VOLTAREN) 1 % GEL Apply 4 g topically 4 (four) times daily as needed.   levothyroxine (SYNTHROID) 75 MCG tablet TAKE 1 TABLET BY MOUTH EVERY DAY   loratadine (CLARITIN) 10 MG tablet Take by mouth.   omeprazole (PRILOSEC) 40 MG capsule TAKE 1 CAPSULE BY MOUTH EVERY DAY   oxybutynin (DITROPAN-XL) 10 MG 24 hr tablet TAKE 1 TABLET BY MOUTH EVERY DAY   sertraline (ZOLOFT) 25 MG tablet TAKE 1 TABLET (25 MG TOTAL) BY MOUTH DAILY.   No facility-administered encounter medications on file as of 08/21/2020.     Allergies (verified) Patient has no known allergies.   History: Past Medical History:  Diagnosis Date   Allergy    GERD (gastroesophageal reflux disease)    History of chicken pox    Hyperlipidemia    Scoliosis    Thyroid disease    Past Surgical History:  Procedure Laterality Date   ABDOMINAL HYSTERECTOMY  1980   ADENOIDECTOMY     HERNIA REPAIR  02/04/1966   REPLACEMENT TOTAL KNEE Right    TONSILLECTOMY     WRIST FRACTURE SURGERY Bilateral    Family History  Problem Relation Age of Onset   Allergic rhinitis Neg Hx    Angioedema Neg Hx    Asthma Neg Hx    Atopy Neg Hx    Eczema Neg Hx    Immunodeficiency Neg Hx    Urticaria Neg Hx    Social History   Socioeconomic History   Marital status: Widowed    Spouse name: Not on file   Number of children: Not on file   Years of education: Not on file   Highest education level: Not on file  Occupational History   Not on file  Tobacco Use   Smoking status: Never   Smokeless tobacco: Never  Vaping Use   Vaping Use: Never used  Substance and Sexual Activity   Alcohol use: Yes    Comment: occasionally   Drug use: Never   Sexual activity: Not Currently  Other Topics Concern  Not on file  Social History Narrative   Not on file   Social Determinants of Health   Financial Resource Strain: Low Risk    Difficulty of Paying Living Expenses: Not hard at all  Food Insecurity: No Food Insecurity   Worried About Programme researcher, broadcasting/film/videounning Out of Food in the Last Year: Never true   Baristaan Out of Food in the Last Year: Never true  Transportation Needs: No Transportation Needs   Lack of Transportation (Medical): No   Lack of Transportation (Non-Medical): No  Physical Activity: Inactive   Days of Exercise per Week: 0 days   Minutes of Exercise per Session: 0 min  Stress: No Stress Concern Present   Feeling of Stress : Not at all  Social Connections: Socially Isolated   Frequency of Communication with Friends and Family: More than three  times a week   Frequency of Social Gatherings with Friends and Family: More than three times a week   Attends Religious Services: Never   Database administratorActive Member of Clubs or Organizations: No   Attends BankerClub or Organization Meetings: Never   Marital Status: Widowed    Tobacco Counseling Counseling given: Not Answered   Clinical Intake:  Pre-visit preparation completed: Yes  Pain : No/denies pain     Nutritional Risks: None Diabetes: No  How often do you need to have someone help you when you read instructions, pamphlets, or other written materials from your doctor or pharmacy?: 1 - Never  Diabetic?  NO  Interpreter Needed?: No  Information entered by :: Remi HaggardJulie Santita Hunsberger LPN   Activities of Daily Living In your present state of health, do you have any difficulty performing the following activities: 07/25/2020 04/25/2020  Hearing? N N  Vision? Y N  Difficulty concentrating or making decisions? N N  Walking or climbing stairs? N N  Dressing or bathing? N N  Doing errands, shopping? N N  Some recent data might be hidden    Patient Care Team: Sheliah Hatchabori, Katherine E, MD as PCP - General (Family Medicine)  Indicate any recent Medical Services you may have received from other than [facility] providers in the past year (date may be approximate).     Assessment:   This is a routine wellness examination for [name].  Hearing/Vision screen Hearing Screening - Comments:: No trouble hearing  Vision Screening - Comments:: Up to Date  Dr. Emily FilbertGould  Dietary issues and exercise activities discussed:     Goals Addressed             This Visit's Progress    Patient Stated       Would like to start water aerobics        Depression Screen PHQ 2/9 Scores 08/21/2020 07/25/2020 04/25/2020 03/13/2020 12/23/2019 09/10/2019 08/16/2019  PHQ - 2 Score 0 0 0 0 0 0 0  PHQ- 9 Score - 0 0 0 0 0 0    Fall Risk Fall Risk  08/21/2020 07/25/2020 07/25/2020 04/25/2020 03/13/2020  Falls in the past year? 0 1 0 0 0   Number falls in past yr: 0 0 0 0 0  Injury with Fall? 0 1 0 0 0  Comment - bruise right arm - - -  Risk for fall due to : - - No Fall Risks No Fall Risks No Fall Risks  Follow up Falls evaluation completed;Falls prevention discussed - - - -    FALL RISK PREVENTION PERTAINING TO THE HOME:  Any stairs in or around the home? No  If so,  are there any without handrails? No  Home free of loose throw rugs in walkways, pet beds, electrical cords, etc? Yes  Adequate lighting in your home to reduce risk of falls? Yes   ASSISTIVE DEVICES UTILIZED TO PREVENT FALLS:  Life alert? No  Use of a cane, walker or w/c? No  Grab bars in the bathroom? Yes  Shower chair or bench in shower? Yes  Elevated toilet seat or a handicapped toilet? Yes   TIMED UP AND GO:  Was the test performed? No .  Tele-Health  Cognitive Function:     6CIT Screen 08/03/2019  What Year? 0 points  What month? 0 points  What time? 0 points  Count back from 20 0 points  Months in reverse 0 points  Repeat phrase 0 points  Total Score 0    Immunizations Immunization History  Administered Date(s) Administered   Fluad Quad(high Dose 65+) 12/04/2019   H1N1 03/01/2008   Influenza, High Dose Seasonal PF 11/17/2011, 11/25/2014, 11/21/2015, 11/20/2016   Influenza-Unspecified 12/21/1998, 11/16/2003, 11/26/2004, 11/19/2005, 11/25/2006, 03/01/2008, 10/05/2008   Moderna Sars-Covid-2 Vaccination 02/16/2019, 03/17/2019, 12/22/2019   Pneumococcal Conjugate-13 12/14/2014   Pneumococcal Polysaccharide-23 12/21/1998, 11/11/2008   Td 02/17/2007   Tdap 12/16/2008, 08/08/2020   Zoster Recombinat (Shingrix) 04/09/2005, 08/08/2020   Zoster, Live 04/09/2005    TDAP status: Up to date  Flu Vaccine status: Up to date  Pneumococcal vaccine status: Up to date  Covid-19 vaccine status: Completed vaccines  Qualifies for Shingles Vaccine? Yes   Zostavax completed Yes   Shingrix Completed?: Yes  Screening Tests Health  Maintenance  Topic Date Due   DEXA SCAN  Never done   COVID-19 Vaccine (4 - Booster for Moderna series) 02/03/2021 (Originally 04/20/2020)   INFLUENZA VACCINE  09/04/2020   TETANUS/TDAP  08/09/2030   PNA vac Low Risk Adult  Completed   Zoster Vaccines- Shingrix  Completed   HPV VACCINES  Aged Out    Health Maintenance  Health Maintenance Due  Topic Date Due   DEXA SCAN  Never done    Colorectal cancer screening: No longer required.   Mammogram status: No longer required due to age.  Bone Density status: Ordered  . Pt provided with contact info and advised to call to schedule appt.  Lung Cancer Screening: (Low Dose CT Chest recommended if Age 65-80 years, 30 pack-year currently smoking OR have quit w/in 15years.) does not qualify.   Lung Cancer Screening Referral: na  Additional Screening:  Hepatitis C Screening: does not qualify;   Vision Screening: Recommended annual ophthalmology exams for early detection of glaucoma and other disorders of the eye. Is the patient up to date with their annual eye exam?  Yes  Who is the provider or what is the name of the office in which the patient attends annual eye exams? Dr Emily Filbert If pt is not established with a provider, would they like to be referred to a provider to establish care?  established .   Dental Screening: Recommended annual dental exams for proper oral hygiene  Community Resource Referral / Chronic Care Management: CRR required this visit?  No   CCM required this visit?  No      Plan:     I have personally reviewed and noted the following in the patient's chart:   Medical and social history Use of alcohol, tobacco or illicit drugs  Current medications and supplements including opioid prescriptions.  Functional ability and status Nutritional status Physical activity Advanced directives List of other physicians  Hospitalizations, surgeries, and ER visits in previous 12 months Vitals Screenings to include  cognitive, depression, and falls Referrals and appointments  In addition, I have reviewed and discussed with patient certain preventive protocols, quality metrics, and best practice recommendations. A written personalized care plan for preventive services as well as general preventive health recommendations were provided to patient.     Remi Haggard, LPN   7/82/4235   Nurse Notes: na

## 2020-08-21 NOTE — Patient Instructions (Signed)
Sabrina Castaneda , Thank you for taking time to come for your Medicare Wellness Visit. I appreciate your ongoing commitment to your health goals. Please review the following plan we discussed and let me know if I can assist you in the future.   Screening recommendations/referrals: Colonoscopy: no longer required Mammogram: no longer required Bone Density: education provided Recommended yearly ophthalmology/optometry visit for glaucoma screening and checkup Recommended yearly dental visit for hygiene and checkup  Vaccinations: Influenza vaccine: up to date Pneumococcal vaccine: up to date Tdap vaccine: up to date Shingles vaccine: up to date    Advanced directives: copy requested  Conditions/risks identified: none     Preventive Care 65 Years and Older, Female Preventive care refers to lifestyle choices and visits with your health care provider that can promote health and wellness. What does preventive care include? A yearly physical exam. This is also called an annual well check. Dental exams once or twice a year. Routine eye exams. Ask your health care provider how often you should have your eyes checked. Personal lifestyle choices, including: Daily care of your teeth and gums. Regular physical activity. Eating a healthy diet. Avoiding tobacco and drug use. Limiting alcohol use. Practicing safe sex. Taking low-dose aspirin every day. Taking vitamin and mineral supplements as recommended by your health care provider. What happens during an annual well check? The services and screenings done by your health care provider during your annual well check will depend on your age, overall health, lifestyle risk factors, and family history of disease. Counseling  Your health care provider may ask you questions about your: Alcohol use. Tobacco use. Drug use. Emotional well-being. Home and relationship well-being. Sexual activity. Eating habits. History of falls. Memory and ability to  understand (cognition). Work and work Astronomer. Reproductive health. Screening  You may have the following tests or measurements: Height, weight, and BMI. Blood pressure. Lipid and cholesterol levels. These may be checked every 5 years, or more frequently if you are over 66 years old. Skin check. Lung cancer screening. You may have this screening every year starting at age 56 if you have a 30-pack-year history of smoking and currently smoke or have quit within the past 15 years. Fecal occult blood test (FOBT) of the stool. You may have this test every year starting at age 6. Flexible sigmoidoscopy or colonoscopy. You may have a sigmoidoscopy every 5 years or a colonoscopy every 10 years starting at age 41. Hepatitis C blood test. Hepatitis B blood test. Sexually transmitted disease (STD) testing. Diabetes screening. This is done by checking your blood sugar (glucose) after you have not eaten for a while (fasting). You may have this done every 1-3 years. Bone density scan. This is done to screen for osteoporosis. You may have this done starting at age 53. Mammogram. This may be done every 1-2 years. Talk to your health care provider about how often you should have regular mammograms. Talk with your health care provider about your test results, treatment options, and if necessary, the need for more tests. Vaccines  Your health care provider may recommend certain vaccines, such as: Influenza vaccine. This is recommended every year. Tetanus, diphtheria, and acellular pertussis (Tdap, Td) vaccine. You may need a Td booster every 10 years. Zoster vaccine. You may need this after age 73. Pneumococcal 13-valent conjugate (PCV13) vaccine. One dose is recommended after age 40. Pneumococcal polysaccharide (PPSV23) vaccine. One dose is recommended after age 31. Talk to your health care provider about which screenings and vaccines  you need and how often you need them. This information is not  intended to replace advice given to you by your health care provider. Make sure you discuss any questions you have with your health care provider. Document Released: 02/17/2015 Document Revised: 10/11/2015 Document Reviewed: 11/22/2014 Elsevier Interactive Patient Education  2017 [place] Prevention in the Home Falls can cause injuries. They can happen to people of all ages. There are many things you can do to make your home safe and to help prevent falls. What can I do on the outside of my home? Regularly fix the edges of walkways and driveways and fix any cracks. Remove anything that might make you trip as you walk through a door, such as a raised step or threshold. Trim any bushes or trees on the path to your home. Use bright outdoor lighting. Clear any walking paths of anything that might make someone trip, such as rocks or tools. Regularly check to see if handrails are loose or broken. Make sure that both sides of any steps have handrails. Any raised decks and porches should have guardrails on the edges. Have any leaves, snow, or ice cleared regularly. Use sand or salt on walking paths during winter. Clean up any spills in your garage right away. This includes oil or grease spills. What can I do in the bathroom? Use night lights. Install grab bars by the toilet and in the tub and shower. Do not use towel bars as grab bars. Use non-skid mats or decals in the tub or shower. If you need to sit down in the shower, use a plastic, non-slip stool. Keep the floor dry. Clean up any water that spills on the floor as soon as it happens. Remove soap buildup in the tub or shower regularly. Attach bath mats securely with double-sided non-slip rug tape. Do not have throw rugs and other things on the floor that can make you trip. What can I do in the bedroom? Use night lights. Make sure that you have a light by your bed that is easy to reach. Do not use any sheets or blankets that are  too big for your bed. They should not hang down onto the floor. Have a firm chair that has side arms. You can use this for support while you get dressed. Do not have throw rugs and other things on the floor that can make you trip. What can I do in the kitchen? Clean up any spills right away. Avoid walking on wet floors. Keep items that you use a lot in easy-to-reach places. If you need to reach something above you, use a strong step stool that has a grab bar. Keep electrical cords out of the way. Do not use floor polish or wax that makes floors slippery. If you must use wax, use non-skid floor wax. Do not have throw rugs and other things on the floor that can make you trip. What can I do with my stairs? Do not leave any items on the stairs. Make sure that there are handrails on both sides of the stairs and use them. Fix handrails that are broken or loose. Make sure that handrails are as long as the stairways. Check any carpeting to make sure that it is firmly attached to the stairs. Fix any carpet that is loose or worn. Avoid having throw rugs at the top or bottom of the stairs. If you do have throw rugs, attach them to the floor with carpet tape. Make sure  that you have a light switch at the top of the stairs and the bottom of the stairs. If you do not have them, ask someone to add them for you. What else can I do to help prevent falls? Wear shoes that: Do not have high heels. Have rubber bottoms. Are comfortable and fit you well. Are closed at the toe. Do not wear sandals. If you use a stepladder: Make sure that it is fully opened. Do not climb a closed stepladder. Make sure that both sides of the stepladder are locked into place. Ask someone to hold it for you, if possible. Clearly mark and make sure that you can see: Any grab bars or handrails. First and last steps. Where the edge of each step is. Use tools that help you move around (mobility aids) if they are needed. These  include: Canes. Walkers. Scooters. Crutches. Turn on the lights when you go into a dark area. Replace any light bulbs as soon as they burn out. Set up your furniture so you have a clear path. Avoid moving your furniture around. If any of your floors are uneven, fix them. If there are any pets around you, be aware of where they are. Review your medicines with your doctor. Some medicines can make you feel dizzy. This can increase your chance of falling. Ask your doctor what other things that you can do to help prevent falls. This information is not intended to replace advice given to you by your health care provider. Make sure you discuss any questions you have with your health care provider. Document Released: 11/17/2008 Document Revised: 06/29/2015 Document Reviewed: 02/25/2014 Elsevier Interactive Patient Education  2017 [employer].

## 2020-09-11 ENCOUNTER — Encounter: Payer: Medicare Other | Admitting: Family Medicine

## 2020-09-20 ENCOUNTER — Ambulatory Visit: Payer: Medicare Other | Admitting: Registered Nurse

## 2020-09-26 LAB — HM DEXA SCAN

## 2020-09-29 ENCOUNTER — Other Ambulatory Visit: Payer: Self-pay | Admitting: Family Medicine

## 2020-10-17 ENCOUNTER — Telehealth: Payer: Self-pay

## 2020-10-17 ENCOUNTER — Other Ambulatory Visit: Payer: Self-pay

## 2020-10-17 DIAGNOSIS — M8000XA Age-related osteoporosis with current pathological fracture, unspecified site, initial encounter for fracture: Secondary | ICD-10-CM

## 2020-10-17 MED ORDER — ALENDRONATE SODIUM 70 MG PO TABS: 70.0000 mg | ORAL_TABLET | ORAL | 3 refills | Status: DC

## 2020-10-17 NOTE — Telephone Encounter (Signed)
Spoke with patient about her dexa scan. Patient decided on the Fosamax 70mg  weekly

## 2020-10-17 NOTE — Telephone Encounter (Signed)
Please send Fosamax 70mg - 1 tab weekly, #12, 3 refills to preferred pharmacy

## 2020-10-17 NOTE — Telephone Encounter (Signed)
Fosamax has been filled for 1 tab wkly #12 3 refills sent to patient pharmacy

## 2020-11-08 ENCOUNTER — Ambulatory Visit: Payer: Medicare Other | Admitting: Family Medicine

## 2020-11-08 ENCOUNTER — Other Ambulatory Visit: Payer: Self-pay

## 2020-11-08 ENCOUNTER — Ambulatory Visit (INDEPENDENT_AMBULATORY_CARE_PROVIDER_SITE_OTHER): Payer: Medicare Other | Admitting: Registered Nurse

## 2020-11-08 ENCOUNTER — Encounter: Payer: Self-pay | Admitting: Registered Nurse

## 2020-11-08 VITALS — BP 128/78 | HR 70 | Temp 98.3°F | Resp 18 | Ht 63.0 in | Wt 144.8 lb

## 2020-11-08 DIAGNOSIS — R82998 Other abnormal findings in urine: Secondary | ICD-10-CM | POA: Diagnosis not present

## 2020-11-08 DIAGNOSIS — M549 Dorsalgia, unspecified: Secondary | ICD-10-CM

## 2020-11-08 LAB — POCT URINALYSIS DIP (MANUAL ENTRY)
Bilirubin, UA: NEGATIVE
Blood, UA: NEGATIVE
Glucose, UA: NEGATIVE mg/dL
Ketones, POC UA: NEGATIVE mg/dL
Nitrite, UA: NEGATIVE
Protein Ur, POC: NEGATIVE mg/dL
Spec Grav, UA: 1.01 (ref 1.010–1.025)
Urobilinogen, UA: 0.2 E.U./dL
pH, UA: 6 (ref 5.0–8.0)

## 2020-11-08 MED ORDER — METHOCARBAMOL 500 MG PO TABS: 250.0000 mg | ORAL_TABLET | Freq: Two times a day (BID) | ORAL | 0 refills | Status: DC | PRN

## 2020-11-08 MED ORDER — SULFAMETHOXAZOLE-TRIMETHOPRIM 800-160 MG PO TABS: 1.0000 | ORAL_TABLET | Freq: Two times a day (BID) | ORAL | 0 refills | Status: DC

## 2020-11-08 MED ORDER — MELOXICAM 7.5 MG PO TABS: 7.5000 mg | ORAL_TABLET | Freq: Every day | ORAL | 0 refills | Status: DC

## 2020-11-08 NOTE — Progress Notes (Signed)
Established Patient Office Visit  Subjective:  Patient ID: Sabrina Castaneda, female    DOB: 03/24/1935  Age: 85 y.o. MRN: [id number]  CC:  Chief Complaint  Patient presents with   Back Pain    Patient states she has been having some back pain and does not know if its uti because its in the left side.    HPI Sabrina Castaneda presents for back pain  Lower left side Stabbing, burning Ddd known, but this feels different  Preparing for trip - transatlantic flight, a lot of walking in [place]  Has done a lot of PT and stretching in past, good effect.   Has not been on meds for pain in the past. Has been offered injection in past, but did not pursue.   Past Medical History:  Diagnosis Date   Allergy    GERD (gastroesophageal reflux disease)    History of chicken pox    Hyperlipidemia    Scoliosis    Thyroid disease     Past Surgical History:  Procedure Laterality Date   ABDOMINAL HYSTERECTOMY  1980   ADENOIDECTOMY     HERNIA REPAIR  02/04/1966   REPLACEMENT TOTAL KNEE Right    TONSILLECTOMY     WRIST FRACTURE SURGERY Bilateral     Family History  Problem Relation Age of Onset   Allergic rhinitis Neg Hx    Angioedema Neg Hx    Asthma Neg Hx    Atopy Neg Hx    Eczema Neg Hx    Immunodeficiency Neg Hx    Urticaria Neg Hx     Social History   Socioeconomic History   Marital status: Widowed    Spouse name: Not on file   Number of children: Not on file   Years of education: Not on file   Highest education level: Not on file  Occupational History   Not on file  Tobacco Use   Smoking status: Never   Smokeless tobacco: Never  Vaping Use   Vaping Use: Never used  Substance and Sexual Activity   Alcohol use: Yes    Comment: occasionally   Drug use: Never   Sexual activity: Not Currently  Other Topics Concern   Not on file  Social History Narrative   Not on file   Social Determinants of Health   Financial Resource Strain: Low Risk    Difficulty of Paying  Living Expenses: Not hard at all  Food Insecurity: No Food Insecurity   Worried About Programme researcher, broadcasting/film/video in the Last Year: Never true   Ran Out of Food in the Last Year: Never true  Transportation Needs: No Transportation Needs   Lack of Transportation (Medical): No   Lack of Transportation (Non-Medical): No  Physical Activity: Inactive   Days of Exercise per Week: 0 days   Minutes of Exercise per Session: 0 min  Stress: No Stress Concern Present   Feeling of Stress : Not at all  Social Connections: Socially Isolated   Frequency of Communication with Friends and Family: More than three times a week   Frequency of Social Gatherings with Friends and Family: More than three times a week   Attends Religious Services: Never   Database administrator or Organizations: No   Attends Banker Meetings: Never   Marital Status: Widowed  Intimate Partner Violence: Not At Risk   Fear of Current or Ex-Partner: No   Emotionally Abused: No   Physically Abused: No   Sexually  Abused: No    Outpatient Medications Prior to Visit  Medication Sig Dispense Refill   alendronate (FOSAMAX) 70 MG tablet Take 1 tablet (70 mg total) by mouth every 7 (seven) days. 12 tablet 3   aspirin 81 MG EC tablet Take by mouth.     atorvastatin (LIPITOR) 40 MG tablet Take 40 mg by mouth daily.     Cholecalciferol 50 MCG (2000 UT) CAPS TAKE 2 CAPSULES BY MOUTH EVERY DAY     levothyroxine (SYNTHROID) 75 MCG tablet TAKE 1 TABLET BY MOUTH EVERY DAY 90 tablet 1   loratadine (CLARITIN) 10 MG tablet Take by mouth.     omeprazole (PRILOSEC) 40 MG capsule TAKE 1 CAPSULE BY MOUTH EVERY DAY 90 capsule 1   oxybutynin (DITROPAN-XL) 10 MG 24 hr tablet TAKE 1 TABLET BY MOUTH EVERY DAY 90 tablet 1   sertraline (ZOLOFT) 25 MG tablet TAKE 1 TABLET (25 MG TOTAL) BY MOUTH DAILY. 90 tablet 0   diclofenac Sodium (VOLTAREN) 1 % GEL Apply 4 g topically 4 (four) times daily as needed. 500 g 6   No facility-administered medications  prior to visit.    No Known Allergies  ROS Review of Systems  Constitutional: Negative.   HENT: Negative.    Eyes: Negative.   Respiratory: Negative.    Cardiovascular: Negative.   Gastrointestinal: Negative.   Genitourinary: Negative.   Musculoskeletal:  Positive for back pain. Negative for arthralgias, gait problem, joint swelling, myalgias, neck pain and neck stiffness.  Skin: Negative.   Neurological: Negative.   Psychiatric/Behavioral: Negative.    All other systems reviewed and are negative.    Objective:    Physical Exam Vitals and nursing note reviewed.  Constitutional:      General: She is not in acute distress.    Appearance: Normal appearance. She is normal weight. She is not ill-appearing, toxic-appearing or diaphoretic.  Cardiovascular:     Rate and Rhythm: Normal rate and regular rhythm.     Heart sounds: Normal heart sounds. No murmur heard.   No friction rub. No gallop.  Pulmonary:     Effort: Pulmonary effort is normal. No respiratory distress.     Breath sounds: Normal breath sounds. No stridor. No wheezing, rhonchi or rales.  Chest:     Chest wall: No tenderness.  Musculoskeletal:        General: No swelling, tenderness, deformity or signs of injury. Normal range of motion.     Right lower leg: No edema.     Left lower leg: No edema.  Skin:    General: Skin is warm and dry.  Neurological:     General: No focal deficit present.     Mental Status: She is alert and oriented to person, place, and time. Mental status is at baseline.  Psychiatric:        Mood and Affect: Mood normal.        Behavior: Behavior normal.        Thought Content: Thought content normal.        Judgment: Judgment normal.    BP 128/78   Pulse 70   Temp 98.3 F (36.8 C) (Temporal)   Resp 18   Ht 5\' 3"  (1.6 m)   Wt 144 lb 12.8 oz (65.7 kg)   SpO2 99%   BMI 25.65 kg/m  Wt Readings from Last 3 Encounters:  11/08/20 144 lb 12.8 oz (65.7 kg)  07/25/20 143 lb 9.6 oz  (65.1 kg)  04/25/20 149 lb 9.6 oz (  67.9 kg)     There are no preventive care reminders to display for this patient.  There are no preventive care reminders to display for this patient.  Lab Results  Component Value Date   TSH 0.81 03/13/2020   Lab Results  Component Value Date   WBC 5.9 03/13/2020   HGB 14.2 03/13/2020   HCT 41.5 03/13/2020   MCV 85.8 03/13/2020   PLT 335.0 03/13/2020   Lab Results  Component Value Date   NA 137 03/13/2020   K 3.9 03/13/2020   CO2 28 03/13/2020   GLUCOSE 90 03/13/2020   BUN 10 03/13/2020   CREATININE 0.64 03/13/2020   BILITOT 0.8 03/13/2020   ALKPHOS 65 03/13/2020   AST 17 03/13/2020   ALT 14 03/13/2020   PROT 6.8 03/13/2020   ALBUMIN 4.2 03/13/2020   CALCIUM 9.5 03/13/2020   GFR 82.33 03/13/2020   Lab Results  Component Value Date   CHOL 179 03/13/2020   Lab Results  Component Value Date   HDL 58.30 03/13/2020   Lab Results  Component Value Date   LDLCALC 94 03/13/2020   Lab Results  Component Value Date   TRIG 134.0 03/13/2020   Lab Results  Component Value Date   CHOLHDL 3 03/13/2020   No results found for: HGBA1C    Assessment & Plan:   Problem List Items Addressed This Visit   None Visit Diagnoses     Back pain, unspecified back location, unspecified back pain laterality, unspecified chronicity    -  Primary   Relevant Medications   meloxicam (MOBIC) 7.5 MG tablet   methocarbamol (ROBAXIN) 500 MG tablet   Other Relevant Orders   POCT urinalysis dipstick (Completed)   Urine Culture   Leukocytes in urine       Relevant Medications   sulfamethoxazole-trimethoprim (BACTRIM DS) 800-160 MG tablet       Meds ordered this encounter  Medications   sulfamethoxazole-trimethoprim (BACTRIM DS) 800-160 MG tablet    Sig: Take 1 tablet by mouth 2 (two) times daily.    Dispense:  6 tablet    Refill:  0    Order Specific Question:   Supervising Provider    Answer:   Neva Seat, JEFFREY R [[id number]]   meloxicam  (MOBIC) 7.5 MG tablet    Sig: Take 1 tablet (7.5 mg total) by mouth daily.    Dispense:  30 tablet    Refill:  0    Order Specific Question:   Supervising Provider    Answer:   Neva Seat, JEFFREY R [[id number]]   methocarbamol (ROBAXIN) 500 MG tablet    Sig: Take 0.5-1 tablets (250-500 mg total) by mouth 2 (two) times daily as needed for muscle spasms.    Dispense:  60 tablet    Refill:  0    Order Specific Question:   Supervising Provider    Answer:   Neva Seat, JEFFREY R [[id number]]    Follow-up: Return if symptoms worsen or fail to improve.   PLAN POCT UA shows leukocytes. Will send bactrim in case but will have pt hold until culture results back. Sending bactrim in case culture shows UTI - pt traveling abroad tomorrow, would prefer to have in hand. Back pain - suspect mask given known hx, will give mobic and robaxin. Reviewed risks, benefits, AE, and alternatives. Pt voices understanding Patient encouraged to call clinic with any questions, comments, or concerns.   Janeece Agee, NP

## 2020-11-08 NOTE — Patient Instructions (Addendum)
Ms. Mcginniss -   I'm supremely jealous of your trip! I'm sure you'll have a great time  In brief  Bactrim - sending in case of UTI. Will post results of culture to MyChart.   Meloxicam (Mobic) - antiinflammatory. One a day should do the trick. Do not take ibuprofen with this, ok to pair with tylenol  Methocarbamol (Robaxin) - muscle relaxer - use one or two a day as needed. Use with caution - can be sedative. I would not take this with wine!  Thank you,  Rich     If you have lab work done today you will be contacted with your lab results within the next 2 weeks.  If you have not heard from [place] then please contact us. The fastest way to get your results is to register for My Chart.   IF you received an x-ray today, you will receive an invoice from [facility]. Please contact [facility] at [phone] with questions or concerns regarding your invoice.   IF you received labwork today, you will receive an invoice from [facility]. Please contact [facility] at [phone] with questions or concerns regarding your invoice.   Our billing staff will not be able to assist you with questions regarding bills from these companies.  You will be contacted with the lab results as soon as they are available. The fastest way to get your results is to activate your My Chart account. Instructions are located on the last page of this paperwork. If you have not heard from [place] regarding the results in 2 weeks, please contact this office.

## 2020-11-09 LAB — URINE CULTURE
MICRO NUMBER:: 12464608
Result:: NO GROWTH
SPECIMEN QUALITY:: ADEQUATE

## 2020-11-10 ENCOUNTER — Ambulatory Visit: Payer: Medicare Other | Admitting: Family Medicine

## 2020-12-01 ENCOUNTER — Ambulatory Visit: Payer: Medicare Other | Admitting: Family Medicine

## 2020-12-01 ENCOUNTER — Encounter: Payer: Self-pay | Admitting: Family Medicine

## 2020-12-01 ENCOUNTER — Other Ambulatory Visit: Payer: Self-pay

## 2020-12-01 VITALS — BP 130/80 | HR 80 | Temp 97.9°F | Resp 17 | Ht 63.0 in | Wt 144.2 lb

## 2020-12-01 DIAGNOSIS — F32A Depression, unspecified: Secondary | ICD-10-CM | POA: Diagnosis not present

## 2020-12-01 DIAGNOSIS — E785 Hyperlipidemia, unspecified: Secondary | ICD-10-CM

## 2020-12-01 DIAGNOSIS — E039 Hypothyroidism, unspecified: Secondary | ICD-10-CM | POA: Diagnosis not present

## 2020-12-01 DIAGNOSIS — Z23 Encounter for immunization: Secondary | ICD-10-CM | POA: Diagnosis not present

## 2020-12-01 LAB — LIPID PANEL
Cholesterol: 207 mg/dL — ABNORMAL HIGH (ref 0–200)
HDL: 61.4 mg/dL (ref >39.00)
LDL Cholesterol: 113 mg/dL — ABNORMAL HIGH (ref 0–99)
NonHDL: 145.58
Total CHOL/HDL Ratio: 3
Triglycerides: 161 mg/dL — ABNORMAL HIGH (ref 0.0–149.0)
VLDL: 32.2 mg/dL (ref 0.0–40.0)

## 2020-12-01 LAB — TSH: TSH: 1.25 u[IU]/mL (ref 0.35–5.50)

## 2020-12-01 LAB — CBC WITH DIFFERENTIAL/PLATELET
Basophils Absolute: 0 10*3/uL (ref 0.0–0.1)
Basophils Relative: 0.7 % (ref 0.0–3.0)
Eosinophils Absolute: 0.1 10*3/uL (ref 0.0–0.7)
Eosinophils Relative: 1.1 % (ref 0.0–5.0)
HCT: 43.4 % (ref 36.0–46.0)
Hemoglobin: 14.2 g/dL (ref 12.0–15.0)
Lymphocytes Relative: 24.3 % (ref 12.0–46.0)
Lymphs Abs: 1.5 10*3/uL (ref 0.7–4.0)
MCHC: 32.8 g/dL (ref 30.0–36.0)
MCV: 86.4 fl (ref 78.0–100.0)
Monocytes Absolute: 0.7 10*3/uL (ref 0.1–1.0)
Monocytes Relative: 11.2 % (ref 3.0–12.0)
Neutro Abs: 3.8 10*3/uL (ref 1.4–7.7)
Neutrophils Relative %: 62.7 % (ref 43.0–77.0)
Platelets: 455 10*3/uL — ABNORMAL HIGH (ref 150.0–400.0)
RBC: 5.02 Mil/uL (ref 3.87–5.11)
RDW: 13.8 % (ref 11.5–15.5)
WBC: 6 10*3/uL (ref 4.0–10.5)

## 2020-12-01 LAB — HEPATIC FUNCTION PANEL
ALT: 17 U/L (ref 0–35)
AST: 16 U/L (ref 0–37)
Albumin: 4.4 g/dL (ref 3.5–5.2)
Alkaline Phosphatase: 79 U/L (ref 39–117)
Bilirubin, Direct: 0.1 mg/dL (ref 0.0–0.3)
Total Bilirubin: 0.9 mg/dL (ref 0.2–1.2)
Total Protein: 6.9 g/dL (ref 6.0–8.3)

## 2020-12-01 LAB — BASIC METABOLIC PANEL
BUN: 11 mg/dL (ref 6–23)
CO2: 28 mEq/L (ref 19–32)
Calcium: 9.5 mg/dL (ref 8.4–10.5)
Chloride: 99 mEq/L (ref 96–112)
Creatinine, Ser: 0.69 mg/dL (ref 0.40–1.20)
GFR: 80.44 mL/min (ref >60.00)
Glucose, Bld: 95 mg/dL (ref 70–99)
Potassium: 4.9 mEq/L (ref 3.5–5.1)
Sodium: 135 mEq/L (ref 135–145)

## 2020-12-01 NOTE — Assessment & Plan Note (Signed)
Chronic problem.  On Levothyroxine and currently asymptomatic.  Check labs and adjust meds prn.

## 2020-12-01 NOTE — Assessment & Plan Note (Signed)
Pt reports mood is good and she is not interested in adjusting medication at this time.  Will continue to follow.

## 2020-12-01 NOTE — Patient Instructions (Signed)
Schedule your complete physical in 6 months We'll notify you of your lab results and make any changes if needed Keep up the good work on healthy diet and regular physical activity- you look great! Call with any questions or concerns Happy Holidays!!

## 2020-12-01 NOTE — Progress Notes (Signed)
   Subjective:    Patient ID: Sabrina Castaneda, female    DOB: 1935/07/07, 85 y.o.   MRN: [id number]  HPI Hypothyroid- chronic problem, on Levothyroxine daily.  Pt feels energy level is good.  Denies changes to skin/hair/nail.  Hyperlipidemia- chronic problem, on Lipitor 40mg  daily.  No CP, SOB, abd pain, N/V.  Depression- started on Sertraline in Feb.  Currently on 25mg  daily.  Feels mood is good at this time.     Review of Systems For ROS see HPI   This visit occurred during the SARS-CoV-2 public health emergency.  Safety protocols were in place, including screening questions prior to the visit, additional usage of staff PPE, and extensive cleaning of exam room while observing appropriate contact time as indicated for disinfecting solutions.      Objective:   Physical Exam Vitals reviewed.  Constitutional:      General: She is not in acute distress.    Appearance: Normal appearance. She is well-developed. She is not ill-appearing.  HENT:     Head: Normocephalic and atraumatic.  Eyes:     Conjunctiva/sclera: Conjunctivae normal.     Pupils: Pupils are equal, round, and reactive to light.  Neck:     Thyroid: No thyromegaly.  Cardiovascular:     Rate and Rhythm: Normal rate and regular rhythm.     Pulses: Normal pulses.     Heart sounds: Normal heart sounds. No murmur heard. Pulmonary:     Effort: Pulmonary effort is normal. No respiratory distress.     Breath sounds: Normal breath sounds.  Abdominal:     General: There is no distension.     Palpations: Abdomen is soft.     Tenderness: There is no abdominal tenderness.  Musculoskeletal:     Cervical back: Normal range of motion and neck supple.     Right lower leg: No edema.     Left lower leg: No edema.  Lymphadenopathy:     Cervical: No cervical adenopathy.  Skin:    General: Skin is warm and dry.  Neurological:     General: No focal deficit present.     Mental Status: She is alert and oriented to person, place,  and time.  Psychiatric:        Mood and Affect: Mood normal.        Behavior: Behavior normal.          Assessment & Plan:

## 2020-12-01 NOTE — Assessment & Plan Note (Signed)
Chronic problem.  Tolerating Lipitor w/o difficulty.  Check labs.  Adjust meds prn  

## 2020-12-05 ENCOUNTER — Other Ambulatory Visit: Payer: Self-pay | Admitting: Registered Nurse

## 2020-12-05 DIAGNOSIS — M549 Dorsalgia, unspecified: Secondary | ICD-10-CM

## 2020-12-20 ENCOUNTER — Other Ambulatory Visit: Payer: Self-pay | Admitting: Family Medicine

## 2021-01-15 ENCOUNTER — Other Ambulatory Visit: Payer: Self-pay | Admitting: Family Medicine

## 2021-02-12 ENCOUNTER — Other Ambulatory Visit: Payer: Self-pay | Admitting: Family Medicine

## 2021-05-02 ENCOUNTER — Emergency Department (HOSPITAL_BASED_OUTPATIENT_CLINIC_OR_DEPARTMENT_OTHER)
Admission: EM | Admit: 2021-05-02 | Discharge: 2021-05-02 | Disposition: A | Discharge status: Home / Self Care | Payer: Medicare HMO | Attending: Emergency Medicine | Admitting: Emergency Medicine

## 2021-05-02 ENCOUNTER — Emergency Department (HOSPITAL_BASED_OUTPATIENT_CLINIC_OR_DEPARTMENT_OTHER): Payer: Medicare HMO | Admitting: Radiology

## 2021-05-02 ENCOUNTER — Other Ambulatory Visit: Payer: Self-pay

## 2021-05-02 ENCOUNTER — Encounter (HOSPITAL_BASED_OUTPATIENT_CLINIC_OR_DEPARTMENT_OTHER): Payer: Self-pay

## 2021-05-02 ENCOUNTER — Emergency Department (HOSPITAL_BASED_OUTPATIENT_CLINIC_OR_DEPARTMENT_OTHER): Payer: Medicare HMO

## 2021-05-02 DIAGNOSIS — Z7982 Long term (current) use of aspirin: Secondary | ICD-10-CM | POA: Diagnosis not present

## 2021-05-02 DIAGNOSIS — M25562 Pain in left knee: Secondary | ICD-10-CM | POA: Insufficient documentation

## 2021-05-02 NOTE — ED Provider Notes (Signed)
?[facility] EMERGENCY DEPT ?Provider Note ? ? ?CSN: [id number] ?Arrival date & time: 05/02/21  1128 ? ?  ? ?History ? ?Chief Complaint  ?Patient presents with  ? Knee Pain  ? ? ?Sabrina Castaneda is a 86 y.o. female who presents to the emergency department complaining of atraumatic left knee pain onset 5-6 days ago.  She notes that she was able to complete her ADLs up until yesterday due to increasing knee pain.  She has tried Tylenol and heating pad at home for her symptoms along with ice with no relief.  Has associated left hip pain.  Patient notes she has a history of arthritis to her left hip and she is followed by an orthopedist.  Denies color change, redness, wound. ? ? ?The history is provided by the patient. No language interpreter was used.  ? ?  ? ?Home Medications ?Prior to Admission medications   ?Medication Sig Start Date End Date Taking? Authorizing Provider  ?alendronate (FOSAMAX) 70 MG tablet Take 1 tablet (70 mg total) by mouth every 7 (seven) days. 10/17/20   Sheliah Hatchabori, Katherine E, MD  ?aspirin 81 MG EC tablet Take by mouth.    [provider]  ?atorvastatin (LIPITOR) 40 MG tablet Take 40 mg by mouth daily. 04/12/19   [provider]  ?Cholecalciferol 50 MCG (2000 UT) CAPS TAKE 2 CAPSULES BY MOUTH EVERY DAY 09/02/15   [provider]  ?levothyroxine (SYNTHROID) 75 MCG tablet TAKE 1 TABLET BY MOUTH EVERY DAY 01/15/21   Sheliah Hatchabori, Katherine E, MD  ?loratadine (CLARITIN) 10 MG tablet Take by mouth.    [provider]  ?meloxicam (MOBIC) 7.5 MG tablet TAKE 1 TABLET BY MOUTH EVERY DAY 12/05/20   Janeece AgeeMorrow, Richard, NP  ?methocarbamol (ROBAXIN) 500 MG tablet Take 0.5-1 tablets (250-500 mg total) by mouth 2 (two) times daily as needed for muscle spasms. 11/08/20   Janeece AgeeMorrow, Richard, NP  ?omeprazole (PRILOSEC) 40 MG capsule TAKE 1 CAPSULE BY MOUTH EVERY DAY 12/20/20   Sheliah Hatchabori, Katherine E, MD  ?oxybutynin (DITROPAN-XL) 10 MG 24 hr tablet TAKE 1 TABLET BY MOUTH EVERY DAY 01/15/21    Sheliah Hatchabori, Katherine E, MD  ?sertraline (ZOLOFT) 25 MG tablet TAKE 1 TABLET (25 MG TOTAL) BY MOUTH DAILY. 02/12/21   Sheliah Hatchabori, Katherine E, MD  ?   ? ?Allergies    ?Patient has no known allergies.   ? ?Review of Systems   ?Review of Systems  ?Musculoskeletal:  Positive for arthralgias and joint swelling.  ?Skin:  Negative for color change and wound.  ?All other systems reviewed and are negative. ? ?Physical Exam ?Updated Vital Signs ?BP (!) 141/66 (BP Location: Right Arm)   Pulse 76   Temp (!) 97.5 ?F (36.4 ?C)   Resp 18   SpO2 99%  ?Physical Exam ?Vitals and nursing note reviewed.  ?Constitutional:   ?   General: She is not in acute distress. ?   Appearance: Normal appearance.  ?Eyes:  ?   General: No scleral icterus. ?   Extraocular Movements: Extraocular movements intact.  ?Cardiovascular:  ?   Rate and Rhythm: Normal rate.  ?Pulmonary:  ?   Effort: Pulmonary effort is normal. No respiratory distress.  ?Musculoskeletal:  ?   Cervical back: Neck supple.  ?   Comments: No tenderness to palpation to anterior left knee.  Mild swelling noted to the lateral aspect of left knee without tenderness to palpation.  Tenderness to palpation noted to popliteal region no increased warmth to the joint.  Decreased flexion secondary to pain.  Patient able to extend without difficulty.  Negative straight leg raise.  Strength and sensation intact to bilateral lower extremities.  ?Skin: ?   General: Skin is warm and dry.  ?   Findings: No bruising, erythema or rash.  ?Neurological:  ?   Mental Status: She is alert.  ?Psychiatric:     ?   Behavior: Behavior normal.  ? ? ?ED Results / Procedures / Treatments   ?Labs ?(all labs ordered are listed, but only abnormal results are displayed) ?Labs Reviewed - No data to display ? ?EKG ?None ? ?Radiology ?US Venous Img Lower  Left (DVT Study) ? ?Result Date: 05/02/2021 ?CLINICAL DATA:  Left leg pain for 5 days EXAM: LEFT LOWER EXTREMITY VENOUS DOPPLER ULTRASOUND TECHNIQUE: Gray-scale sonography  with compression, as well as color and duplex ultrasound, were performed to evaluate the deep venous system(s) from the level of the common femoral vein through the popliteal and proximal calf veins. COMPARISON:  None. FINDINGS: VENOUS Normal compressibility of the common femoral, superficial femoral, and popliteal veins, as well as the visualized calf veins. Visualized portions of profunda femoral vein and great saphenous vein unremarkable. No filling defects to suggest DVT on grayscale or color Doppler imaging. Doppler waveforms show normal direction of venous flow, normal respiratory plasticity and response to augmentation. Limited views of the contralateral common femoral vein are unremarkable. OTHER None. Limitations: none IMPRESSION: Negative. Electronically Signed   By: Malachy Moan M.D.   On: 05/02/2021 14:30  ? ?DG Knee Complete 4 Views Left ? ?Result Date: 05/02/2021 ?CLINICAL DATA:  Acute left knee pain and swelling without known injury. EXAM: LEFT KNEE - COMPLETE 4+ VIEW COMPARISON:  None. FINDINGS: No evidence of fracture, dislocation, or joint effusion. Mild narrowing of medial joint space is noted. Soft tissues are unremarkable. IMPRESSION: Mild degenerative joint disease is noted medially. No acute abnormality seen. Electronically Signed   By: Lupita Raider M.D.   On: 05/02/2021 11:59   ? ?Procedures ?Procedures  ? ? ?Medications Ordered in ED ?Medications - No data to display ? ?ED Course/ Medical Decision Making/ A&P ?Clinical Course as of 05/02/21 1625  ?Wed May 02, 2021  ?1439 Discussed with patient and family at bedside regarding negative DVT study.  Discussed discharge treatment plan.  Patient appears safe for discharge. [SB]  ?  ?Clinical Course User Index ?[SB] Haylynn Pha A, PA-C  ? ?                        ?Medical Decision Making ?Amount and/or Complexity of Data Reviewed ?Radiology: ordered. ? ? ?Patient with atraumatic left knee pain onset 5-6 days.  Vital signs stable, patient  afebrile.  On exam patient without tenderness to palpation noted to anterior knee.  Decreased flexion of left knee secondary to pain.  Tenderness to palpation noted to left popliteal region.  No increased warmth to the joint.  Sensation and pulses intact bilaterally to lower extremities.  Strength intact to bilateral lower extremities.  Differential diagnosis includes fracture, dislocation, DVT, strain, osteoarthritis. ? ? ?Additional history obtained:  ?Additional history obtained from Daughter/Son ? ?Imaging: ?I ordered imaging studies including left knee x-ray and left DVT ultrasound study ?I independently visualized and interpreted imaging which showed: X-ray negative for acute fracture or dislocation.  Left DVT ultrasound study negative for DVT ?I agree with the radiologist interpretation ? ? ?Disposition: ?Patient presentation suspicious for degenerative changes of left knee.  Doubt fracture, dislocation, DVT at this time.  After consideration of the diagnostic results, I feel that the patient would benefit from Discharge home.  Patient provided with a knee immobilizer prior to discharge.  Also provided with ambulatory referral to sports medicine.  Strict return precautions provided to patient regarding fever, increasing/worsening knee swelling, color change, or gait issue. Supportive care measures and strict return precautions discussed with patient at bedside. Pt acknowledges and verbalizes understanding. Pt appears safe for discharge. Follow up as indicated in discharge paperwork.  ? ? ?This chart was dictated using voice recognition software, Dragon. Despite the best efforts of this provider to proofread and correct errors, errors may still occur which can change documentation meaning. ? ?Final Clinical Impression(s) / ED Diagnoses ?Final diagnoses:  ?Acute pain of left knee  ? ? ?Rx / DC Orders ?ED Discharge Orders   ? ?      Ordered  ?  AMB referral to sports medicine       ? 05/02/21 1437  ? ?  ?  ? ?   ? ? ?  ?Edana Aguado A, PA-C ?05/02/21 1628 ? ?  ?Ernie Avena, MD ?05/02/21 1745 ? ?

## 2021-05-02 NOTE — ED Notes (Signed)
EMT-P provided AVS using Teachback Method. Patient verbalizes understanding of Discharge Instructions. Opportunity for Questioning and Answers were provided by EMT-P. Patient Discharged from ED.  ? ?

## 2021-05-02 NOTE — ED Notes (Signed)
ED Provider at bedside. 

## 2021-05-02 NOTE — ED Triage Notes (Signed)
C/o non-traumatic left knee pain x 5 days "so bad now I can't walk on it". She denies fever, nor any other sign of current illness. ?

## 2021-05-02 NOTE — Discharge Instructions (Addendum)
It was a pleasure taking care of you today!  ? ?Your xray was negative for fracture or dislocation today. Your ultrasound didn't show any acute blood clot. Follow-up with sports medicine. You may take over the counter 500mg  tylenol every 6 hours as needed for pain for no more than 7 days.  You will be given a knee immobilizer and crutches.  Return to the emergency department for experiencing increasing/worsening knee pain, redness, swelling, worsening symptoms. ?

## 2021-05-02 NOTE — ED Notes (Signed)
Patient returned from US.

## 2021-05-09 ENCOUNTER — Emergency Department (HOSPITAL_BASED_OUTPATIENT_CLINIC_OR_DEPARTMENT_OTHER): Payer: Medicare HMO

## 2021-05-09 ENCOUNTER — Other Ambulatory Visit: Payer: Self-pay

## 2021-05-09 ENCOUNTER — Emergency Department (HOSPITAL_BASED_OUTPATIENT_CLINIC_OR_DEPARTMENT_OTHER)
Admission: EM | Admit: 2021-05-09 | Discharge: 2021-05-09 | Disposition: A | Discharge status: Home / Self Care | Payer: Medicare HMO | Attending: Emergency Medicine | Admitting: Emergency Medicine

## 2021-05-09 ENCOUNTER — Encounter (HOSPITAL_BASED_OUTPATIENT_CLINIC_OR_DEPARTMENT_OTHER): Payer: Self-pay | Admitting: Emergency Medicine

## 2021-05-09 DIAGNOSIS — S59911A Unspecified injury of right forearm, initial encounter: Secondary | ICD-10-CM | POA: Diagnosis present

## 2021-05-09 DIAGNOSIS — Z7982 Long term (current) use of aspirin: Secondary | ICD-10-CM | POA: Diagnosis not present

## 2021-05-09 DIAGNOSIS — S51811A Laceration without foreign body of right forearm, initial encounter: Secondary | ICD-10-CM | POA: Insufficient documentation

## 2021-05-09 DIAGNOSIS — W540XXA Bitten by dog, initial encounter: Secondary | ICD-10-CM | POA: Insufficient documentation

## 2021-05-09 MED ORDER — LIDOCAINE HCL (PF) 1 % IJ SOLN: 5.0000 mL | Freq: Once | INTRAMUSCULAR | Status: DC

## 2021-05-09 MED ORDER — LIDOCAINE-EPINEPHRINE (PF) 2 %-1:200000 IJ SOLN
INTRAMUSCULAR | Status: AC
  Filled 2021-05-09: qty 20

## 2021-05-09 MED ORDER — ACETAMINOPHEN 500 MG PO TABS
1000.0000 mg | ORAL_TABLET | Freq: Once | ORAL | Status: AC
  Administered 2021-05-09: 1000 mg via ORAL
  Filled 2021-05-09: qty 2

## 2021-05-09 MED ORDER — AMOXICILLIN-POT CLAVULANATE 875-125 MG PO TABS: 1.0000 | ORAL_TABLET | Freq: Two times a day (BID) | ORAL | 0 refills | Status: DC

## 2021-05-09 NOTE — ED Provider Notes (Signed)
?[facility] ?Provider Note ? ? ?CSN: [id number] ?Arrival date & time: 05/09/21  1348 ? ?  ? ?History ? ?Chief Complaint  ?Patient presents with  ? Animal Bite  ? ? ?AFRAH BURLISON is a 86 y.o. female. ? ?Patient presents to ER chief complaint of laceration to the right forearm.  This occurred earlier today just a few hours prior to arrival.  She had hand on her dog at home but the animal had been sleeping was startled awake and bit her on the forearm.  Animal shots are otherwise up-to-date.  Patient states her tetanus is up-to-date as well.  No other injury elsewhere.  Denies any fall or injury other injury no fever no cough no vomiting or diarrhea. ? ? ?  ? ?Home Medications ?Prior to Admission medications   ?Medication Sig Start Date End Date Taking? Authorizing Provider  ?amoxicillin-clavulanate (AUGMENTIN) 875-125 MG tablet Take 1 tablet by mouth every 12 (twelve) hours. 05/09/21  Yes Cheryll Cockayne, MD  ?alendronate (FOSAMAX) 70 MG tablet Take 1 tablet (70 mg total) by mouth every 7 (seven) days. 10/17/20   Sheliah Hatch, MD  ?aspirin 81 MG EC tablet Take by mouth.    [provider]  ?atorvastatin (LIPITOR) 40 MG tablet Take 40 mg by mouth daily. 04/12/19   [provider]  ?Cholecalciferol 50 MCG (2000 UT) CAPS TAKE 2 CAPSULES BY MOUTH EVERY DAY 09/02/15   [provider]  ?levothyroxine (SYNTHROID) 75 MCG tablet TAKE 1 TABLET BY MOUTH EVERY DAY 01/15/21   Sheliah Hatch, MD  ?loratadine (CLARITIN) 10 MG tablet Take by mouth.    [provider]  ?meloxicam (MOBIC) 7.5 MG tablet TAKE 1 TABLET BY MOUTH EVERY DAY 12/05/20   Janeece Agee, NP  ?methocarbamol (ROBAXIN) 500 MG tablet Take 0.5-1 tablets (250-500 mg total) by mouth 2 (two) times daily as needed for muscle spasms. 11/08/20   Janeece Agee, NP  ?omeprazole (PRILOSEC) 40 MG capsule TAKE 1 CAPSULE BY MOUTH EVERY DAY 12/20/20   Sheliah Hatch, MD  ?oxybutynin (DITROPAN-XL) 10 MG 24  hr tablet TAKE 1 TABLET BY MOUTH EVERY DAY 01/15/21   Sheliah Hatch, MD  ?sertraline (ZOLOFT) 25 MG tablet TAKE 1 TABLET (25 MG TOTAL) BY MOUTH DAILY. 02/12/21   Sheliah Hatch, MD  ?   ? ?Allergies    ?Patient has no known allergies.   ? ?Review of Systems   ?Review of Systems  ?Constitutional:  Negative for fever.  ?HENT:  Negative for ear pain.   ?Eyes:  Negative for pain.  ?Respiratory:  Negative for cough.   ?Cardiovascular:  Negative for chest pain.  ?Gastrointestinal:  Negative for abdominal pain.  ?Genitourinary:  Negative for flank pain.  ?Musculoskeletal:  Negative for back pain.  ?Skin:  Positive for wound. Negative for rash.  ?Neurological:  Negative for headaches.  ? ?Physical Exam ?Updated Vital Signs ?BP (!) 160/87   Pulse 78   Temp 98.4 ?F (36.9 ?C) (Oral)   Resp 16   SpO2 99%  ?Physical Exam ?Constitutional:   ?   General: She is not in acute distress. ?   Appearance: Normal appearance.  ?HENT:  ?   Head: Normocephalic.  ?   Nose: Nose normal.  ?Eyes:  ?   Extraocular Movements: Extraocular movements intact.  ?Cardiovascular:  ?   Rate and Rhythm: Normal rate.  ?Pulmonary:  ?   Effort: Pulmonary effort is normal.  ?Musculoskeletal:     ?  General: Normal range of motion.  ?   Cervical back: Normal range of motion.  ?Skin: ?   Comments: Dorsum of the right distal forearm with a 6 cm curvilinear laceration.  Otherwise intact flexion extension of the wrist and bilateral upper extremities radial ulnar median nerve intact.  Distal radial pulses 2+ intact.  Neurovascular intact upper extremities normal compartments.  ?Neurological:  ?   General: No focal deficit present.  ?   Mental Status: She is alert. Mental status is at baseline.  ? ? ?ED Results / Procedures / Treatments   ?Labs ?(all labs ordered are listed, but only abnormal results are displayed) ?Labs Reviewed - No data to display ? ?EKG ?None ? ?Radiology ?DG Forearm Right ? ?Result Date: 05/09/2021 ?CLINICAL DATA:  Dog bite to the  distal forearm EXAM: RIGHT FOREARM - 2 VIEW COMPARISON:  None. FINDINGS: Soft tissue swelling and deformity in the region. No evidence of fracture or radiopaque foreign object. IMPRESSION: No fracture or radiopaque foreign object. Soft tissue swelling/deformity in the region of the dog bite Electronically Signed   By: Paulina Fusi M.D.   On: 05/09/2021 14:41   ? ?Procedures ?Marland Kitchen.Laceration Repair ? ?Date/Time: 05/09/2021 5:06 PM ?Performed by: Cheryll Cockayne, MD ?Authorized by: Cheryll Cockayne, MD  ? ?Repair type:  ?  Repair type:  Complex ?Comments:  ?   6 cm curvilinear flap wound of the right distal forearm.  Lidocaine 1% no epinephrine total of 2 cc instilled.  Wound thoroughly irrigated with 500 cc of sterile water with half a capful of Betadine under pressure.  No foreign body seen in a bloodless field. ? ?Wound repair was complex given the frailty of her skin and macerated tissue.  Macerated tissue was debrided with iris scissors, some undermining of the fat underneath the flap performed.  Requiring suture support with Steri-Strips.  Wound edges approximated with Steri-Strip reinforced 4-0 Ethilon sutures.  Patient tolerated procedure well.  ? ? ?Medications Ordered in ED ?Medications  ?lidocaine-EPINEPHrine (XYLOCAINE W/EPI) 2 %-1:200000 (PF) injection (has no administration in time range)  ?lidocaine (PF) (XYLOCAINE) 1 % injection 5 mL (has no administration in time range)  ?acetaminophen (TYLENOL) tablet 1,000 mg (1,000 mg Oral Given 05/09/21 1559)  ? ? ?ED Course/ Medical Decision Making/ A&P ?  ?                        ?Medical Decision Making ?Amount and/or Complexity of Data Reviewed ?Radiology: ordered. ? ?Risk ?OTC drugs. ?Prescription drug management. ? ? ?Discussed with patient and her daughter at bedside regarding her history. ? ?Discussed risks of closure of animal bite and possible infection.  However her wound is gaping and large. ? ? ? ? ? ? ? ? ? ?Final Clinical Impression(s) / ED Diagnoses ?Final  diagnoses:  ?Dog bite, initial encounter  ?Laceration of right forearm, initial encounter  ? ? ?Rx / DC Orders ?ED Discharge Orders   ? ?      Ordered  ?  amoxicillin-clavulanate (AUGMENTIN) 875-125 MG tablet  Every 12 hours       ? 05/09/21 1708  ? ?  ?  ? ?  ? ? ?  ?Cheryll Cockayne, MD ?05/09/21 1709 ? ?

## 2021-05-09 NOTE — ED Notes (Signed)
EMT-P provided AVS using Teachback Method. Patient verbalizes understanding of Discharge Instructions. Opportunity for Questioning and Answers were provided by EMT-P. Patient Discharged from ED.  ? ?

## 2021-05-09 NOTE — Discharge Instructions (Signed)
Keep the wound dry for 2 days.  After 2 days you may wash gently with soap and water.  Change dressing daily afterwards.  Applied Neosporin ointment and keep the wound covered. ? ?Return in 10 days to remove your sutures. ? ?However return immediately if you see signs of redness fever purulent discharge worsening pain or any additional concerns. ?

## 2021-05-09 NOTE — ED Notes (Signed)
Pt has dog bite right wrist measuring 4 cm x 3cm, red wound bed, cleansed and bleeding controlled.  ?

## 2021-05-09 NOTE — ED Triage Notes (Signed)
Pts small dog was asleep, pt put hand on dog and she was bitten on right arm.  ?

## 2021-05-18 ENCOUNTER — Encounter: Payer: Self-pay | Admitting: Family Medicine

## 2021-05-18 ENCOUNTER — Ambulatory Visit (INDEPENDENT_AMBULATORY_CARE_PROVIDER_SITE_OTHER): Payer: Medicare HMO | Admitting: Family Medicine

## 2021-05-18 VITALS — BP 124/62 | HR 75 | Temp 98.2°F | Resp 17 | Ht 63.0 in | Wt 145.6 lb

## 2021-05-18 DIAGNOSIS — S41151D Open bite of right upper arm, subsequent encounter: Secondary | ICD-10-CM | POA: Diagnosis not present

## 2021-05-18 DIAGNOSIS — S51801D Unspecified open wound of right forearm, subsequent encounter: Secondary | ICD-10-CM | POA: Diagnosis not present

## 2021-05-18 DIAGNOSIS — W540XXD Bitten by dog, subsequent encounter: Secondary | ICD-10-CM | POA: Diagnosis not present

## 2021-05-18 MED ORDER — AMOXICILLIN-POT CLAVULANATE 875-125 MG PO TABS: 1.0000 | ORAL_TABLET | Freq: Two times a day (BID) | ORAL | 0 refills | Status: DC

## 2021-05-18 NOTE — Progress Notes (Signed)
? ?Subjective:  ?Patient ID: Sabrina Castaneda, female    DOB: 07/29/1935  Age: 86 y.o. MRN: [id number] ? ?CC:  ?Chief Complaint  ?Patient presents with  ? Suture / Staple Removal  ?  Pt has several sutures in her Rt wrist, no concerns with site, doing well  ?Number of stitches not documented in ED note ?Notes has been wrapped entirety of healing, some redness in the area and some exudate present around stitches   ? ? ?HPI ?Sabrina Castaneda presents for  ? ?Dog bite, laceration of right forearm ?Here for wound check and suture removal.  ?ER note reviewed from 05/09/2021.  Date of injury 05/09/2021 ?Her dog was startled after being awoken and bit her on the forearm.  Animals vaccines are up-to-date.  Tetanus was up-to-date on patient.  No other injuries. ?Per ER note laceration was 6 cm, curvilinear.  Muscular function intact, pulses and distal nerve function intact.  Wound repair was complex given frailty of skin and macerated tissue.  Macerated tissue was debrided with iris scissors, some undermining of fat underneath the flap performed.  Suture support with Steri-Strips and wound edges approximated with Steri-Strips reinforced with 4-0 Ethilon sutures.  Treated with Augmentin 875 mg twice daily for 1 week. ? ?Finished abx.  No side effects with abx, no diarrhea.  ?No pain.  ?Changing bandage daily, cleaing with soap/water and cover with telfa. Slight oozing, slight swelling, no fever. Unknown if more swollen off abx, no new redness.  ? ?Unfortunately will have to put dog down tomorrow as becoming more aggressive. Has been at vet since injury.  ? ?History ?Patient Active Problem List  ? Diagnosis Date Noted  ? Depression 03/14/2020  ? Overweight (BMI 25.0-29.9) 03/14/2020  ? Impingement syndrome of right shoulder region 03/01/2020  ? Physical exam 09/10/2019  ? Hyperlipidemia 08/16/2019  ? Hypothyroid 08/16/2019  ? Diplopia 03/09/2012  ? Female stress incontinence 12/05/2010  ? ?Past Medical History:  ?Diagnosis Date  ?  Allergy   ? GERD (gastroesophageal reflux disease)   ? History of chicken pox   ? Hyperlipidemia   ? Scoliosis   ? Thyroid disease   ? ?Past Surgical History:  ?Procedure Laterality Date  ? ABDOMINAL HYSTERECTOMY  1980  ? ADENOIDECTOMY    ? HERNIA REPAIR  02/04/1966  ? REPLACEMENT TOTAL KNEE Right   ? TONSILLECTOMY    ? WRIST FRACTURE SURGERY Bilateral   ? ?No Known Allergies ?Prior to Admission medications   ?Medication Sig Start Date End Date Taking? Authorizing Provider  ?alendronate (FOSAMAX) 70 MG tablet Take 1 tablet (70 mg total) by mouth every 7 (seven) days. 10/17/20  Yes Midge Minium, MD  ?aspirin 81 MG EC tablet Take by mouth.   Yes [provider]  ?atorvastatin (LIPITOR) 40 MG tablet Take 40 mg by mouth daily. 04/12/19  Yes [provider]  ?Cholecalciferol 50 MCG (2000 UT) CAPS TAKE 2 CAPSULES BY MOUTH EVERY DAY 09/02/15  Yes [provider]  ?levothyroxine (SYNTHROID) 75 MCG tablet TAKE 1 TABLET BY MOUTH EVERY DAY 01/15/21  Yes Midge Minium, MD  ?loratadine (CLARITIN) 10 MG tablet Take by mouth.   Yes [provider]  ?meloxicam (MOBIC) 7.5 MG tablet TAKE 1 TABLET BY MOUTH EVERY DAY 12/05/20  Yes Maximiano Coss, NP  ?methocarbamol (ROBAXIN) 500 MG tablet Take 0.5-1 tablets (250-500 mg total) by mouth 2 (two) times daily as needed for muscle spasms. 11/08/20  Yes Maximiano Coss, NP  ?omeprazole (PRILOSEC) 40  MG capsule TAKE 1 CAPSULE BY MOUTH EVERY DAY 12/20/20  Yes Midge Minium, MD  ?oxybutynin (DITROPAN-XL) 10 MG 24 hr tablet TAKE 1 TABLET BY MOUTH EVERY DAY 01/15/21  Yes Midge Minium, MD  ?sertraline (ZOLOFT) 25 MG tablet TAKE 1 TABLET (25 MG TOTAL) BY MOUTH DAILY. 02/12/21  Yes Midge Minium, MD  ? ?Social History  ? ?Socioeconomic History  ? Marital status: Widowed  ?  Spouse name: Not on file  ? Number of children: Not on file  ? Years of education: Not on file  ? Highest education level: Not on file  ?Occupational History  ? Not on  file  ?Tobacco Use  ? Smoking status: Never  ? Smokeless tobacco: Never  ?Vaping Use  ? Vaping Use: Never used  ?Substance and Sexual Activity  ? Alcohol use: Yes  ?  Comment: occasionally  ? Drug use: Never  ? Sexual activity: Not Currently  ?Other Topics Concern  ? Not on file  ?Social History Narrative  ? Not on file  ? ?Social Determinants of Health  ? ?Financial Resource Strain: Low Risk   ? Difficulty of Paying Living Expenses: Not hard at all  ?Food Insecurity: No Food Insecurity  ? Worried About Charity fundraiser in the Last Year: Never true  ? Ran Out of Food in the Last Year: Never true  ?Transportation Needs: No Transportation Needs  ? Lack of Transportation (Medical): No  ? Lack of Transportation (Non-Medical): No  ?Physical Activity: Inactive  ? Days of Exercise per Week: 0 days  ? Minutes of Exercise per Session: 0 min  ?Stress: No Stress Concern Present  ? Feeling of Stress : Not at all  ?Social Connections: Socially Isolated  ? Frequency of Communication with Friends and Family: More than three times a week  ? Frequency of Social Gatherings with Friends and Family: More than three times a week  ? Attends Religious Services: Never  ? Active Member of Clubs or Organizations: No  ? Attends Archivist Meetings: Never  ? Marital Status: Widowed  ?Intimate Partner Violence: Not At Risk  ? Fear of Current or Ex-Partner: No  ? Emotionally Abused: No  ? Physically Abused: No  ? Sexually Abused: No  ? ? ?Review of Systems ?Per HPI.  ? ?Objective:  ? ?Vitals:  ? 05/18/21 1134  ?BP: 124/62  ?Pulse: 75  ?Resp: 17  ?Temp: 98.2 ?F (36.8 ?C)  ?TempSrc: Temporal  ?SpO2: 96%  ?Weight: 145 lb 9.6 oz (66 kg)  ?Height: 5\' 3"  (1.6 m)  ? ? ? ?Physical Exam ?Vitals reviewed.  ?Constitutional:   ?   General: She is not in acute distress. ?   Appearance: Normal appearance. She is well-developed.  ?HENT:  ?   Head: Normocephalic and atraumatic.  ?Cardiovascular:  ?   Rate and Rhythm: Normal rate.  ?Pulmonary:  ?    Effort: Pulmonary effort is normal.  ?Skin: ?   Comments: Wound of right forearm, see photos initially, after removal of sutures and some wet appearing tissue, and after Steri-Strips applied.  Initially 7 sutures in place with Steri-Strips intermingled.  Steri-Strips removed, cut free from sutures. ?Proximal aspect of wound healing well, proximal suture removed.  2 lower sutures still in place, some opening of wound with tension, sutures were left in place.  Lower sutures had pulled through, attached only 1 site of wound, removed.  Lower wound does widen with tension, base of wound seen without muscle,  bone or fatty tissue appreciated.  No appreciable bleeding or exudate expressed with pressure.  Some soft tissue swelling proximal to wound without focal tenderness.  No bony tenderness.  ?Neurological:  ?   Mental Status: She is alert and oriented to person, place, and time.  ?Psychiatric:     ?   Mood and Affect: Mood normal.  ? ? ? ? ? ? ?37 minutes spent during visit, including chart review, counseling and assimilation of information, exam,exploration and treatment of wound as above,  discussion of plan, and chart completion.  ? ? ?Assessment & Plan:  ?Sabrina Castaneda is a 86 y.o. female . ?Open wound of right forearm, subsequent encounter - Plan: amoxicillin-clavulanate (AUGMENTIN) 875-125 MG tablet ? ?Dog bite of right upper extremity, subsequent encounter - Plan: amoxicillin-clavulanate (AUGMENTIN) 875-125 MG tablet ?Complicated wound as above, unfortunately some sutures that have pulled through.  Will need to heal by secondary intention.  Some soft tissue swelling, wet appearing exudate versus eschar.  Cover for additional 5 days with Augmentin as initial dog bite.  Steri-Strips applied to lower aspect of wound, partial suture removal as above.  Recheck 3 days.  ER/urgent care precautions given ? ?No orders of the defined types were placed in this encounter. ? ?There are no Patient Instructions on file for this  visit. ? ? ? ?Signed,  ? ?Merri Ray, MD ?[place], [facility] ?[facility] ?05/18/21 ?11:55 AM ? ? ?

## 2021-05-18 NOTE — Patient Instructions (Signed)
Keep wound clean and covered.  A few of the sutures were removed today as they had pulled through.  There is still an open wound on the lower portion of the wound.  Leave Steri-Strips intact.  That will need more time to heal from below.  Recheck in 3 days -we may be able to remove other sutures at that time.  Restart antibiotic for the next 5 days to cover for infection.  If any increasing redness, increasing pain or discharge to be seen over the weekend.  I do not expect that to occur. Take care.  ?

## 2021-05-21 ENCOUNTER — Ambulatory Visit (INDEPENDENT_AMBULATORY_CARE_PROVIDER_SITE_OTHER): Payer: Medicare HMO | Admitting: Family Medicine

## 2021-05-21 ENCOUNTER — Encounter: Payer: Self-pay | Admitting: Family Medicine

## 2021-05-21 VITALS — BP 116/70 | HR 78 | Temp 98.0°F | Resp 17 | Ht 63.0 in | Wt 154.0 lb

## 2021-05-21 DIAGNOSIS — W540XXD Bitten by dog, subsequent encounter: Secondary | ICD-10-CM

## 2021-05-21 DIAGNOSIS — S51801D Unspecified open wound of right forearm, subsequent encounter: Secondary | ICD-10-CM | POA: Diagnosis not present

## 2021-05-21 DIAGNOSIS — S41151D Open bite of right upper arm, subsequent encounter: Secondary | ICD-10-CM | POA: Diagnosis not present

## 2021-05-21 NOTE — Progress Notes (Signed)
? ?Subjective:  ?Patient ID: Sabrina Castaneda, female    DOB: 1935/04/14  Age: 86 y.o. MRN: [id number] ? ?CC:  ?Chief Complaint  ?Patient presents with  ? Wound Check  ?  Pt here for recheck from firday, notes has been changing bandage and notes it is doing okay, still some discharge from wound at this time   ? ? ?HPI ?Sabrina Castaneda presents for  ? ?Forearm wound, dog bite: ?Follow-up from April 14 visit.  Initial injury 4/23 with sutures and Steri-Strips placed for complicated wound at that time in the ER.  She had completed the initial Augmentin.  Some induration, slight oozing from wound appreciated last visit.  Few sutures that had pulled through wound were removed.  Steri-Strips removed and replaced.  Lower aspect and mid aspect of wound with slight widening.  Secondary intention healing discussed.  Additional 5 days of Augmentin provided.  Here for recheck ? ?Still some d/c from wound. ?Taking augmentin, no diarrhea, or side effects.  ?No fever.  ?Swollen area improving.  ?No redness/streaks.  ?No topical ointments. ?No pain.  ?Doing ok - dog was put to sleep 2 mornings ago.   ? ? ?History ?Patient Active Problem List  ? Diagnosis Date Noted  ? Depression 03/14/2020  ? Overweight (BMI 25.0-29.9) 03/14/2020  ? Impingement syndrome of right shoulder region 03/01/2020  ? Physical exam 09/10/2019  ? Hyperlipidemia 08/16/2019  ? Hypothyroid 08/16/2019  ? Diplopia 03/09/2012  ? Female stress incontinence 12/05/2010  ? ?Past Medical History:  ?Diagnosis Date  ? Allergy   ? GERD (gastroesophageal reflux disease)   ? History of chicken pox   ? Hyperlipidemia   ? Scoliosis   ? Thyroid disease   ? ?Past Surgical History:  ?Procedure Laterality Date  ? ABDOMINAL HYSTERECTOMY  1980  ? ADENOIDECTOMY    ? HERNIA REPAIR  02/04/1966  ? REPLACEMENT TOTAL KNEE Right   ? TONSILLECTOMY    ? WRIST FRACTURE SURGERY Bilateral   ? ?No Known Allergies ?Prior to Admission medications   ?Medication Sig Start Date End Date Taking? Authorizing  Provider  ?alendronate (FOSAMAX) 70 MG tablet Take 1 tablet (70 mg total) by mouth every 7 (seven) days. 10/17/20   Sheliah Hatch, MD  ?amoxicillin-clavulanate (AUGMENTIN) 875-125 MG tablet Take 1 tablet by mouth 2 (two) times daily. 05/18/21   Shade Flood, MD  ?aspirin 81 MG EC tablet Take by mouth.    [provider]  ?atorvastatin (LIPITOR) 40 MG tablet Take 40 mg by mouth daily. 04/12/19   [provider]  ?Cholecalciferol 50 MCG (2000 UT) CAPS TAKE 2 CAPSULES BY MOUTH EVERY DAY 09/02/15   [provider]  ?levothyroxine (SYNTHROID) 75 MCG tablet TAKE 1 TABLET BY MOUTH EVERY DAY 01/15/21   Sheliah Hatch, MD  ?loratadine (CLARITIN) 10 MG tablet Take by mouth.    [provider]  ?meloxicam (MOBIC) 7.5 MG tablet TAKE 1 TABLET BY MOUTH EVERY DAY 12/05/20   Janeece Agee, NP  ?methocarbamol (ROBAXIN) 500 MG tablet Take 0.5-1 tablets (250-500 mg total) by mouth 2 (two) times daily as needed for muscle spasms. 11/08/20   Janeece Agee, NP  ?omeprazole (PRILOSEC) 40 MG capsule TAKE 1 CAPSULE BY MOUTH EVERY DAY 12/20/20   Sheliah Hatch, MD  ?oxybutynin (DITROPAN-XL) 10 MG 24 hr tablet TAKE 1 TABLET BY MOUTH EVERY DAY 01/15/21   Sheliah Hatch, MD  ?sertraline (ZOLOFT) 25 MG tablet TAKE 1 TABLET (25 MG TOTAL) BY MOUTH  DAILY. 02/12/21   Sheliah Hatchabori, Katherine E, MD  ? ?Social History  ? ?Socioeconomic History  ? Marital status: Widowed  ?  Spouse name: Not on file  ? Number of children: Not on file  ? Years of education: Not on file  ? Highest education level: Not on file  ?Occupational History  ? Not on file  ?Tobacco Use  ? Smoking status: Never  ? Smokeless tobacco: Never  ?Vaping Use  ? Vaping Use: Never used  ?Substance and Sexual Activity  ? Alcohol use: Yes  ?  Comment: occasionally  ? Drug use: Never  ? Sexual activity: Not Currently  ?Other Topics Concern  ? Not on file  ?Social History Narrative  ? Not on file  ? ?Social Determinants of Health  ? ?Financial  Resource Strain: Low Risk   ? Difficulty of Paying Living Expenses: Not hard at all  ?Food Insecurity: No Food Insecurity  ? Worried About Programme researcher, broadcasting/film/videounning Out of Food in the Last Year: Never true  ? Ran Out of Food in the Last Year: Never true  ?Transportation Needs: No Transportation Needs  ? Lack of Transportation (Medical): No  ? Lack of Transportation (Non-Medical): No  ?Physical Activity: Inactive  ? Days of Exercise per Week: 0 days  ? Minutes of Exercise per Session: 0 min  ?Stress: No Stress Concern Present  ? Feeling of Stress : Not at all  ?Social Connections: Socially Isolated  ? Frequency of Communication with Friends and Family: More than three times a week  ? Frequency of Social Gatherings with Friends and Family: More than three times a week  ? Attends Religious Services: Never  ? Active Member of Clubs or Organizations: No  ? Attends BankerClub or Organization Meetings: Never  ? Marital Status: Widowed  ?Intimate Partner Violence: Not At Risk  ? Fear of Current or Ex-Partner: No  ? Emotionally Abused: No  ? Physically Abused: No  ? Sexually Abused: No  ? ? ?Review of Systems ? ? ?Objective:  ? ?Vitals:  ? 05/21/21 1119  ?BP: 116/70  ?Pulse: 78  ?Resp: 17  ?Temp: 98 ?F (36.7 ?C)  ?TempSrc: Temporal  ?SpO2: 96%  ?Weight: 154 lb (69.9 kg)  ?Height: 5\' 3"  (1.6 m)  ? ? ? ?Physical Exam ?Constitutional:   ?   General: She is not in acute distress. ?   Appearance: Normal appearance. She is well-developed.  ?HENT:  ?   Head: Normocephalic and atraumatic.  ?Cardiovascular:  ?   Rate and Rhythm: Normal rate.  ?Pulmonary:  ?   Effort: Pulmonary effort is normal.  ?Skin: ?   Comments: R forearm wound - 3 sutures remaining, removed, min clear d/c with slight widening of lower horizontal wound, proximal wound healing. See photo.   ?Neurological:  ?   Mental Status: She is alert and oriented to person, place, and time.  ?Psychiatric:     ?   Mood and Affect: Mood normal.  ? ? ? ? ? ? ? ? ?Assessment & Plan:  ?Sabrina FlashHelen H Castaneda is a  86 y.o. female . ?Dog bite of right upper extremity, subsequent encounter ? ?Open wound of right forearm, subsequent encounter ?Proximal aspect of wound healing.  Still minimal clear discharge at lower wound but appears to be coming together, less widening with tension.  Remaining sutures removed.  Steri-Strips replaced x3.  Wound care discussed with keeping clean, covered but allow air to area at home at times during the day.  Soap and  water over area but no scrubbing, recheck 1 week, sooner if worse. ? ?No orders of the defined types were placed in this encounter. ? ?There are no Patient Instructions on file for this visit. ? ? ? ?Signed,  ? ?Meredith Staggers, MD ?[facility], [facility] ? [facility] ?05/21/21 ?12:34 PM ? ? ?

## 2021-05-23 ENCOUNTER — Other Ambulatory Visit: Payer: Self-pay | Admitting: Family Medicine

## 2021-05-28 ENCOUNTER — Encounter: Payer: Self-pay | Admitting: Family Medicine

## 2021-05-28 ENCOUNTER — Ambulatory Visit (INDEPENDENT_AMBULATORY_CARE_PROVIDER_SITE_OTHER): Payer: Medicare HMO | Admitting: Family Medicine

## 2021-05-28 VITALS — BP 128/66 | HR 97 | Temp 98.2°F | Resp 16 | Wt 143.6 lb

## 2021-05-28 DIAGNOSIS — S51801D Unspecified open wound of right forearm, subsequent encounter: Secondary | ICD-10-CM | POA: Diagnosis not present

## 2021-05-28 DIAGNOSIS — W540XXD Bitten by dog, subsequent encounter: Secondary | ICD-10-CM | POA: Diagnosis not present

## 2021-05-28 DIAGNOSIS — S41151D Open bite of right upper arm, subsequent encounter: Secondary | ICD-10-CM

## 2021-05-28 NOTE — Progress Notes (Signed)
? ?Subjective:  ?Patient ID: Sabrina Castaneda, female    DOB: November 12, 1935  Age: 86 y.o. MRN: [id number] ? ?CC:  ?Chief Complaint  ?Patient presents with  ? Wound Check  ?  Pt is here for a F/U on wound . Pt notes wound is still draining , redness and swelling to area.   ? ? ?HPI ?Sabrina Castaneda presents for  ? ?Dog bite, wound of right forearm ?Initial date of injury 05/09/2021, see last office visit April 14, then April 17.  Initially on the 14th partial suture removal, Steri-Strips applied.  Some widening of wound at that time as previous sutures pulled through, healing by secondary intention discussed.  Treated initially with 5 additional days of antibiotics.  ? Improved on last visit, remaining sutures removed and Steri-Strips replaced.  Some discharge noted from wound at that time but thought to be healing.  Swelling underneath the wound was improving as well.  Wound care discussed. ? ?Today: swelling has improved. Still some discharge- about the same. Steri strips off past few days. Rinsing with soap/water at least once per day.  ?No fever or pain. No redness.  ? ? ?History ?Patient Active Problem List  ? Diagnosis Date Noted  ? Depression 03/14/2020  ? Overweight (BMI 25.0-29.9) 03/14/2020  ? Impingement syndrome of right shoulder region 03/01/2020  ? Physical exam 09/10/2019  ? Hyperlipidemia 08/16/2019  ? Hypothyroid 08/16/2019  ? Diplopia 03/09/2012  ? Female stress incontinence 12/05/2010  ? ?Past Medical History:  ?Diagnosis Date  ? Allergy   ? GERD (gastroesophageal reflux disease)   ? History of chicken pox   ? Hyperlipidemia   ? Scoliosis   ? Thyroid disease   ? ?Past Surgical History:  ?Procedure Laterality Date  ? ABDOMINAL HYSTERECTOMY  1980  ? ADENOIDECTOMY    ? HERNIA REPAIR  02/04/1966  ? REPLACEMENT TOTAL KNEE Right   ? TONSILLECTOMY    ? WRIST FRACTURE SURGERY Bilateral   ? ?No Known Allergies ?Prior to Admission medications   ?Medication Sig Start Date End Date Taking? Authorizing Provider   ?alendronate (FOSAMAX) 70 MG tablet Take 1 tablet (70 mg total) by mouth every 7 (seven) days. 10/17/20  Yes Sheliah Hatch, MD  ?aspirin 81 MG EC tablet Take by mouth.   Yes [provider]  ?atorvastatin (LIPITOR) 40 MG tablet Take 40 mg by mouth daily. 04/12/19  Yes [provider]  ?Cholecalciferol 50 MCG (2000 UT) CAPS TAKE 2 CAPSULES BY MOUTH EVERY DAY 09/02/15  Yes [provider]  ?levothyroxine (SYNTHROID) 75 MCG tablet TAKE 1 TABLET BY MOUTH EVERY DAY 01/15/21  Yes Sheliah Hatch, MD  ?loratadine (CLARITIN) 10 MG tablet Take by mouth.   Yes [provider]  ?omeprazole (PRILOSEC) 40 MG capsule TAKE 1 CAPSULE BY MOUTH EVERY DAY 12/20/20  Yes Sheliah Hatch, MD  ?oxybutynin (DITROPAN-XL) 10 MG 24 hr tablet TAKE 1 TABLET BY MOUTH EVERY DAY 01/15/21  Yes Sheliah Hatch, MD  ?sertraline (ZOLOFT) 25 MG tablet TAKE 1 TABLET (25 MG TOTAL) BY MOUTH DAILY. 05/23/21  Yes Sheliah Hatch, MD  ?amoxicillin-clavulanate (AUGMENTIN) 875-125 MG tablet Take 1 tablet by mouth 2 (two) times daily. ?Patient not taking: Reported on 05/28/2021 05/18/21   Shade Flood, MD  ?meloxicam (MOBIC) 7.5 MG tablet TAKE 1 TABLET BY MOUTH EVERY DAY ?Patient not taking: Reported on 05/28/2021 12/05/20   Janeece Agee, NP  ?methocarbamol (ROBAXIN) 500 MG tablet Take 0.5-1 tablets (250-500 mg total) by  mouth 2 (two) times daily as needed for muscle spasms. ?Patient not taking: Reported on 05/28/2021 11/08/20   Janeece Agee, NP  ? ?Social History  ? ?Socioeconomic History  ? Marital status: Widowed  ?  Spouse name: Not on file  ? Number of children: Not on file  ? Years of education: Not on file  ? Highest education level: Not on file  ?Occupational History  ? Not on file  ?Tobacco Use  ? Smoking status: Never  ? Smokeless tobacco: Never  ?Vaping Use  ? Vaping Use: Never used  ?Substance and Sexual Activity  ? Alcohol use: Yes  ?  Comment: occasionally  ? Drug use: Never  ? Sexual  activity: Not Currently  ?Other Topics Concern  ? Not on file  ?Social History Narrative  ? Not on file  ? ?Social Determinants of Health  ? ?Financial Resource Strain: Low Risk   ? Difficulty of Paying Living Expenses: Not hard at all  ?Food Insecurity: No Food Insecurity  ? Worried About Programme researcher, broadcasting/film/video in the Last Year: Never true  ? Ran Out of Food in the Last Year: Never true  ?Transportation Needs: No Transportation Needs  ? Lack of Transportation (Medical): No  ? Lack of Transportation (Non-Medical): No  ?Physical Activity: Inactive  ? Days of Exercise per Week: 0 days  ? Minutes of Exercise per Session: 0 min  ?Stress: No Stress Concern Present  ? Feeling of Stress : Not at all  ?Social Connections: Socially Isolated  ? Frequency of Communication with Friends and Family: More than three times a week  ? Frequency of Social Gatherings with Friends and Family: More than three times a week  ? Attends Religious Services: Never  ? Active Member of Clubs or Organizations: No  ? Attends Banker Meetings: Never  ? Marital Status: Widowed  ?Intimate Partner Violence: Not At Risk  ? Fear of Current or Ex-Partner: No  ? Emotionally Abused: No  ? Physically Abused: No  ? Sexually Abused: No  ? ? ?Review of Systems ?Per HPI.  ? ?Objective:  ? ?Vitals:  ? 05/28/21 0856  ?BP: 128/66  ?Pulse: 97  ?Resp: 16  ?Temp: 98.2 ?F (36.8 ?C)  ?TempSrc: Temporal  ?SpO2: 98%  ?Weight: 143 lb 9.6 oz (65.1 kg)  ? ? ? ?Physical Exam ?Constitutional:   ?   General: She is not in acute distress. ?   Appearance: Normal appearance. She is well-developed.  ?HENT:  ?   Head: Normocephalic and atraumatic.  ?Cardiovascular:  ?   Rate and Rhythm: Normal rate.  ?Pulmonary:  ?   Effort: Pulmonary effort is normal.  ?Neurological:  ?   Mental Status: She is alert and oriented to person, place, and time.  ?Psychiatric:     ?   Mood and Affect: Mood normal.  ? ? ? ? ? ? ?Assessment & Plan:  ?Sabrina Castaneda is a 86 y.o. female . ?Dog bite  of right upper extremity, subsequent encounter ? ?Open wound of right forearm, subsequent encounter ?Upper portion of wound healing well.  Lower mid wound still slight opening, but I do not see any true exudate, and minimal widening only centrally with tension.  Wet appearance on healing skin, eschar.  See photo.  1 additional Steri-Strip applied.  Continue cleansing with soap and water, has follow-up with PCP in 4 days.  RTC precautions if any new or worsening symptoms during that time. ? ?No orders of the  defined types were placed in this encounter. ? ?Patient Instructions  ?Thank you for coming back in to see us today.  I do think the wound is healing.  I do not see any sign of infection at this time.  I am also encouraged by the swelling underneath the wound that has also improved.  Okay to keep a Steri-Strip over that area for this week until you are seen in follow-up with Dr. Beverely Lowabori.  I expect that wound to continue to heal this week and should be closing up within the next week to 10 days at the most.  Let me know if you have any questions or if there are any new or worsening symptoms such as redness or increasing pain.  Take care.  ? ? ? ?Signed,  ? ?Meredith StaggersJeffrey Cyprus Kuang, MD ?[facility], [facility] ?05/28/21 ?9:37 AM ? ? ?

## 2021-05-28 NOTE — Patient Instructions (Signed)
Thank you for coming back in to see [place] today.  I do think the wound is healing.  I do not see any sign of infection at this time.  I am also encouraged by the swelling underneath the wound that has also improved.  Okay to keep a Steri-Strip over that area for this week until you are seen in follow-up with Dr. Beverely Low.  I expect that wound to continue to heal this week and should be closing up within the next week to 10 days at the most.  Let me know if you have any questions or if there are any new or worsening symptoms such as redness or increasing pain.  Take care.  ?

## 2021-06-01 ENCOUNTER — Ambulatory Visit (INDEPENDENT_AMBULATORY_CARE_PROVIDER_SITE_OTHER): Payer: Medicare HMO | Admitting: Family Medicine

## 2021-06-01 ENCOUNTER — Encounter: Payer: Self-pay | Admitting: Family Medicine

## 2021-06-01 VITALS — BP 110/66 | HR 76 | Temp 97.7°F | Resp 18 | Ht 63.0 in | Wt 144.0 lb

## 2021-06-01 DIAGNOSIS — Z0000 Encounter for general adult medical examination without abnormal findings: Secondary | ICD-10-CM

## 2021-06-01 DIAGNOSIS — E039 Hypothyroidism, unspecified: Secondary | ICD-10-CM | POA: Diagnosis not present

## 2021-06-01 DIAGNOSIS — G8929 Other chronic pain: Secondary | ICD-10-CM | POA: Diagnosis not present

## 2021-06-01 DIAGNOSIS — M25562 Pain in left knee: Secondary | ICD-10-CM

## 2021-06-01 NOTE — Progress Notes (Signed)
? ?  Subjective:  ? ? Patient ID: Sabrina Castaneda, female    DOB: 10/06/1935, 86 y.o.   MRN: [id number] ? ?HPI ?CPE- UTD on Tdap, PNA.  No longer doing mammo or colonoscopy ? ?Health Maintenance  ?Topic Date Due  ?? COVID-19 Vaccine (4 - Booster for Moderna series) 06/03/2021 (Originally 02/16/2020)  ?? INFLUENZA VACCINE  09/04/2021  ?? TETANUS/TDAP  08/09/2030  ?? Pneumonia Vaccine 74+ Years old  Completed  ?? DEXA SCAN  Completed  ?? Zoster Vaccines- Shingrix  Completed  ?? HPV VACCINES  Aged Out  ?  ? ? ?Review of Systems ?Patient reports no vision/ hearing changes, adenopathy,fever, weight change,  persistant/recurrent hoarseness , swallowing issues, chest pain, palpitations, edema, persistant/recurrent cough, hemoptysis, dyspnea (rest/exertional/paroxysmal nocturnal), gastrointestinal bleeding (melena, rectal bleeding), abdominal pain, significant heartburn, bowel changes, GU symptoms (dysuria, hematuria, incontinence), Gyn symptoms (abnormal  bleeding, pain),  syncope, focal weakness, memory loss, numbness & tingling, skin/hair/nail changes, abnormal bruising or bleeding, anxiety, or depression.  ?   ?Objective:  ? Physical Exam ?General Appearance:    Alert, cooperative, no distress, appears stated age  ?Head:    Normocephalic, without obvious abnormality, atraumatic  ?Eyes:    PERRL, conjunctiva/corneas clear, EOM's intact both eyes  ?Ears:    Normal TM's and external ear canals, both ears  ?Nose:   Nares normal, septum midline, mucosa normal, no drainage  ?  or sinus tenderness  ?Throat:   Lips, mucosa, and tongue normal; teeth and gums normal  ?Neck:   Supple, symmetrical, trachea midline, no adenopathy;  ?  Thyroid: no enlargement/tenderness/nodules  ?Back:     Symmetric, no curvature, ROM normal, no CVA tenderness  ?Lungs:     Clear to auscultation bilaterally, respirations unlabored  ?Chest Wall:    No tenderness or deformity  ? Heart:    Regular rate and rhythm, S1 and S2 normal, no murmur, rub ?  or gallop   ?Breast Exam:    Deferred  ?Abdomen:     Soft, non-tender, bowel sounds active all four quadrants,  ?  no masses, no organomegaly  ?Genitalia:    Deferred  ?Rectal:    ?Extremities:   Extremities normal, atraumatic, no cyanosis or edema  ?Pulses:   2+ and symmetric all extremities  ?Skin:   Skin color, texture, turgor normal, no rashes.  Healing dog bite on R forearm  ?Lymph nodes:   Cervical, supraclavicular, and axillary nodes normal  ?Neurologic:   CNII-XII intact, normal strength, sensation and reflexes  ?  throughout  ?  ? ? ? ?   ?Assessment & Plan:  ? ? ?

## 2021-06-01 NOTE — Patient Instructions (Addendum)
Follow up in 6 months to recheck BP and thyroid ?We'll notify you of your lab results and make any changes if needed ?Keep up the good work on healthy diet and regular exercise- you look great!!! ?Keep wound clean and dry ?Call with any questions or concerns ?Stay Safe!  Stay Healthy! ?Hang In There!!! ? ? ?

## 2021-06-01 NOTE — Assessment & Plan Note (Signed)
Pt's PE WNL w/ exception of known scoliosis and healing dog bite on R forearm.  No longer doing mammo, DEXA, or colon cancer screening.  Check labs.  Anticipatory guidance provided.  ?

## 2021-06-01 NOTE — Assessment & Plan Note (Signed)
Currently asymptomatic.  Check labs.  Adjust meds prn  

## 2021-06-04 ENCOUNTER — Other Ambulatory Visit (INDEPENDENT_AMBULATORY_CARE_PROVIDER_SITE_OTHER): Payer: Medicare HMO

## 2021-06-04 DIAGNOSIS — E039 Hypothyroidism, unspecified: Secondary | ICD-10-CM

## 2021-06-04 LAB — BASIC METABOLIC PANEL
BUN: 15 mg/dL (ref 6–23)
CO2: 28 mEq/L (ref 19–32)
Calcium: 9 mg/dL (ref 8.4–10.5)
Chloride: 102 mEq/L (ref 96–112)
Creatinine, Ser: 0.7 mg/dL (ref 0.40–1.20)
GFR: 79.87 mL/min (ref >60.00)
Glucose, Bld: 90 mg/dL (ref 70–99)
Potassium: 4.1 mEq/L (ref 3.5–5.1)
Sodium: 135 mEq/L (ref 135–145)

## 2021-06-04 LAB — CBC WITH DIFFERENTIAL/PLATELET
Basophils Absolute: 0.1 10*3/uL (ref 0.0–0.1)
Basophils Relative: 1.5 % (ref 0.0–3.0)
Eosinophils Absolute: 0.2 10*3/uL (ref 0.0–0.7)
Eosinophils Relative: 2.5 % (ref 0.0–5.0)
HCT: 41.7 % (ref 36.0–46.0)
Hemoglobin: 13.7 g/dL (ref 12.0–15.0)
Lymphocytes Relative: 20.6 % (ref 12.0–46.0)
Lymphs Abs: 1.2 10*3/uL (ref 0.7–4.0)
MCHC: 32.8 g/dL (ref 30.0–36.0)
MCV: 88.1 fl (ref 78.0–100.0)
Monocytes Absolute: 0.7 10*3/uL (ref 0.1–1.0)
Monocytes Relative: 11 % (ref 3.0–12.0)
Neutro Abs: 3.9 10*3/uL (ref 1.4–7.7)
Neutrophils Relative %: 64.4 % (ref 43.0–77.0)
Platelets: 369 10*3/uL (ref 150.0–400.0)
RBC: 4.74 Mil/uL (ref 3.87–5.11)
RDW: 14.3 % (ref 11.5–15.5)
WBC: 6 10*3/uL (ref 4.0–10.5)

## 2021-06-04 LAB — TSH: TSH: 2.03 u[IU]/mL (ref 0.35–5.50)

## 2021-06-04 LAB — LIPID PANEL
Cholesterol: 170 mg/dL (ref 0–200)
HDL: 73.8 mg/dL (ref >39.00)
LDL Cholesterol: 78 mg/dL (ref 0–99)
NonHDL: 96.49
Total CHOL/HDL Ratio: 2
Triglycerides: 93 mg/dL (ref 0.0–149.0)
VLDL: 18.6 mg/dL (ref 0.0–40.0)

## 2021-06-04 LAB — HEPATIC FUNCTION PANEL
ALT: 14 U/L (ref 0–35)
AST: 14 U/L (ref 0–37)
Albumin: 4.1 g/dL (ref 3.5–5.2)
Alkaline Phosphatase: 60 U/L (ref 39–117)
Bilirubin, Direct: 0.1 mg/dL (ref 0.0–0.3)
Total Bilirubin: 0.7 mg/dL (ref 0.2–1.2)
Total Protein: 6.5 g/dL (ref 6.0–8.3)

## 2021-06-05 ENCOUNTER — Telehealth: Payer: Self-pay

## 2021-06-05 NOTE — Telephone Encounter (Signed)
Spoke w/ pt and advised of lab results. Pt expressed verbal understanding  ?

## 2021-06-05 NOTE — Telephone Encounter (Signed)
-----   Message from Sheliah Hatch, MD sent at 06/04/2021  8:33 PM EDT ----- ?Labs look great!  No changes at this time ?

## 2021-07-15 ENCOUNTER — Other Ambulatory Visit: Payer: Self-pay | Admitting: Family Medicine

## 2021-08-23 ENCOUNTER — Other Ambulatory Visit: Payer: Self-pay | Admitting: Family Medicine

## 2021-08-23 ENCOUNTER — Ambulatory Visit (INDEPENDENT_AMBULATORY_CARE_PROVIDER_SITE_OTHER): Payer: Medicare HMO

## 2021-08-23 DIAGNOSIS — Z0000 Encounter for general adult medical examination without abnormal findings: Secondary | ICD-10-CM | POA: Diagnosis not present

## 2021-08-23 NOTE — Progress Notes (Signed)
Subjective:   Sabrina Castaneda is a 86 y.o. female who presents for Medicare Annual (Subsequent) preventive examination.   I connected with Lendon Ka  today by telephone and verified that I am speaking with the correct person using two identifiers. Location patient: home Location provider: work Persons participating in the virtual visit: patient, provider.   I discussed the limitations, risks, security and privacy concerns of performing an evaluation and management service by telephone and the availability of in person appointments. I also discussed with the patient that there may be a patient responsible charge related to this service. The patient expressed understanding and verbally consented to this telephonic visit.    Interactive audio and video telecommunications were attempted between this provider and patient, however failed, due to patient having technical difficulties OR patient did not have access to video capability.  We continued and completed visit with audio only.    Review of Systems     Cardiac Risk Factors include: advanced age (>21men, >15 women)     Objective:    Today's Vitals   There is no height or weight on file to calculate BMI.     08/23/2021    2:12 PM 05/09/2021    2:01 PM 05/02/2021   11:36 AM 08/21/2020   12:47 PM 02/18/2020    8:42 AM 10/04/2019    2:49 PM 08/03/2019   10:35 AM  Advanced Directives  Does Patient Have a Medical Advance Directive? Yes No No Yes Yes Yes Yes  Type of Estate agent of [place];Living will   Living will;Healthcare Power of [employer] Power of [place];Living will Healthcare Power of [place];Living will Healthcare Power of [place];Living will  Does patient want to make changes to medical advance directive?     No - Patient declined No - Patient declined   Copy of Healthcare Power of Attorney in Chart? No - copy requested   No - copy requested No - copy requested No - copy requested No - copy  requested  Would patient like information on creating a medical advance directive?  No - Patient declined No - Patient declined        Current Medications (verified) Outpatient Encounter Medications as of 08/23/2021  Medication Sig   alendronate (FOSAMAX) 70 MG tablet Take 1 tablet (70 mg total) by mouth every 7 (seven) days.   aspirin 81 MG EC tablet Take by mouth.   atorvastatin (LIPITOR) 40 MG tablet Take 40 mg by mouth daily.   Cholecalciferol 50 MCG (2000 UT) CAPS TAKE 2 CAPSULES BY MOUTH EVERY DAY   levothyroxine (SYNTHROID) 75 MCG tablet TAKE 1 TABLET BY MOUTH EVERY DAY   loratadine (CLARITIN) 10 MG tablet Take by mouth.   omeprazole (PRILOSEC) 40 MG capsule TAKE 1 CAPSULE BY MOUTH EVERY DAY   oxybutynin (DITROPAN-XL) 10 MG 24 hr tablet TAKE 1 TABLET BY MOUTH EVERY DAY   sertraline (ZOLOFT) 25 MG tablet TAKE 1 TABLET (25 MG TOTAL) BY MOUTH DAILY.   [DISCONTINUED] sertraline (ZOLOFT) 25 MG tablet TAKE 1 TABLET (25 MG TOTAL) BY MOUTH DAILY.   No facility-administered encounter medications on file as of 08/23/2021.    Allergies (verified) Patient has no known allergies.   History: Past Medical History:  Diagnosis Date   Allergy    GERD (gastroesophageal reflux disease)    History of chicken pox    Hyperlipidemia    Scoliosis    Thyroid disease    Past Surgical History:  Procedure Laterality Date  ABDOMINAL HYSTERECTOMY  1980   ADENOIDECTOMY     HERNIA REPAIR  02/04/1966   REPLACEMENT TOTAL KNEE Right    TONSILLECTOMY     WRIST FRACTURE SURGERY Bilateral    Family History  Problem Relation Age of Onset   Allergic rhinitis Neg Hx    Angioedema Neg Hx    Asthma Neg Hx    Atopy Neg Hx    Eczema Neg Hx    Immunodeficiency Neg Hx    Urticaria Neg Hx    Social History   Socioeconomic History   Marital status: Widowed    Spouse name: Not on file   Number of children: Not on file   Years of education: Not on file   Highest education level: Not on file   Occupational History   Not on file  Tobacco Use   Smoking status: Never   Smokeless tobacco: Never  Vaping Use   Vaping Use: Never used  Substance and Sexual Activity   Alcohol use: Yes    Comment: occasionally   Drug use: Never   Sexual activity: Not Currently  Other Topics Concern   Not on file  Social History Narrative   Not on file   Social Determinants of Health   Financial Resource Strain: Low Risk  (08/23/2021)   Overall Financial Resource Strain (CARDIA)    Difficulty of Paying Living Expenses: Not hard at all  Food Insecurity: No Food Insecurity (08/23/2021)   Hunger Vital Sign    Worried About Running Out of Food in the Last Year: Never true    Ran Out of Food in the Last Year: Never true  Transportation Needs: No Transportation Needs (08/23/2021)   PRAPARE - Administrator, Civil Service (Medical): No    Lack of Transportation (Non-Medical): No  Physical Activity: Insufficiently Active (08/23/2021)   Exercise Vital Sign    Days of Exercise per Week: 3 days    Minutes of Exercise per Session: 30 min  Stress: No Stress Concern Present (08/23/2021)   [employer] of Occupational Health - Occupational Stress Questionnaire    Feeling of Stress : Not at all  Social Connections: Socially Isolated (08/23/2021)   Social Connection and Isolation Panel [NHANES]    Frequency of Communication with Friends and Family: Three times a week    Frequency of Social Gatherings with Friends and Family: Three times a week    Attends Religious Services: Never    Active Member of Clubs or Organizations: No    Attends Banker Meetings: Never    Marital Status: Widowed    Tobacco Counseling Counseling given: Not Answered   Clinical Intake:  Pre-visit preparation completed: Yes  Pain : No/denies pain     Nutritional Risks: None Diabetes: No  How often do you need to have someone help you when you read instructions, pamphlets, or other written  materials from your doctor or pharmacy?: 1 - Never What is the last grade level you completed in school?: College  Diabetic?no   Interpreter Needed?: No  Information entered by :: L.Wilson,LPN   Activities of Daily Living    08/23/2021    2:14 PM 06/01/2021    9:58 AM  In your present state of health, do you have any difficulty performing the following activities:  Hearing? 0 0  Vision? 0 0  Difficulty concentrating or making decisions? 0 0  Walking or climbing stairs? 0 1  Dressing or bathing? 0 0  Doing errands, shopping? 0 0  Preparing Food and eating ? N   Using the Toilet? N   In the past six months, have you accidently leaked urine? N   Do you have problems with loss of bowel control? N   Managing your Medications? N   Managing your Finances? N   Housekeeping or managing your Housekeeping? N     Patient Care Team: Sheliah Hatch, MD as PCP - General (Family Medicine)  Indicate any recent Medical Services you may have received from other than [facility] providers in the past year (date may be approximate).     Assessment:   This is a routine wellness examination for [place].  Hearing/Vision screen Vision Screening - Comments:: Annual eye exams wears glasses   Dietary issues and exercise activities discussed: Current Exercise Habits: Home exercise routine, Type of exercise: strength training/weights, Time (Minutes): 30, Frequency (Times/Week): 3, Weekly Exercise (Minutes/Week): 90, Intensity: Mild, Exercise limited by: orthopedic condition(s)   Goals Addressed   None    Depression Screen    08/23/2021    2:13 PM 08/23/2021    2:11 PM 08/23/2021    2:08 PM 06/01/2021    9:58 AM 12/01/2020   10:21 AM 08/21/2020   12:50 PM 07/25/2020   11:22 AM  PHQ 2/9 Scores  PHQ - 2 Score 0 0 0 0 0 0 0  PHQ- 9 Score    0 0  0    Fall Risk    08/23/2021    2:13 PM 06/01/2021    9:57 AM 12/01/2020   10:20 AM 11/08/2020   10:50 AM 08/21/2020   12:45 PM  Fall Risk   Falls in  the past year? 0 0 0 0 0  Number falls in past yr: 0 0 0 0 0  Injury with Fall? 0 0 0 0 0  Risk for fall due to : No Fall Risks No Fall Risks No Fall Risks No Fall Risks   Follow up Falls evaluation completed;Education provided Falls evaluation completed  Falls evaluation completed Falls evaluation completed;Falls prevention discussed    FALL RISK PREVENTION PERTAINING TO THE HOME:  Any stairs in or around the home? No  If so, are there any without handrails? No  Home free of loose throw rugs in walkways, pet beds, electrical cords, etc? Yes  Adequate lighting in your home to reduce risk of falls? Yes   ASSISTIVE DEVICES UTILIZED TO PREVENT FALLS:  Life alert? Yes  Use of a cane, walker or w/c? No  Grab bars in the bathroom? Yes  Shower chair or bench in shower? Yes  Elevated toilet seat or a handicapped toilet? Yes     Cognitive Function:  Normal cognitive status assessed by telephone conversation by this Nurse Health Advisor. No abnormalities found.        06/01/2021    9:59 AM 08/03/2019   10:41 AM  6CIT Screen  What Year? 0 points 0 points  What month? 0 points 0 points  What time? 0 points 0 points  Count back from 20 0 points 0 points  Months in reverse 0 points 0 points  Repeat phrase 0 points 0 points  Total Score 0 points 0 points    Immunizations Immunization History  Administered Date(s) Administered   Fluad Quad(high Dose 65+) 12/04/2019, 12/01/2020   H1N1 03/01/2008   Influenza, High Dose Seasonal PF 11/17/2011, 11/25/2014, 11/21/2015, 11/20/2016   Influenza-Unspecified 12/21/1998, 11/16/2003, 11/26/2004, 11/19/2005, 11/25/2006, 03/01/2008, 10/05/2008   Moderna Sars-Covid-2 Vaccination 02/16/2019, 03/17/2019, 12/22/2019   Pneumococcal  Conjugate-13 12/14/2014   Pneumococcal Polysaccharide-23 12/21/1998, 11/11/2008   Td 02/17/2007   Tdap 12/16/2008, 08/08/2020   Zoster Recombinat (Shingrix) 04/09/2005, 08/08/2020   Zoster, Live 04/09/2005    TDAP  status: Up to date  Flu Vaccine status: Up to date  Pneumococcal vaccine status: Up to date  Covid-19 vaccine status: Completed vaccines  Qualifies for Shingles Vaccine? Yes   Zostavax completed Yes   Shingrix Completed?: Yes  Screening Tests Health Maintenance  Topic Date Due   COVID-19 Vaccine (4 - Moderna series) 02/16/2020   INFLUENZA VACCINE  09/04/2021   TETANUS/TDAP  08/09/2030   Pneumonia Vaccine 18+ Years old  Completed   DEXA SCAN  Completed   Zoster Vaccines- Shingrix  Completed   HPV VACCINES  Aged Out    Health Maintenance  Health Maintenance Due  Topic Date Due   COVID-19 Vaccine (4 - Moderna series) 02/16/2020    Colorectal cancer screening: No longer required.   Mammogram status: No longer required due to age.  Bone Density status: Completed 09/26/2020. Results reflect: Bone density results: OSTEOPENIA. Repeat every 5 years.  Lung Cancer Screening: (Low Dose CT Chest recommended if Age 23-80 years, 30 pack-year currently smoking OR have quit w/in 15years.) does not qualify.   Lung Cancer Screening Referral: n/a  Additional Screening:  Hepatitis C Screening: does not qualify;   Vision Screening: Recommended annual ophthalmology exams for early detection of glaucoma and other disorders of the eye. Is the patient up to date with their annual eye exam?  Yes  Who is the provider or what is the name of the office in which the patient attends annual eye exams? [facility] If pt is not established with a provider, would they like to be referred to a provider to establish care? No .   Dental Screening: Recommended annual dental exams for proper oral hygiene  Community Resource Referral / Chronic Care Management: CRR required this visit?  No   CCM required this visit?  No      Plan:     I have personally reviewed and noted the following in the patient's chart:   Medical and social history Use of alcohol, tobacco or illicit drugs  Current  medications and supplements including opioid prescriptions.  Functional ability and status Nutritional status Physical activity Advanced directives List of other physicians Hospitalizations, surgeries, and ER visits in previous 12 months Vitals Screenings to include cognitive, depression, and falls Referrals and appointments  In addition, I have reviewed and discussed with patient certain preventive protocols, quality metrics, and best practice recommendations. A written personalized care plan for preventive services as well as general preventive health recommendations were provided to patient.     March Rummage, LPN   [id number]   Nurse Notes: none

## 2021-08-23 NOTE — Patient Instructions (Signed)
Sabrina Castaneda , Thank you for taking time to come for your Medicare Wellness Visit. I appreciate your ongoing commitment to your health goals. Please review the following plan we discussed and let me know if I can assist you in the future.   Screening recommendations/referrals: Colonoscopy: no longer required  Mammogram: no longer required  Bone Density: 09/26/2020 Recommended yearly ophthalmology/optometry visit for glaucoma screening and checkup Recommended yearly dental visit for hygiene and checkup  Vaccinations: Influenza vaccine: completed  Pneumococcal vaccine: completed  Tdap vaccine: 08/08/2020 Shingles vaccine: completed     Advanced directives: yes   Conditions/risks identified: none   Next appointment: none    Preventive Care 65 Years and Older, Female Preventive care refers to lifestyle choices and visits with your health care provider that can promote health and wellness. What does preventive care include? A yearly physical exam. This is also called an annual well check. Dental exams once or twice a year. Routine eye exams. Ask your health care provider how often you should have your eyes checked. Personal lifestyle choices, including: Daily care of your teeth and gums. Regular physical activity. Eating a healthy diet. Avoiding tobacco and drug use. Limiting alcohol use. Practicing safe sex. Taking low-dose aspirin every day. Taking vitamin and mineral supplements as recommended by your health care provider. What happens during an annual well check? The services and screenings done by your health care provider during your annual well check will depend on your age, overall health, lifestyle risk factors, and family history of disease. Counseling  Your health care provider may ask you questions about your: Alcohol use. Tobacco use. Drug use. Emotional well-being. Home and relationship well-being. Sexual activity. Eating habits. History of falls. Memory and  ability to understand (cognition). Work and work Astronomer. Reproductive health. Screening  You may have the following tests or measurements: Height, weight, and BMI. Blood pressure. Lipid and cholesterol levels. These may be checked every 5 years, or more frequently if you are over 20 years old. Skin check. Lung cancer screening. You may have this screening every year starting at age 50 if you have a 30-pack-year history of smoking and currently smoke or have quit within the past 15 years. Fecal occult blood test (FOBT) of the stool. You may have this test every year starting at age 28. Flexible sigmoidoscopy or colonoscopy. You may have a sigmoidoscopy every 5 years or a colonoscopy every 10 years starting at age 66. Hepatitis C blood test. Hepatitis B blood test. Sexually transmitted disease (STD) testing. Diabetes screening. This is done by checking your blood sugar (glucose) after you have not eaten for a while (fasting). You may have this done every 1-3 years. Bone density scan. This is done to screen for osteoporosis. You may have this done starting at age 24. Mammogram. This may be done every 1-2 years. Talk to your health care provider about how often you should have regular mammograms. Talk with your health care provider about your test results, treatment options, and if necessary, the need for more tests. Vaccines  Your health care provider may recommend certain vaccines, such as: Influenza vaccine. This is recommended every year. Tetanus, diphtheria, and acellular pertussis (Tdap, Td) vaccine. You may need a Td booster every 10 years. Zoster vaccine. You may need this after age 40. Pneumococcal 13-valent conjugate (PCV13) vaccine. One dose is recommended after age 49. Pneumococcal polysaccharide (PPSV23) vaccine. One dose is recommended after age 21. Talk to your health care provider about which screenings and vaccines  you need and how often you need them. This information is  not intended to replace advice given to you by your health care provider. Make sure you discuss any questions you have with your health care provider. Document Released: 02/17/2015 Document Revised: 10/11/2015 Document Reviewed: 11/22/2014 Elsevier Interactive Patient Education  2017 [employer] Prevention in the Home Falls can cause injuries. They can happen to people of all ages. There are many things you can do to make your home safe and to help prevent falls. What can I do on the outside of my home? Regularly fix the edges of walkways and driveways and fix any cracks. Remove anything that might make you trip as you walk through a door, such as a raised step or threshold. Trim any bushes or trees on the path to your home. Use bright outdoor lighting. Clear any walking paths of anything that might make someone trip, such as rocks or tools. Regularly check to see if handrails are loose or broken. Make sure that both sides of any steps have handrails. Any raised decks and porches should have guardrails on the edges. Have any leaves, snow, or ice cleared regularly. Use sand or salt on walking paths during winter. Clean up any spills in your garage right away. This includes oil or grease spills. What can I do in the bathroom? Use night lights. Install grab bars by the toilet and in the tub and shower. Do not use towel bars as grab bars. Use non-skid mats or decals in the tub or shower. If you need to sit down in the shower, use a plastic, non-slip stool. Keep the floor dry. Clean up any water that spills on the floor as soon as it happens. Remove soap buildup in the tub or shower regularly. Attach bath mats securely with double-sided non-slip rug tape. Do not have throw rugs and other things on the floor that can make you trip. What can I do in the bedroom? Use night lights. Make sure that you have a light by your bed that is easy to reach. Do not use any sheets or blankets that  are too big for your bed. They should not hang down onto the floor. Have a firm chair that has side arms. You can use this for support while you get dressed. Do not have throw rugs and other things on the floor that can make you trip. What can I do in the kitchen? Clean up any spills right away. Avoid walking on wet floors. Keep items that you use a lot in easy-to-reach places. If you need to reach something above you, use a strong step stool that has a grab bar. Keep electrical cords out of the way. Do not use floor polish or wax that makes floors slippery. If you must use wax, use non-skid floor wax. Do not have throw rugs and other things on the floor that can make you trip. What can I do with my stairs? Do not leave any items on the stairs. Make sure that there are handrails on both sides of the stairs and use them. Fix handrails that are broken or loose. Make sure that handrails are as long as the stairways. Check any carpeting to make sure that it is firmly attached to the stairs. Fix any carpet that is loose or worn. Avoid having throw rugs at the top or bottom of the stairs. If you do have throw rugs, attach them to the floor with carpet tape. Make sure  that you have a light switch at the top of the stairs and the bottom of the stairs. If you do not have them, ask someone to add them for you. What else can I do to help prevent falls? Wear shoes that: Do not have high heels. Have rubber bottoms. Are comfortable and fit you well. Are closed at the toe. Do not wear sandals. If you use a stepladder: Make sure that it is fully opened. Do not climb a closed stepladder. Make sure that both sides of the stepladder are locked into place. Ask someone to hold it for you, if possible. Clearly mark and make sure that you can see: Any grab bars or handrails. First and last steps. Where the edge of each step is. Use tools that help you move around (mobility aids) if they are needed. These  include: Canes. Walkers. Scooters. Crutches. Turn on the lights when you go into a dark area. Replace any light bulbs as soon as they burn out. Set up your furniture so you have a clear path. Avoid moving your furniture around. If any of your floors are uneven, fix them. If there are any pets around you, be aware of where they are. Review your medicines with your doctor. Some medicines can make you feel dizzy. This can increase your chance of falling. Ask your doctor what other things that you can do to help prevent falls. This information is not intended to replace advice given to you by your health care provider. Make sure you discuss any questions you have with your health care provider. Document Released: 11/17/2008 Document Revised: 06/29/2015 Document Reviewed: 02/25/2014 Elsevier Interactive Patient Education  2017 [employer].

## 2021-09-13 ENCOUNTER — Other Ambulatory Visit: Payer: Self-pay | Admitting: Family Medicine

## 2021-09-13 DIAGNOSIS — M8000XA Age-related osteoporosis with current pathological fracture, unspecified site, initial encounter for fracture: Secondary | ICD-10-CM

## 2021-10-29 ENCOUNTER — Telehealth: Payer: Self-pay

## 2021-10-29 ENCOUNTER — Ambulatory Visit (INDEPENDENT_AMBULATORY_CARE_PROVIDER_SITE_OTHER): Payer: Medicare HMO | Admitting: Family Medicine

## 2021-10-29 DIAGNOSIS — Z23 Encounter for immunization: Secondary | ICD-10-CM | POA: Diagnosis not present

## 2021-10-29 NOTE — Telephone Encounter (Signed)
Placed travel permission form in Dr Rande Lawman file up front with charge sheet attached

## 2021-10-29 NOTE — Progress Notes (Signed)
Pt presented today for annual flu shot, administered without concern.   Pt did bring form to be filled out today for travel coming in February, see phone note for details

## 2021-10-29 NOTE — Telephone Encounter (Signed)
  Will place form in DR Tabori to be signed folder once signed I will fax back and notify pt

## 2021-10-29 NOTE — Telephone Encounter (Signed)
Placed in your sign folder.

## 2021-11-05 ENCOUNTER — Ambulatory Visit (INDEPENDENT_AMBULATORY_CARE_PROVIDER_SITE_OTHER): Payer: Medicare HMO | Admitting: Family Medicine

## 2021-11-05 ENCOUNTER — Encounter: Payer: Self-pay | Admitting: Family Medicine

## 2021-11-05 VITALS — BP 126/60 | HR 90 | Temp 97.2°F | Resp 16 | Ht 63.0 in | Wt 153.1 lb

## 2021-11-05 DIAGNOSIS — Z7184 Encounter for health counseling related to travel: Secondary | ICD-10-CM | POA: Diagnosis not present

## 2021-11-05 DIAGNOSIS — Z23 Encounter for immunization: Secondary | ICD-10-CM | POA: Diagnosis not present

## 2021-11-05 MED ORDER — ACETAZOLAMIDE 125 MG PO TABS: 125.0000 mg | ORAL_TABLET | Freq: Two times a day (BID) | ORAL | 0 refills | Status: DC

## 2021-11-05 MED ORDER — TYPHOID VACCINE PO CPDR: 1.0000 | DELAYED_RELEASE_CAPSULE | ORAL | 0 refills | Status: DC

## 2021-11-05 MED ORDER — ATOVAQUONE-PROGUANIL HCL 250-100 MG PO TABS: 1.0000 | ORAL_TABLET | Freq: Every day | ORAL | 0 refills | Status: DC

## 2021-11-05 NOTE — Patient Instructions (Addendum)
Follow up as needed or as scheduled You got your Pneumonia booster today The only things remaining are the Yellow Fever vaccine (at the [facility]) and the Leon booster at the pharmacy I sent the oral Typhoid vaccine and you can complete that any time between now and mid-January Start the Atovaquone-Proguanil (Malarone) 2 days before arrival, take daily while traveling, and for 7 days upon arrival home Use the Diamox (Acetazolamide) starting 24 hrs prior to reaching high altitude Call with any questions or concerns Stay Safe!  Stay Healthy! Have an AMAZING adventure!!!

## 2021-11-05 NOTE — Progress Notes (Signed)
   Subjective:    Patient ID: Sabrina Castaneda, female    DOB: [birth year], 86 y.o.   MRN: [id number]  HPI Travel encounter- pt is going to [place] 2/26-3/18.  She went to [employer] and has a lot of information regarding travel and vaccines and what is recommended vs required.     Review of Systems For ROS see HPI     Objective:   Physical Exam Vitals reviewed.  Constitutional:      General: She is not in acute distress.    Appearance: Normal appearance. She is not ill-appearing.  HENT:     Head: Normocephalic and atraumatic.  Eyes:     Extraocular Movements: Extraocular movements intact.     Conjunctiva/sclera: Conjunctivae normal.     Pupils: Pupils are equal, round, and reactive to light.  Cardiovascular:     Rate and Rhythm: Normal rate and regular rhythm.  Pulmonary:     Effort: Pulmonary effort is normal. No respiratory distress.     Breath sounds: Normal breath sounds.  Musculoskeletal:     Right lower leg: No edema.     Left lower leg: No edema.  Skin:    General: Skin is warm and dry.  Neurological:     General: No focal deficit present.     Mental Status: She is alert and oriented to person, place, and time.  Psychiatric:        Mood and Affect: Mood normal.        Behavior: Behavior normal.        Thought Content: Thought content normal.           Assessment & Plan:  Travel advice encounter- new.  Pt is going on a 3 week trip around [place] and brought in quite a bit of information to review to determine what is needed prior to travel.  She has had the Hep A/Hep B series.  She had a meningitis booster, IPV booster.  She needs to repeat her Typhoid vaccine since her last was 2005 and it is only good for 5 yrs.  I do not see documentation of the yellow fever vaccine- she will need to get this at the health dept.  She is too old to receive the Cholera vaccine as it is only approved for ages 2-64.  Will get PNA booster today (Prevnar 20).  Has already had flu  shot.  Will get COVID booster at pharmacy.  Prescriptions given for Acetazolamide and Malarone along w/ instructions on when to use them.  Total time spent counseling pt and reviewing forms with her- 30 minutes.

## 2021-11-07 NOTE — Telephone Encounter (Signed)
This was completed and returned to pt at the time of her visit

## 2021-11-20 ENCOUNTER — Other Ambulatory Visit: Payer: Self-pay | Admitting: Family Medicine

## 2021-11-28 ENCOUNTER — Other Ambulatory Visit: Payer: Self-pay | Admitting: Family Medicine

## 2021-11-30 ENCOUNTER — Encounter: Payer: Self-pay | Admitting: Family Medicine

## 2021-12-07 NOTE — Telephone Encounter (Signed)
Can you advise patient of medication or should they make an appointment?

## 2021-12-08 ENCOUNTER — Other Ambulatory Visit: Payer: Self-pay

## 2021-12-08 ENCOUNTER — Encounter (HOSPITAL_COMMUNITY): Payer: Self-pay | Admitting: *Deleted

## 2021-12-08 ENCOUNTER — Emergency Department (HOSPITAL_COMMUNITY): Payer: Medicare HMO

## 2021-12-08 ENCOUNTER — Emergency Department (HOSPITAL_COMMUNITY)
Admission: EM | Admit: 2021-12-08 | Discharge: 2021-12-08 | Disposition: A | Discharge status: Home / Self Care | Payer: Medicare HMO | Attending: Emergency Medicine | Admitting: Emergency Medicine

## 2021-12-08 DIAGNOSIS — R300 Dysuria: Secondary | ICD-10-CM | POA: Diagnosis not present

## 2021-12-08 DIAGNOSIS — R791 Abnormal coagulation profile: Secondary | ICD-10-CM | POA: Insufficient documentation

## 2021-12-08 DIAGNOSIS — R42 Dizziness and giddiness: Secondary | ICD-10-CM | POA: Diagnosis present

## 2021-12-08 DIAGNOSIS — Z7982 Long term (current) use of aspirin: Secondary | ICD-10-CM | POA: Insufficient documentation

## 2021-12-08 LAB — URINALYSIS, ROUTINE W REFLEX MICROSCOPIC
Bilirubin Urine: NEGATIVE
Glucose, UA: NEGATIVE mg/dL
Hgb urine dipstick: NEGATIVE
Ketones, ur: NEGATIVE mg/dL
Nitrite: NEGATIVE
Protein, ur: NEGATIVE mg/dL
Specific Gravity, Urine: 1.004 — ABNORMAL LOW (ref 1.005–1.030)
pH: 6 (ref 5.0–8.0)

## 2021-12-08 LAB — CBC WITH DIFFERENTIAL/PLATELET
Abs Immature Granulocytes: 0.01 10*3/uL (ref 0.00–0.07)
Basophils Absolute: 0.1 10*3/uL (ref 0.0–0.1)
Basophils Relative: 1 %
Eosinophils Absolute: 0.7 10*3/uL — ABNORMAL HIGH (ref 0.0–0.5)
Eosinophils Relative: 11 %
HCT: 40.4 % (ref 36.0–46.0)
Hemoglobin: 13.2 g/dL (ref 12.0–15.0)
Immature Granulocytes: 0 %
Lymphocytes Relative: 23 %
Lymphs Abs: 1.5 10*3/uL (ref 0.7–4.0)
MCH: 28.7 pg (ref 26.0–34.0)
MCHC: 32.7 g/dL (ref 30.0–36.0)
MCV: 87.8 fL (ref 80.0–100.0)
Monocytes Absolute: 0.7 10*3/uL (ref 0.1–1.0)
Monocytes Relative: 11 %
Neutro Abs: 3.5 10*3/uL (ref 1.7–7.7)
Neutrophils Relative %: 54 %
Platelets: 342 10*3/uL (ref 150–400)
RBC: 4.6 MIL/uL (ref 3.87–5.11)
RDW: 12.7 % (ref 11.5–15.5)
WBC: 6.4 10*3/uL (ref 4.0–10.5)
nRBC: 0 % (ref 0.0–0.2)

## 2021-12-08 LAB — COMPREHENSIVE METABOLIC PANEL
ALT: 18 U/L (ref 0–44)
AST: 19 U/L (ref 15–41)
Albumin: 3.8 g/dL (ref 3.5–5.0)
Alkaline Phosphatase: 62 U/L (ref 38–126)
Anion gap: 8 (ref 5–15)
BUN: 13 mg/dL (ref 8–23)
CO2: 23 mmol/L (ref 22–32)
Calcium: 9.1 mg/dL (ref 8.9–10.3)
Chloride: 104 mmol/L (ref 98–111)
Creatinine, Ser: 0.67 mg/dL (ref 0.44–1.00)
GFR, Estimated: >60 mL/min (ref >60)
Glucose, Bld: 99 mg/dL (ref 70–99)
Potassium: 4.2 mmol/L (ref 3.5–5.1)
Sodium: 135 mmol/L (ref 135–145)
Total Bilirubin: 0.9 mg/dL (ref 0.3–1.2)
Total Protein: 6.4 g/dL — ABNORMAL LOW (ref 6.5–8.1)

## 2021-12-08 LAB — CBG MONITORING, ED: Glucose-Capillary: 102 mg/dL — ABNORMAL HIGH (ref 70–99)

## 2021-12-08 LAB — TROPONIN I (HIGH SENSITIVITY): Troponin I (High Sensitivity): 4 ng/L (ref <18)

## 2021-12-08 LAB — PROTIME-INR
INR: 1 (ref 0.8–1.2)
Prothrombin Time: 13.5 seconds (ref 11.4–15.2)

## 2021-12-08 MED ORDER — ONDANSETRON HCL 4 MG PO TABS: 4.0000 mg | ORAL_TABLET | Freq: Four times a day (QID) | ORAL | 0 refills | Status: DC

## 2021-12-08 MED ORDER — CEPHALEXIN 500 MG PO CAPS
500.0000 mg | ORAL_CAPSULE | Freq: Once | ORAL | Status: AC
  Administered 2021-12-08: 500 mg via ORAL
  Filled 2021-12-08: qty 1

## 2021-12-08 MED ORDER — HYDROXYZINE HCL 25 MG PO TABS: 25.0000 mg | ORAL_TABLET | Freq: Three times a day (TID) | ORAL | 0 refills | Status: DC | PRN

## 2021-12-08 MED ORDER — SODIUM CHLORIDE 0.9 % IV BOLUS
500.0000 mL | Freq: Once | INTRAVENOUS | Status: AC
  Administered 2021-12-08: 500 mL via INTRAVENOUS

## 2021-12-08 MED ORDER — GADOBUTROL 1 MMOL/ML IV SOLN
7.0000 mL | Freq: Once | INTRAVENOUS | Status: AC | PRN
  Administered 2021-12-08: 7 mL via INTRAVENOUS

## 2021-12-08 MED ORDER — ONDANSETRON HCL 4 MG/2ML IJ SOLN
4.0000 mg | Freq: Once | INTRAMUSCULAR | Status: AC
  Administered 2021-12-08: 4 mg via INTRAVENOUS
  Filled 2021-12-08: qty 2

## 2021-12-08 MED ORDER — MECLIZINE HCL 25 MG PO TABS
12.5000 mg | ORAL_TABLET | Freq: Once | ORAL | Status: AC
  Administered 2021-12-08: 12.5 mg via ORAL
  Filled 2021-12-08: qty 1

## 2021-12-08 MED ORDER — CEPHALEXIN 500 MG PO CAPS: 500.0000 mg | ORAL_CAPSULE | Freq: Four times a day (QID) | ORAL | 0 refills | Status: DC

## 2021-12-08 NOTE — ED Provider Notes (Signed)
[facility] DEPT Provider Note   CSN: [id number] Arrival date & time: 12/08/21  1341     History  Chief Complaint  Patient presents with   Dizziness    Sabrina Castaneda is a 86 y.o. female.  86 year old female with prior medical history as detailed below presents for evaluation.  Patient with onset of dizziness and vertigo this a.m.  She reports that the symptoms were present when she awoke.  She apparently went to urgent care who referred her to the ED for evaluation.  Patient reports similar symptoms in the past with concurrent UTI.  She denies fever.  She reports that the dizziness is most noticeable when she tries to ambulate.  She feels very unsteady on her feet.  She denies documented prior history of positional vertigo.  Patient without visual change, focal weakness, difficulty with speech.  She denies recent fever, nausea, vomiting, chest pain, shortness of breath.  The history is provided by the patient and medical records.       Home Medications Prior to Admission medications   Medication Sig Start Date End Date Taking? Authorizing Provider  cephALEXin (KEFLEX) 500 MG capsule Take 1 capsule (500 mg total) by mouth 4 (four) times daily. 12/08/21  Yes Valarie Merino, MD  hydrOXYzine (ATARAX) 25 MG tablet Take 1 tablet (25 mg total) by mouth every 8 (eight) hours as needed. 12/08/21  Yes Valarie Merino, MD  ondansetron (ZOFRAN) 4 MG tablet Take 1 tablet (4 mg total) by mouth every 6 (six) hours. 12/08/21  Yes Valarie Merino, MD  acetaZOLAMIDE (DIAMOX) 125 MG tablet TAKE 1 TABLET (125 MG TOTAL) BY MOUTH 2 (TWO) TIMES DAILY. 1ST DOSE TO START 24 HRS BEFORE ARRIVAL AT HIGH ALTITUDE 11/28/21   Midge Minium, MD  alendronate (FOSAMAX) 70 MG tablet TAKE 1 TABLET BY MOUTH EVERY 7 DAYS. 09/13/21   Midge Minium, MD  aspirin 81 MG EC tablet Take by mouth.    [provider]  atorvastatin (LIPITOR) 40 MG tablet Take 40 mg by  mouth daily. 04/12/19   [provider]  atovaquone-proguanil (MALARONE) 250-100 MG TABS tablet Take 1 tablet by mouth daily. Start 2 days before arrival 11/05/21   Midge Minium, MD  Cholecalciferol 50 MCG (2000 UT) CAPS TAKE 2 CAPSULES BY MOUTH EVERY DAY 09/02/15   [provider]  levothyroxine (SYNTHROID) 75 MCG tablet TAKE 1 TABLET BY MOUTH EVERY DAY 07/16/21   Midge Minium, MD  loratadine (CLARITIN) 10 MG tablet Take by mouth.    [provider]  omeprazole (PRILOSEC) 40 MG capsule TAKE 1 CAPSULE BY MOUTH EVERY DAY 12/20/20   Midge Minium, MD  oxybutynin (DITROPAN-XL) 10 MG 24 hr tablet TAKE 1 TABLET BY MOUTH EVERY DAY 07/16/21   Midge Minium, MD  sertraline (ZOLOFT) 25 MG tablet TAKE 1 TABLET (25 MG TOTAL) BY MOUTH DAILY. 11/20/21   Midge Minium, MD  typhoid (VIVOTIF) DR capsule Take 1 capsule by mouth every other day. Take at least 1 month prior to travel 11/05/21   Midge Minium, MD      Allergies    Patient has no known allergies.    Review of Systems   Review of Systems  All other systems reviewed and are negative.   Physical Exam Updated Vital Signs BP (!) 148/77   Pulse 87   Temp 98.6 F (37 C) (Oral)   Resp 18   Ht 5\' 2"  (1.575  m)   Wt 68 kg   SpO2 96%   BMI 27.44 kg/m  Physical Exam Vitals and nursing note reviewed.  Constitutional:      General: She is not in acute distress.    Appearance: Normal appearance. She is well-developed.  HENT:     Head: Normocephalic and atraumatic.  Eyes:     Conjunctiva/sclera: Conjunctivae normal.     Pupils: Pupils are equal, round, and reactive to light.  Cardiovascular:     Rate and Rhythm: Normal rate and regular rhythm.     Heart sounds: Normal heart sounds.  Pulmonary:     Effort: Pulmonary effort is normal. No respiratory distress.     Breath sounds: Normal breath sounds.  Abdominal:     General: There is no distension.     Palpations: Abdomen is soft.      Tenderness: There is no abdominal tenderness.  Musculoskeletal:        General: No deformity. Normal range of motion.     Cervical back: Normal range of motion and neck supple.  Skin:    General: Skin is warm and dry.  Neurological:     General: No focal deficit present.     Mental Status: She is alert and oriented to person, place, and time. Mental status is at baseline.     Cranial Nerves: No cranial nerve deficit.     Sensory: No sensory deficit.     Motor: No weakness.     Coordination: Coordination normal.     ED Results / Procedures / Treatments   Labs (all labs ordered are listed, but only abnormal results are displayed) Labs Reviewed  CBC WITH DIFFERENTIAL/PLATELET - Abnormal; Notable for the following components:      Result Value   Eosinophils Absolute 0.7 (*)    All other components within normal limits  COMPREHENSIVE METABOLIC PANEL - Abnormal; Notable for the following components:   Total Protein 6.4 (*)    All other components within normal limits  URINALYSIS, ROUTINE W REFLEX MICROSCOPIC - Abnormal; Notable for the following components:   Color, Urine STRAW (*)    Specific Gravity, Urine 1.004 (*)    Leukocytes,Ua MODERATE (*)    Bacteria, UA RARE (*)    All other components within normal limits  CBG MONITORING, ED - Abnormal; Notable for the following components:   Glucose-Capillary 102 (*)    All other components within normal limits  URINE CULTURE  PROTIME-INR  TROPONIN I (HIGH SENSITIVITY)  TROPONIN I (HIGH SENSITIVITY)    EKG None  Radiology MR Brain W and Wo Contrast  Result Date: 12/08/2021 CLINICAL DATA:  Provided history: Dizziness, persistent/recurrent, cardiac or vascular cause suspected. Additional history provided: Dizziness, onset this morning. EXAM: MRI HEAD WITHOUT AND WITH CONTRAST TECHNIQUE: Multiplanar, multiecho pulse sequences of the brain and surrounding structures were obtained without and with intravenous contrast. CONTRAST:  38mL  GADAVIST GADOBUTROL 1 MMOL/ML IV SOLN COMPARISON:  No pertinent prior exams available for comparison. FINDINGS: Brain: Mild generalized parenchymal atrophy. Moderate multifocal T2 FLAIR hyperintense signal abnormality within the cerebral white matter, nonspecific but compatible with chronic small vessel ischemic disease. Small T2 hyperintense focus within the inferior right basal ganglia, which may reflect a prominent perivascular space or chronic lacunar infarct. Punctate focus of SWI signal loss within the right cerebellar hemisphere, likely reflecting a chronic microhemorrhage. Cavum septum pellucidum and cavum vergae (anatomic variant). There is no acute infarct. No evidence of an intracranial mass. No extra-axial fluid collection. No  midline shift. No pathologic intracranial enhancement identified. Vascular: Maintained flow voids within the proximal large arterial vessels. Skull and upper cervical spine: No focal suspicious marrow lesion. Incompletely assessed cervical spondylosis. Sinuses/Orbits: No mass or acute finding within the imaged orbits. Prior bilateral ocular lens replacement. Mild mucosal thickening within the bilateral ethmoid air cells. Other: Right mastoid effusion. IMPRESSION: 1. No evidence of acute intracranial abnormality. 2. Moderate chronic small vessel ischemic changes within the cerebral white matter. 3. Prominent perivascular space versus chronic lacunar infarct within the inferior right basal ganglia. 4. Mild generalized parenchymal atrophy. 5. Mild bilateral ethmoid sinus mucosal thickening. 6. Right mastoid effusion. Electronically Signed   By: Kellie Simmering D.O.   On: 12/08/2021 17:14    Procedures Procedures    Medications Ordered in ED Medications  cephALEXin (KEFLEX) capsule 500 mg (has no administration in time range)  meclizine (ANTIVERT) tablet 12.5 mg (12.5 mg Oral Given 12/08/21 1452)  ondansetron (ZOFRAN) injection 4 mg (4 mg Intravenous Given 12/08/21 1452)  sodium  chloride 0.9 % bolus 500 mL (500 mLs Intravenous New Bag/Given 12/08/21 1452)  gadobutrol (GADAVIST) 1 MMOL/ML injection 7 mL (7 mLs Intravenous Contrast Given 12/08/21 1654)    ED Course/ Medical Decision Making/ A&P                           Medical Decision Making Amount and/or Complexity of Data Reviewed Labs: ordered. Radiology: ordered.  Risk Prescription drug management.    Medical Screen Complete  This patient presented to the ED with complaint of dizziness.  This complaint involves an extensive number of treatment options. The initial differential diagnosis includes, but is not limited to, vertigo, CNS event, infection, metabolic abnormality, etc.  This presentation is: Acute, Self-Limited, Previously Undiagnosed, Uncertain Prognosis, Complicated, Systemic Symptoms, and Threat to Life/Bodily Function  Patient is presenting with complaint of vertiginous symptoms.  Patient is concerned about possible concurrent UTI.  Patient sent from urgent care for further evaluation and evaluation for possible CNS event such as stroke.  Patient given Antivert on arrival.  MRI is without evidence of acute stroke.  Patient is significantly improved with Antivert administration.  Patient UA is without clear evidence of UTI.  However, given patient's mild early symptoms and concern over early UTI will prescribe course of antibiotics.  Patient does understand need for close outpatient follow-up.  Patient is agreeable with prescription of Antivert and Zofran for home use.  Strict return precautions given and understood.  Importance of close follow-up was stressed.  Additional history obtained:  Additional history obtained from [facility] External records from outside sources obtained and reviewed including prior ED visits and prior Inpatient records.    Lab Tests:  I ordered and personally interpreted labs.  The pertinent results include:  CBC CMP INR TROP   Imaging Studies  ordered:  I ordered imaging studies including MRI brain  I independently visualized and interpreted obtained imaging which showed No acute pathology I agree with the radiologist interpretation.   Cardiac Monitoring:  The patient was maintained on a cardiac monitor.  I personally viewed and interpreted the cardiac monitor which showed an underlying rhythm of: NSR   Medicines ordered:  I ordered medication including antivert, zofran for vertigo  Reevaluation of the patient after these medicines showed that the patient: improved    Problem List / ED Course:  Vertigo, suspected UTI   Reevaluation:  After the interventions noted above, I reevaluated the patient and found  that they have: improved   Disposition:  After consideration of the diagnostic results and the patients response to treatment, I feel that the patent would benefit from close outpatient follow-up.          Final Clinical Impression(s) / ED Diagnoses Final diagnoses:  Dizziness    Rx / DC Orders ED Discharge Orders          Ordered    cephALEXin (KEFLEX) 500 MG capsule  4 times daily        12/08/21 1740    ondansetron (ZOFRAN) 4 MG tablet  Every 6 hours        12/08/21 1740    hydrOXYzine (ATARAX) 25 MG tablet  Every 8 hours PRN        12/08/21 1740              Valarie Merino, MD 12/08/21 1811

## 2021-12-08 NOTE — Discharge Instructions (Addendum)
Return for any problem.  ?

## 2021-12-08 NOTE — ED Triage Notes (Signed)
Sent from [facility] for MRI, concern for CVA. C/o dizziness only. Onset this am when she lifted her head up from pillow. Mentions she thought it might be a UTI. Endorses chang in stream only. Denies dysuria, hematuria, or frequency. Dizzy when looking down, moving, and when reaching. Here with family. Alert, NAD, calm, interactive.

## 2021-12-08 NOTE — ED Provider Triage Note (Signed)
Emergency Medicine Provider Triage Evaluation Note  Sabrina Castaneda , a 86 y.o. female  was evaluated in triage.  Pt complains of dizziness.  Pt seen at urgent care and sent here for evaluation   Review of Systems  Positive: dizziness Negative: No weakness  Physical Exam  BP (!) 147/67 (BP Location: Left Arm)   Pulse 76   Temp 98.6 F (37 C) (Oral)   Resp 16   SpO2 99%  Gen:   Awake, no distress   Resp:  Normal effort  MSK:   Moves extremities without difficulty  Other:    Medical Decision Making  Medically screening exam initiated at 2:03 PM.  Appropriate orders placed.  Sabrina Castaneda was informed that the remainder of the evaluation will be completed by another provider, this initial triage assessment does not replace that evaluation, and the importance of remaining in the ED until their evaluation is complete.     Fransico Meadow, [place] 12/08/21 1404

## 2021-12-10 LAB — URINE CULTURE

## 2022-01-09 ENCOUNTER — Other Ambulatory Visit: Payer: Self-pay | Admitting: Family Medicine

## 2022-01-16 ENCOUNTER — Other Ambulatory Visit: Payer: Self-pay | Admitting: Family Medicine

## 2022-02-27 ENCOUNTER — Other Ambulatory Visit: Payer: Self-pay | Admitting: Family Medicine

## 2022-02-27 MED ORDER — OMEPRAZOLE 40 MG PO CPDR: 40.0000 mg | DELAYED_RELEASE_CAPSULE | Freq: Every day | ORAL | 1 refills | Status: DC

## 2022-02-27 NOTE — Addendum Note (Signed)
Addended by: Patrcia Dolly on: 02/27/2022 11:59 AM   Modules accepted: Orders

## 2022-05-13 ENCOUNTER — Other Ambulatory Visit: Payer: Self-pay | Admitting: Family Medicine

## 2022-06-16 ENCOUNTER — Other Ambulatory Visit: Payer: Self-pay | Admitting: Family Medicine

## 2022-06-20 ENCOUNTER — Ambulatory Visit (INDEPENDENT_AMBULATORY_CARE_PROVIDER_SITE_OTHER): Payer: Medicare HMO | Admitting: Family Medicine

## 2022-06-20 ENCOUNTER — Encounter: Payer: Self-pay | Admitting: Family Medicine

## 2022-06-20 VITALS — BP 122/80 | HR 71 | Temp 98.0°F | Resp 16 | Ht 62.0 in | Wt 150.5 lb

## 2022-06-20 DIAGNOSIS — E039 Hypothyroidism, unspecified: Secondary | ICD-10-CM | POA: Diagnosis not present

## 2022-06-20 DIAGNOSIS — E785 Hyperlipidemia, unspecified: Secondary | ICD-10-CM | POA: Diagnosis not present

## 2022-06-20 LAB — LIPID PANEL
Cholesterol: 208 mg/dL — ABNORMAL HIGH (ref 0–200)
HDL: 68.7 mg/dL (ref >39.00)
LDL Cholesterol: 115 mg/dL — ABNORMAL HIGH (ref 0–99)
NonHDL: 139.2
Total CHOL/HDL Ratio: 3
Triglycerides: 123 mg/dL (ref 0.0–149.0)
VLDL: 24.6 mg/dL (ref 0.0–40.0)

## 2022-06-20 LAB — CBC WITH DIFFERENTIAL/PLATELET
Basophils Absolute: 0.1 10*3/uL (ref 0.0–0.1)
Basophils Relative: 1.2 % (ref 0.0–3.0)
Eosinophils Absolute: 0.1 10*3/uL (ref 0.0–0.7)
Eosinophils Relative: 1 % (ref 0.0–5.0)
HCT: 42.3 % (ref 36.0–46.0)
Hemoglobin: 14.1 g/dL (ref 12.0–15.0)
Lymphocytes Relative: 23.8 % (ref 12.0–46.0)
Lymphs Abs: 1.3 10*3/uL (ref 0.7–4.0)
MCHC: 33.3 g/dL (ref 30.0–36.0)
MCV: 86.5 fl (ref 78.0–100.0)
Monocytes Absolute: 0.7 10*3/uL (ref 0.1–1.0)
Monocytes Relative: 12.4 % — ABNORMAL HIGH (ref 3.0–12.0)
Neutro Abs: 3.3 10*3/uL (ref 1.4–7.7)
Neutrophils Relative %: 61.6 % (ref 43.0–77.0)
Platelets: 354 10*3/uL (ref 150.0–400.0)
RBC: 4.89 Mil/uL (ref 3.87–5.11)
RDW: 14.2 % (ref 11.5–15.5)
WBC: 5.4 10*3/uL (ref 4.0–10.5)

## 2022-06-20 LAB — BASIC METABOLIC PANEL
BUN: 9 mg/dL (ref 6–23)
CO2: 29 mEq/L (ref 19–32)
Calcium: 9.3 mg/dL (ref 8.4–10.5)
Chloride: 99 mEq/L (ref 96–112)
Creatinine, Ser: 0.65 mg/dL (ref 0.40–1.20)
GFR: 80.72 mL/min (ref >60.00)
Glucose, Bld: 83 mg/dL (ref 70–99)
Potassium: 4.4 mEq/L (ref 3.5–5.1)
Sodium: 136 mEq/L (ref 135–145)

## 2022-06-20 LAB — TSH: TSH: 1.52 u[IU]/mL (ref 0.35–5.50)

## 2022-06-20 LAB — HEPATIC FUNCTION PANEL
ALT: 16 U/L (ref 0–35)
AST: 20 U/L (ref 0–37)
Albumin: 4 g/dL (ref 3.5–5.2)
Alkaline Phosphatase: 64 U/L (ref 39–117)
Bilirubin, Direct: 0.1 mg/dL (ref 0.0–0.3)
Total Bilirubin: 0.8 mg/dL (ref 0.2–1.2)
Total Protein: 6.7 g/dL (ref 6.0–8.3)

## 2022-06-20 MED ORDER — ATORVASTATIN CALCIUM 40 MG PO TABS: 40.0000 mg | ORAL_TABLET | Freq: Every day | ORAL | 1 refills | Status: DC

## 2022-06-20 NOTE — Assessment & Plan Note (Signed)
Chronic problem.  On Lipitor 40mg daily w/o difficulty.  Check labs.  Adjust meds prn  ?

## 2022-06-20 NOTE — Patient Instructions (Signed)
Schedule your complete physical in 6 months We'll notify you of your lab results and make any changes if needed Keep up the good work!  You look fantastic! Call with any questions or concerns Stay Safe!  Stay Healthy! Have a great summer!!!

## 2022-06-20 NOTE — Assessment & Plan Note (Signed)
Chronic problem.  On Levothyroxine and currently asymptomatic.  Check labs.  Adjust meds prn

## 2022-06-20 NOTE — Progress Notes (Signed)
   Subjective:    Patient ID: Sabrina Castaneda, female    DOB: 1935/10/30, 87 y.o.   MRN: [id number]  HPI Hyperlipidemia- chronic problem, on Lipitor 40mg  daily.  Pt is now walking regularly.  No CP, SOB, abd pain, N/V.  Hypothyroid- chronic problem, on Levothyroxine daily.  Pt reports good energy level.  Denies changes to skin/hair/nails.   Review of Systems For ROS see HPI     Objective:   Physical Exam Vitals reviewed.  Constitutional:      General: She is not in acute distress.    Appearance: Normal appearance. She is well-developed. She is not ill-appearing.  HENT:     Head: Normocephalic and atraumatic.  Eyes:     Conjunctiva/sclera: Conjunctivae normal.     Pupils: Pupils are equal, round, and reactive to light.  Neck:     Thyroid: No thyromegaly.  Cardiovascular:     Rate and Rhythm: Normal rate and regular rhythm.     Pulses: Normal pulses.     Heart sounds: Normal heart sounds. No murmur heard. Pulmonary:     Effort: Pulmonary effort is normal. No respiratory distress.     Breath sounds: Normal breath sounds.  Abdominal:     General: There is no distension.     Palpations: Abdomen is soft.     Tenderness: There is no abdominal tenderness.  Musculoskeletal:     Cervical back: Normal range of motion and neck supple.     Right lower leg: No edema.     Left lower leg: No edema.  Lymphadenopathy:     Cervical: No cervical adenopathy.  Skin:    General: Skin is warm and dry.  Neurological:     General: No focal deficit present.     Mental Status: She is alert and oriented to person, place, and time.  Psychiatric:        Mood and Affect: Mood normal.        Behavior: Behavior normal.        Thought Content: Thought content normal.           Assessment & Plan:

## 2022-06-21 ENCOUNTER — Telehealth: Payer: Self-pay

## 2022-06-21 NOTE — Telephone Encounter (Signed)
Pt aware of lab results 

## 2022-06-21 NOTE — Telephone Encounter (Signed)
-----   Message from Sheliah Hatch, MD sent at 06/21/2022  7:37 AM EDT ----- Labs look great!  No changes at this time

## 2022-08-08 ENCOUNTER — Other Ambulatory Visit: Payer: Self-pay | Admitting: Family Medicine

## 2022-08-08 DIAGNOSIS — M8000XA Age-related osteoporosis with current pathological fracture, unspecified site, initial encounter for fracture: Secondary | ICD-10-CM

## 2022-08-28 ENCOUNTER — Other Ambulatory Visit: Payer: Self-pay | Admitting: Family Medicine

## 2022-08-29 ENCOUNTER — Ambulatory Visit (INDEPENDENT_AMBULATORY_CARE_PROVIDER_SITE_OTHER): Payer: Medicare HMO | Admitting: *Deleted

## 2022-08-29 DIAGNOSIS — Z780 Asymptomatic menopausal state: Secondary | ICD-10-CM | POA: Diagnosis not present

## 2022-08-29 DIAGNOSIS — Z0000 Encounter for general adult medical examination without abnormal findings: Secondary | ICD-10-CM

## 2022-08-29 NOTE — Progress Notes (Signed)
Subjective:   Sabrina Castaneda is a 87 y.o. female who presents for Medicare Annual (Subsequent) preventive examination.  Visit Complete: Virtual  I connected with  [name] on 08/29/22 by a audio enabled telemedicine application and verified that I am speaking with the correct person using two identifiers.  Patient Location: Home  Provider Location: Home Office  I discussed the limitations of evaluation and management by telemedicine. The patient expressed understanding and agreed to proceed.  Vital Signs: Unable to obtain new vitals due to this being a telehealth visit.     Review of Systems     Cardiac Risk Factors include: advanced age (>4men, >45 women);family history of premature cardiovascular disease;hypertension     Objective:    Today's Vitals   There is no height or weight on file to calculate BMI.     08/29/2022    2:02 PM 12/08/2021    2:14 PM 08/23/2021    2:12 PM 05/09/2021    2:01 PM 05/02/2021   11:36 AM 08/21/2020   12:47 PM 02/18/2020    8:42 AM  Advanced Directives  Does Patient Have a Medical Advance Directive? Yes No Yes No No Yes Yes  Type of Forensic scientist of [place];Living will   Living will;Healthcare Power of [employer] Power of [place];Living will  Does patient want to make changes to medical advance directive?       No - Patient declined  Copy of Healthcare Power of Attorney in Chart? Yes - validated most recent copy scanned in chart (See row information)  No - copy requested   No - copy requested No - copy requested  Would patient like information on creating a medical advance directive?    No - Patient declined No - Patient declined      Current Medications (verified) Outpatient Encounter Medications as of 08/29/2022  Medication Sig   alendronate (FOSAMAX) 70 MG tablet TAKE 1 TABLET BY MOUTH ONE TIME PER WEEK   aspirin 81 MG EC tablet Take by mouth.   atorvastatin (LIPITOR) 40 MG  tablet Take 1 tablet (40 mg total) by mouth daily.   Cholecalciferol 50 MCG (2000 UT) CAPS TAKE 2 CAPSULES BY MOUTH EVERY DAY   levothyroxine (SYNTHROID) 75 MCG tablet TAKE 1 TABLET BY MOUTH EVERY DAY   loratadine (CLARITIN) 10 MG tablet Take by mouth.   omeprazole (PRILOSEC) 40 MG capsule TAKE 1 CAPSULE (40 MG TOTAL) BY MOUTH DAILY.   oxybutynin (DITROPAN-XL) 10 MG 24 hr tablet TAKE 1 TABLET BY MOUTH EVERY DAY   sertraline (ZOLOFT) 25 MG tablet TAKE 1 TABLET (25 MG TOTAL) BY MOUTH DAILY.   No facility-administered encounter medications on file as of 08/29/2022.    Allergies (verified) Patient has no known allergies.   History: Past Medical History:  Diagnosis Date   Allergy    GERD (gastroesophageal reflux disease)    History of chicken pox    Hyperlipidemia    Scoliosis    Thyroid disease    Past Surgical History:  Procedure Laterality Date   ABDOMINAL HYSTERECTOMY  1980   ADENOIDECTOMY     HERNIA REPAIR  02/04/1966   REPLACEMENT TOTAL KNEE Right    TONSILLECTOMY     WRIST FRACTURE SURGERY Bilateral    Family History  Problem Relation Age of Onset   Allergic rhinitis Neg Hx    Angioedema Neg Hx    Asthma Neg Hx    Atopy Neg Hx  Eczema Neg Hx    Immunodeficiency Neg Hx    Urticaria Neg Hx    Social History   Socioeconomic History   Marital status: Widowed    Spouse name: Not on file   Number of children: Not on file   Years of education: Not on file   Highest education level: Associate degree: occupational, Scientist, product/process development, or vocational program  Occupational History   Not on file  Tobacco Use   Smoking status: Never   Smokeless tobacco: Never  Vaping Use   Vaping status: Never Used  Substance and Sexual Activity   Alcohol use: Yes    Comment: occasionally   Drug use: Never   Sexual activity: Not Currently  Other Topics Concern   Not on file  Social History Narrative   Not on file   Social Determinants of Health   Financial Resource Strain: Low Risk   (08/29/2022)   Overall Financial Resource Strain (CARDIA)    Difficulty of Paying Living Expenses: Not hard at all  Food Insecurity: No Food Insecurity (08/29/2022)   Hunger Vital Sign    Worried About Running Out of Food in the Last Year: Never true    Ran Out of Food in the Last Year: Never true  Transportation Needs: No Transportation Needs (08/29/2022)   PRAPARE - Administrator, Civil Service (Medical): No    Lack of Transportation (Non-Medical): No  Physical Activity: Insufficiently Active (08/29/2022)   Exercise Vital Sign    Days of Exercise per Week: 1 day    Minutes of Exercise per Session: 10 min  Stress: No Stress Concern Present (08/29/2022)   [employer] of Occupational Health - Occupational Stress Questionnaire    Feeling of Stress : Not at all  Social Connections: Moderately Isolated (08/29/2022)   Social Connection and Isolation Panel [NHANES]    Frequency of Communication with Friends and Family: More than three times a week    Frequency of Social Gatherings with Friends and Family: More than three times a week    Attends Religious Services: Never    Database administrator or Organizations: Yes    Attends Engineer, structural: More than 4 times per year    Marital Status: Widowed    Tobacco Counseling Counseling given: Not Answered   Clinical Intake:  Pre-visit preparation completed: Yes  Pain : No/denies pain     Diabetes: No  How often do you need to have someone help you when you read instructions, pamphlets, or other written materials from your doctor or pharmacy?: 1 - Never  Interpreter Needed?: No  Information entered by :: Remi Haggard LPN   Activities of Daily Living    08/29/2022    2:05 PM  In your present state of health, do you have any difficulty performing the following activities:  Hearing? 0  Vision? 1  Difficulty concentrating or making decisions? 0  Walking or climbing stairs? 0  Dressing or bathing? 0   Doing errands, shopping? 0  Preparing Food and eating ? N  Using the Toilet? N  In the past six months, have you accidently leaked urine? Y  Do you have problems with loss of bowel control? N  Managing your Medications? N  Managing your Finances? N  Housekeeping or managing your Housekeeping? N    Patient Care Team: Sheliah Hatch, MD as PCP - General (Family Medicine)  Indicate any recent Medical Services you may have received from other than [facility] providers in the  past year (date may be approximate).     Assessment:   This is a routine wellness examination for [place].  Hearing/Vision screen Hearing Screening - Comments:: No trouble hearing Vision Screening - Comments:: Up to date [facility]  Dietary issues and exercise activities discussed:     Goals Addressed             This Visit's Progress    Patient Stated       Travel again        Depression Screen    08/29/2022    2:09 PM 06/20/2022    8:31 AM 11/05/2021    9:10 AM 08/23/2021    2:13 PM 08/23/2021    2:11 PM 08/23/2021    2:08 PM 06/01/2021    9:58 AM  PHQ 2/9 Scores  PHQ - 2 Score 0 0 0 0 0 0 0  PHQ- 9 Score 0 0 0    0    Fall Risk    08/29/2022    2:03 PM 06/20/2022    8:31 AM 11/05/2021    9:11 AM 08/23/2021    2:13 PM 06/01/2021    9:57 AM  Fall Risk   Falls in the past year? 1 1 1  0 0  Number falls in past yr: 0 0 0 0 0  Injury with Fall? 1 1 0 0 0  Risk for fall due to :  History of fall(s) No Fall Risks No Fall Risks No Fall Risks  Follow up Falls evaluation completed;Education provided;Falls prevention discussed Falls evaluation completed Falls evaluation completed Falls evaluation completed;Education provided Falls evaluation completed    MEDICARE RISK AT HOME:  Medicare Risk at Home - 08/29/22 1403     Any stairs in or around the home? No    If so, are there any without handrails? No    Home free of loose throw rugs in walkways, pet beds, electrical cords, etc? Yes     Adequate lighting in your home to reduce risk of falls? Yes    Life alert? No    Use of a cane, walker or w/c? No    Grab bars in the bathroom? Yes    Shower chair or bench in shower? Yes    Elevated toilet seat or a handicapped toilet? Yes             TIMED UP AND GO:  Was the test performed?  No    Cognitive Function:        06/01/2021    9:59 AM 08/03/2019   10:41 AM  6CIT Screen  What Year? 0 points 0 points  What month? 0 points 0 points  What time? 0 points 0 points  Count back from 20 0 points 0 points  Months in reverse 0 points 0 points  Repeat phrase 0 points 0 points  Total Score 0 points 0 points    Immunizations Immunization History  Administered Date(s) Administered   Fluad Quad(high Dose 65+) 12/04/2019, 12/01/2020, 10/29/2021   H1N1 03/01/2008   Influenza, High Dose Seasonal PF 11/17/2011, 11/25/2014, 11/21/2015, 11/20/2016   Influenza-Unspecified 12/21/1998, 11/16/2003, 11/26/2004, 11/19/2005, 11/25/2006, 03/01/2008, 10/05/2008   Moderna Sars-Covid-2 Vaccination 02/16/2019, 03/17/2019, 12/22/2019, 11/26/2021   PNEUMOCOCCAL CONJUGATE-20 11/05/2021   Pneumococcal Conjugate-13 12/14/2014   Pneumococcal Polysaccharide-23 12/21/1998, 11/11/2008   Rsv, Bivalent, Protein Subunit Rsvpref,pf Verdis Frederickson) 03/18/2022   Td 02/17/2007   Tdap 12/16/2008, 08/08/2020   Unspecified SARS-COV-2 Vaccination 11/26/2021   Zoster Recombinant(Shingrix) 04/09/2005, 08/08/2020   Zoster, Live 04/09/2005  TDAP status: Up to date  Flu Vaccine status: Up to date  Pneumococcal vaccine status: Up to date  Covid-19 vaccine status: Information provided on how to obtain vaccines.   Qualifies for Shingles Vaccine? No   Zostavax completed Yes   Shingrix Completed?: Yes  Screening Tests Health Maintenance  Topic Date Due   INFLUENZA VACCINE  09/05/2022   Medicare Annual Wellness (AWV)  08/29/2023   DTaP/Tdap/Td (4 - Td or Tdap) 08/09/2030   Pneumonia Vaccine 65+ Years  old  Completed   DEXA SCAN  Completed   Zoster Vaccines- Shingrix  Completed   HPV VACCINES  Aged Out   COVID-19 Vaccine  Discontinued    Health Maintenance  There are no preventive care reminders to display for this patient.   Colorectal cancer screening: No longer required.   Mammogram status: No longer required due to age.  Bone Density status: Ordered  . Pt provided with contact info and advised to call to schedule appt.  Lung Cancer Screening: (Low Dose CT Chest recommended if Age 13-80 years, 20 pack-year currently smoking OR have quit w/in 15years.) does not qualify.   Lung Cancer Screening Referral:   Additional Screening:  Hepatitis C Screening: does not qualify;   Vision Screening: Recommended annual ophthalmology exams for early detection of glaucoma and other disorders of the eye. Is the patient up to date with their annual eye exam?  Yes  Who is the provider or what is the name of the office in which the patient attends annual eye exams? [facility]  - travels to doctor that has been treating her double vision If pt is not established with a provider, would they like to be referred to a provider to establish care? No .   Dental Screening: Recommended annual dental exams for proper oral hygiene    Community Resource Referral / Chronic Care Management: CRR required this visit?  No   CCM required this visit?  No     Plan:     I have personally reviewed and noted the following in the patient's chart:   Medical and social history Use of alcohol, tobacco or illicit drugs  Current medications and supplements including opioid prescriptions. Patient is not currently taking opioid prescriptions. Functional ability and status Nutritional status Physical activity Advanced directives List of other physicians Hospitalizations, surgeries, and ER visits in previous 12 months Vitals Screenings to include cognitive, depression, and falls Referrals and  appointments  In addition, I have reviewed and discussed with patient certain preventive protocols, quality metrics, and best practice recommendations. A written personalized care plan for preventive services as well as general preventive health recommendations were provided to patient.     Remi Haggard, LPN   [id number]/41/[id number]   After Visit Summary: (MyChart) Due to this being a telephonic visit, the after visit summary with patients personalized plan was offered to patient via MyChart   Nurse Notes:

## 2022-08-29 NOTE — Patient Instructions (Signed)
Sabrina Castaneda , Thank you for taking time to come for your Medicare Wellness Visit. I appreciate your ongoing commitment to your health goals. Please review the following plan we discussed and let me know if I can assist you in the future.   Screening recommendations/referrals: Colonoscopy: no longer required Mammogram: no longer required Bone Density: Education provided Recommended yearly ophthalmology/optometry visit for glaucoma screening and checkup Recommended yearly dental visit for hygiene and checkup  Vaccinations: Influenza vaccine: up to date Pneumococcal vaccine: up to date Tdap vaccine: up to date Shingles vaccine: up to date   Advanced directives: yes on file     Preventive Care 85 Years and Older, Female Preventive care refers to lifestyle choices and visits with your health care provider that can promote health and wellness. What does preventive care include? A yearly physical exam. This is also called an annual well check. Dental exams once or twice a year. Routine eye exams. Ask your health care provider how often you should have your eyes checked. Personal lifestyle choices, including: Daily care of your teeth and gums. Regular physical activity. Eating a healthy diet. Avoiding tobacco and drug use. Limiting alcohol use. Practicing safe sex. Taking low-dose aspirin every day. Taking vitamin and mineral supplements as recommended by your health care provider. What happens during an annual well check? The services and screenings done by your health care provider during your annual well check will depend on your age, overall health, lifestyle risk factors, and family history of disease. Counseling  Your health care provider may ask you questions about your: Alcohol use. Tobacco use. Drug use. Emotional well-being. Home and relationship well-being. Sexual activity. Eating habits. History of falls. Memory and ability to understand (cognition). Work and work  Astronomer. Reproductive health. Screening  You may have the following tests or measurements: Height, weight, and BMI. Blood pressure. Lipid and cholesterol levels. These may be checked every 5 years, or more frequently if you are over 3 years old. Skin check. Lung cancer screening. You may have this screening every year starting at age 85 if you have a 30-pack-year history of smoking and currently smoke or have quit within the past 15 years. Fecal occult blood test (FOBT) of the stool. You may have this test every year starting at age 41. Flexible sigmoidoscopy or colonoscopy. You may have a sigmoidoscopy every 5 years or a colonoscopy every 10 years starting at age 40. Hepatitis C blood test. Hepatitis B blood test. Sexually transmitted disease (STD) testing. Diabetes screening. This is done by checking your blood sugar (glucose) after you have not eaten for a while (fasting). You may have this done every 1-3 years. Bone density scan. This is done to screen for osteoporosis. You may have this done starting at age 8. Mammogram. This may be done every 1-2 years. Talk to your health care provider about how often you should have regular mammograms. Talk with your health care provider about your test results, treatment options, and if necessary, the need for more tests. Vaccines  Your health care provider may recommend certain vaccines, such as: Influenza vaccine. This is recommended every year. Tetanus, diphtheria, and acellular pertussis (Tdap, Td) vaccine. You may need a Td booster every 10 years. Zoster vaccine. You may need this after age 49. Pneumococcal 13-valent conjugate (PCV13) vaccine. One dose is recommended after age 33. Pneumococcal polysaccharide (PPSV23) vaccine. One dose is recommended after age 38. Talk to your health care provider about which screenings and vaccines you need and how  often you need them. This information is not intended to replace advice given to you by  your health care provider. Make sure you discuss any questions you have with your health care provider. Document Released: 02/17/2015 Document Revised: 10/11/2015 Document Reviewed: 11/22/2014 Elsevier Interactive Patient Education  2017 [employer].  Fall Prevention in the Home Falls can cause injuries. They can happen to people of all ages. There are many things you can do to make your home safe and to help prevent falls. What can I do on the outside of my home? Regularly fix the edges of walkways and driveways and fix any cracks. Remove anything that might make you trip as you walk through a door, such as a raised step or threshold. Trim any bushes or trees on the path to your home. Use bright outdoor lighting. Clear any walking paths of anything that might make someone trip, such as rocks or tools. Regularly check to see if handrails are loose or broken. Make sure that both sides of any steps have handrails. Any raised decks and porches should have guardrails on the edges. Have any leaves, snow, or ice cleared regularly. Use sand or salt on walking paths during winter. Clean up any spills in your garage right away. This includes oil or grease spills. What can I do in the bathroom? Use night lights. Install grab bars by the toilet and in the tub and shower. Do not use towel bars as grab bars. Use non-skid mats or decals in the tub or shower. If you need to sit down in the shower, use a plastic, non-slip stool. Keep the floor dry. Clean up any water that spills on the floor as soon as it happens. Remove soap buildup in the tub or shower regularly. Attach bath mats securely with double-sided non-slip rug tape. Do not have throw rugs and other things on the floor that can make you trip. What can I do in the bedroom? Use night lights. Make sure that you have a light by your bed that is easy to reach. Do not use any sheets or blankets that are too big for your bed. They should not hang  down onto the floor. Have a firm chair that has side arms. You can use this for support while you get dressed. Do not have throw rugs and other things on the floor that can make you trip. What can I do in the kitchen? Clean up any spills right away. Avoid walking on wet floors. Keep items that you use a lot in easy-to-reach places. If you need to reach something above you, use a strong step stool that has a grab bar. Keep electrical cords out of the way. Do not use floor polish or wax that makes floors slippery. If you must use wax, use non-skid floor wax. Do not have throw rugs and other things on the floor that can make you trip. What can I do with my stairs? Do not leave any items on the stairs. Make sure that there are handrails on both sides of the stairs and use them. Fix handrails that are broken or loose. Make sure that handrails are as long as the stairways. Check any carpeting to make sure that it is firmly attached to the stairs. Fix any carpet that is loose or worn. Avoid having throw rugs at the top or bottom of the stairs. If you do have throw rugs, attach them to the floor with carpet tape. Make sure that you have a  light switch at the top of the stairs and the bottom of the stairs. If you do not have them, ask someone to add them for you. What else can I do to help prevent falls? Wear shoes that: Do not have high heels. Have rubber bottoms. Are comfortable and fit you well. Are closed at the toe. Do not wear sandals. If you use a stepladder: Make sure that it is fully opened. Do not climb a closed stepladder. Make sure that both sides of the stepladder are locked into place. Ask someone to hold it for you, if possible. Clearly mark and make sure that you can see: Any grab bars or handrails. First and last steps. Where the edge of each step is. Use tools that help you move around (mobility aids) if they are needed. These  include: Canes. Walkers. Scooters. Crutches. Turn on the lights when you go into a dark area. Replace any light bulbs as soon as they burn out. Set up your furniture so you have a clear path. Avoid moving your furniture around. If any of your floors are uneven, fix them. If there are any pets around you, be aware of where they are. Review your medicines with your doctor. Some medicines can make you feel dizzy. This can increase your chance of falling. Ask your doctor what other things that you can do to help prevent falls. This information is not intended to replace advice given to you by your health care provider. Make sure you discuss any questions you have with your health care provider. Document Released: 11/17/2008 Document Revised: 06/29/2015 Document Reviewed: 02/25/2014 Elsevier Interactive Patient Education  2017 [employer].

## 2022-08-30 IMAGING — US US EXTREM LOW VENOUS*L*
1 series · 14 of 24 positions shown · non-contrast
Comparison: None.

CLINICAL DATA: Left leg pain for 5 days

EXAM:
LEFT LOWER EXTREMITY VENOUS DOPPLER ULTRASOUND
TECHNIQUE: Gray-scale sonography with compression, as well as color and duplex
ultrasound, were performed to evaluate the deep venous system(s)
from the level of the common femoral vein through the popliteal and
proximal calf veins.

[Series 1: us venous img lower uni left (dvt) · portal-venous · 14 of 45 slices shown]
[im 1/45]
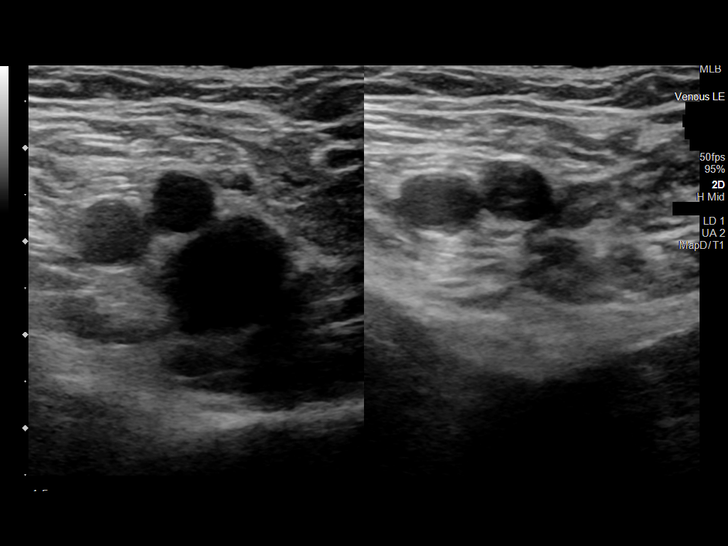
[im 4/45]
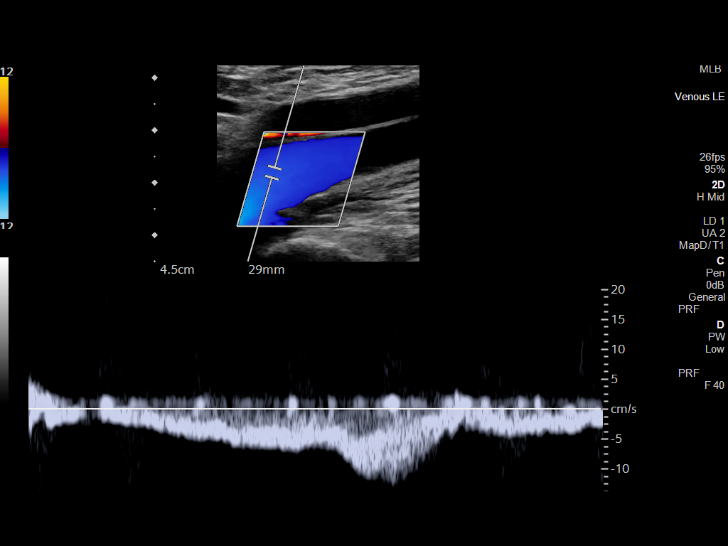
[im 8/45]
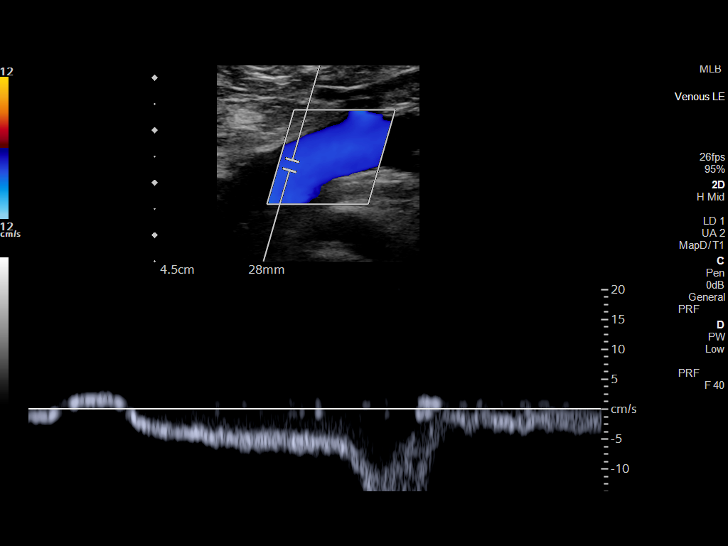
[im 12/45]
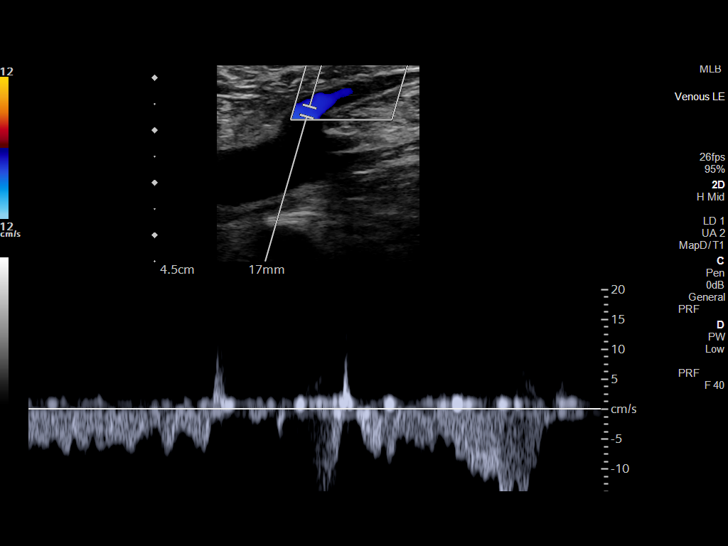
[im 14/45]
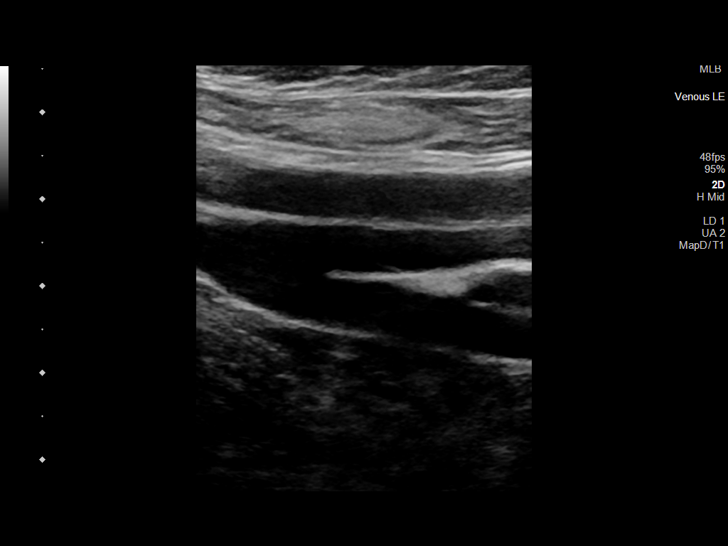
[im 18/45]
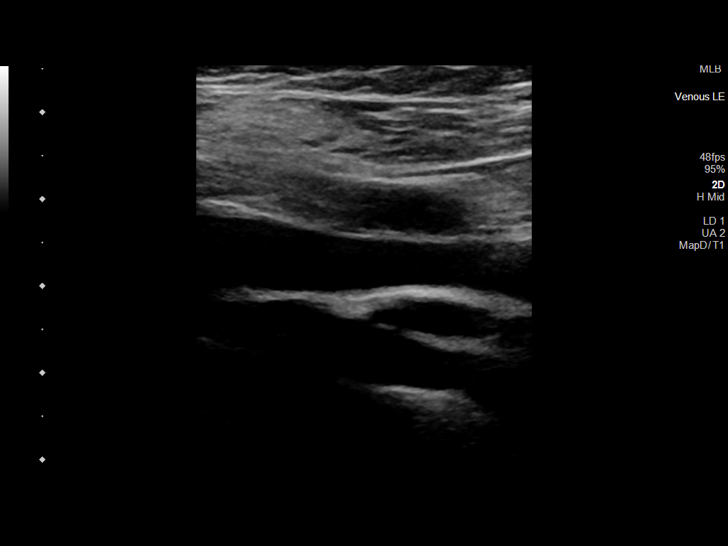
[im 22/45]
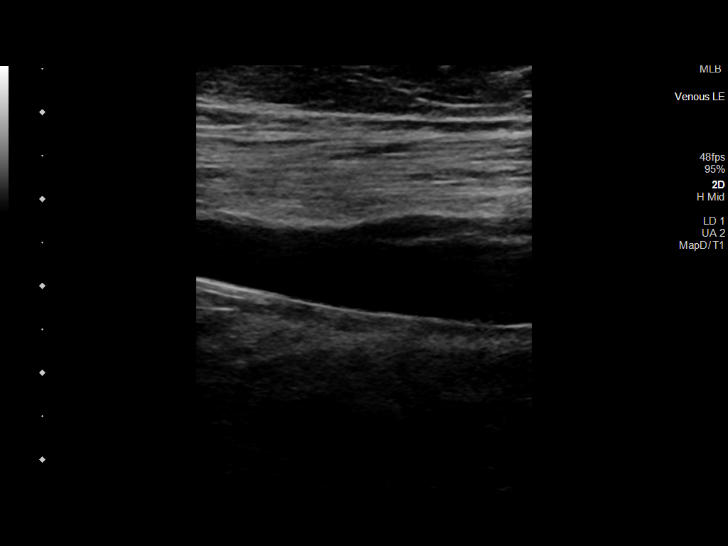
[im 23/45]
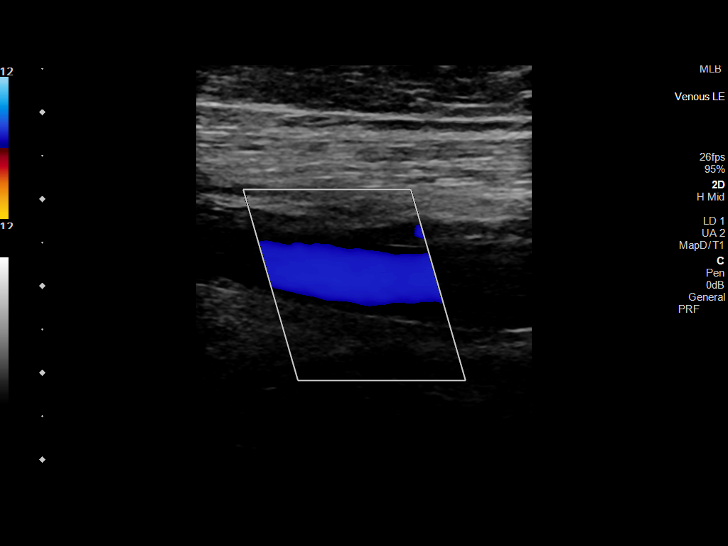
[im 27/45]
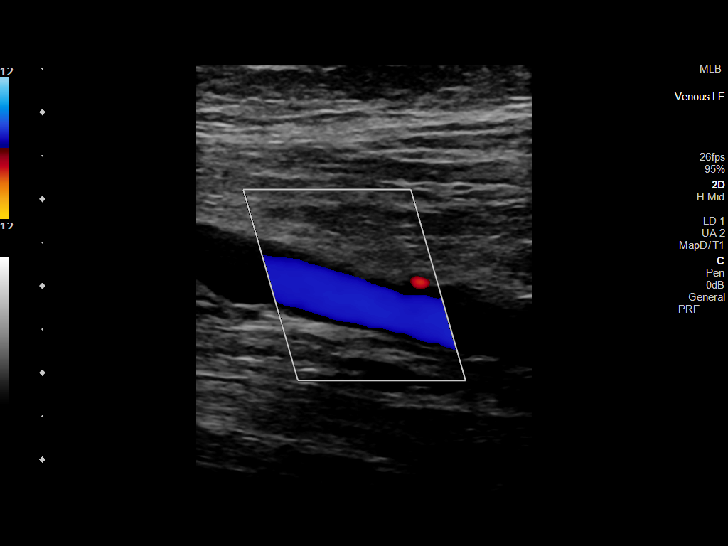
[im 31/45]
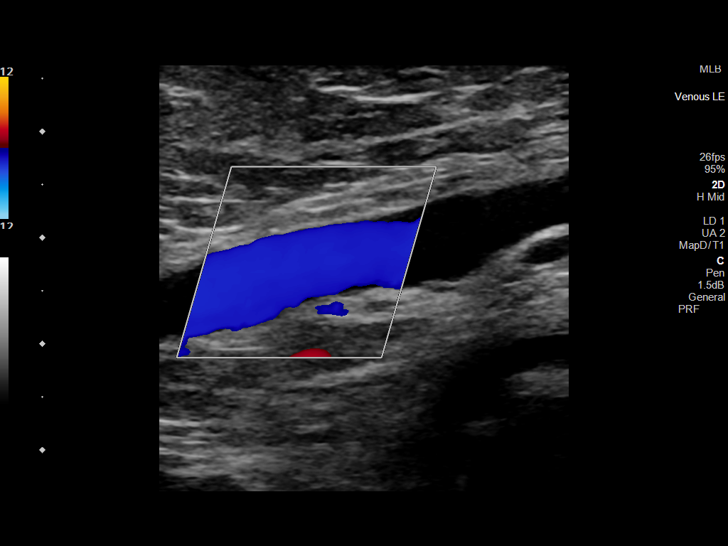
[im 35/45]
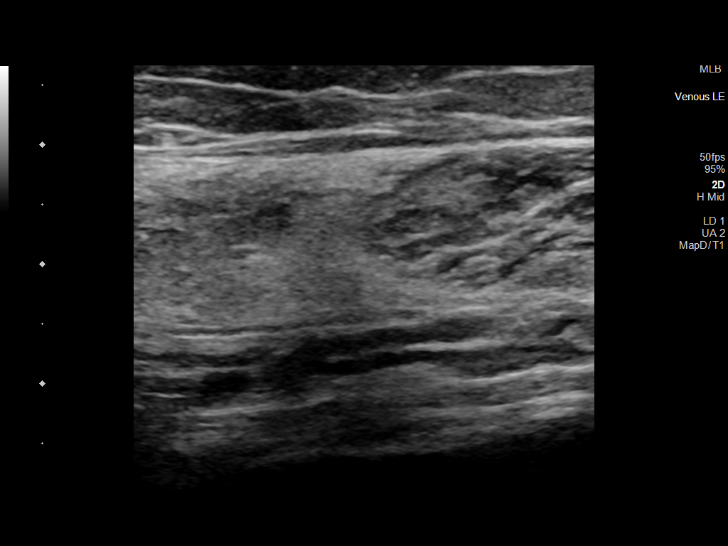
[im 37/45]
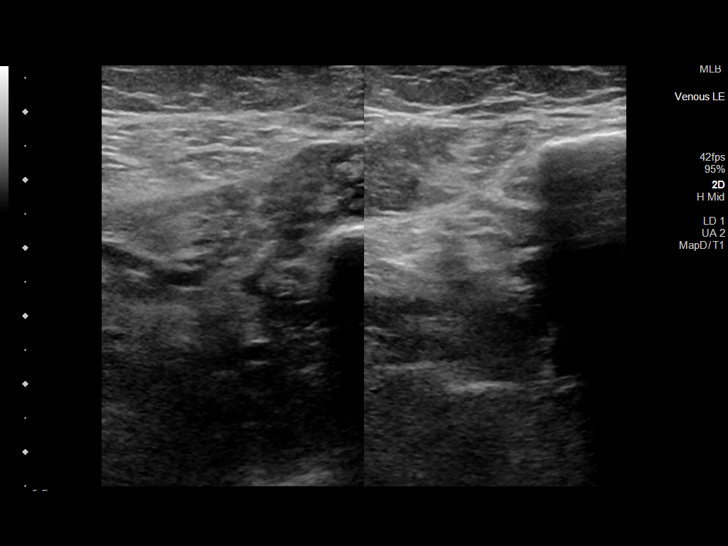
[im 41/45]
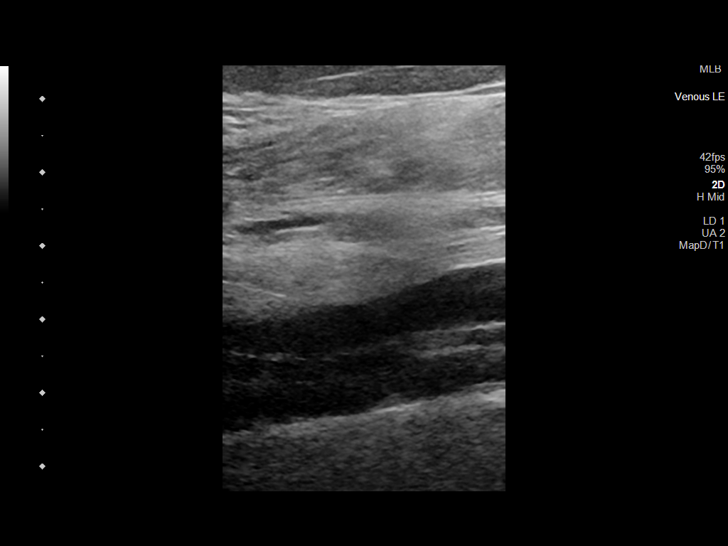
[im 45/45]
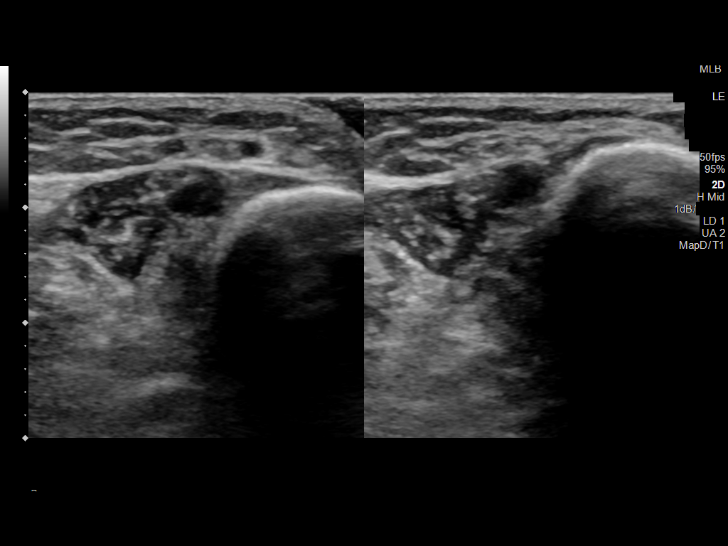

[14 of 24 positions shown; findings below may reference images not displayed]

FINDINGS: VENOUS

Normal compressibility of the common femoral, superficial femoral,
and popliteal veins, as well as the visualized calf veins.
Visualized portions of profunda femoral vein and great saphenous
vein unremarkable. No filling defects to suggest DVT on grayscale or
color Doppler imaging. Doppler waveforms show normal direction of
venous flow, normal respiratory plasticity and response to
augmentation.

Limited views of the contralateral common femoral vein are
unremarkable.

OTHER

None.

Limitations: none
IMPRESSION: Negative.

## 2022-09-06 IMAGING — DX DG FOREARM 2V*R*
2 series · 2 of 2 positions shown · non-contrast
Comparison: None.

CLINICAL DATA: Dog bite to the distal forearm

EXAM:
RIGHT FOREARM - 2 VIEW

[forearm ap]
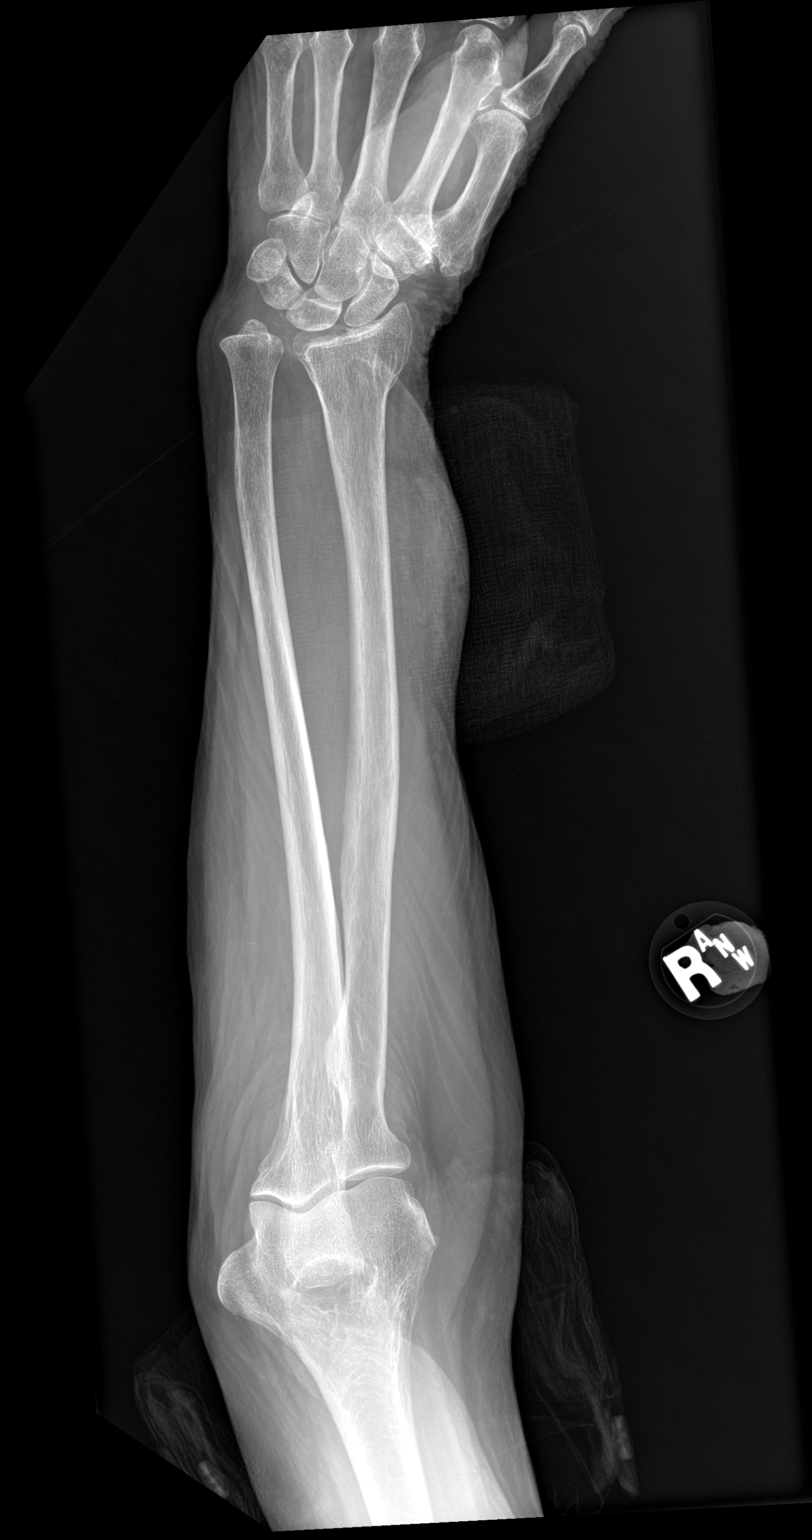

[forearm lat]
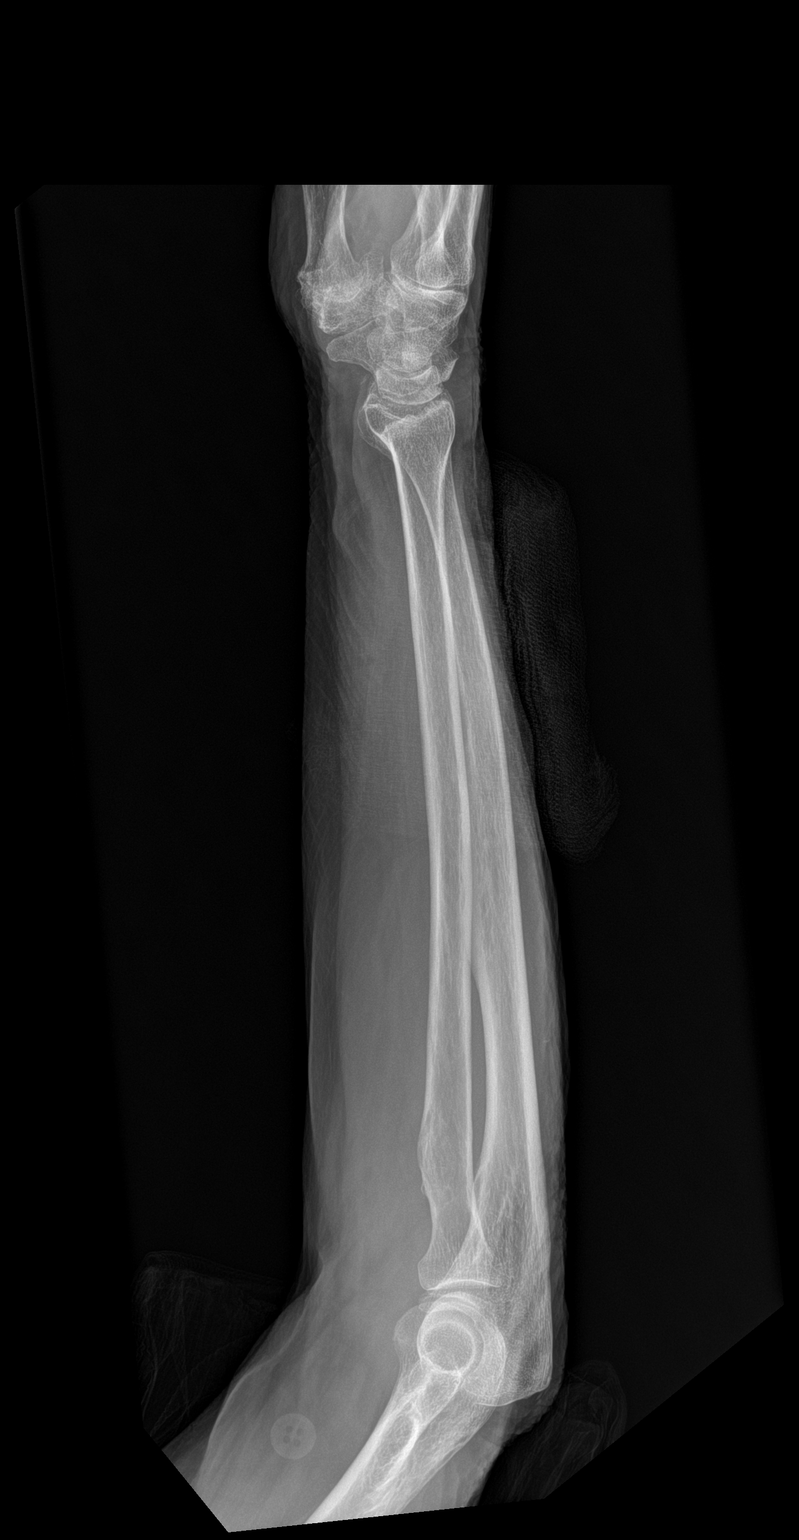

[2 of 2 positions shown; findings below may reference images not displayed]

FINDINGS: Soft tissue swelling and deformity in the region. No evidence of
fracture or radiopaque foreign object.
IMPRESSION: No fracture or radiopaque foreign object. Soft tissue
swelling/deformity in the region of the dog bite

## 2022-10-24 ENCOUNTER — Ambulatory Visit (INDEPENDENT_AMBULATORY_CARE_PROVIDER_SITE_OTHER): Payer: Medicare HMO | Admitting: Family Medicine

## 2022-10-24 DIAGNOSIS — Z23 Encounter for immunization: Secondary | ICD-10-CM

## 2022-10-24 NOTE — Progress Notes (Signed)
Pt was give Flu Shot today in Rt Deltoid No concerns reports

## 2022-11-05 ENCOUNTER — Other Ambulatory Visit: Payer: Self-pay | Admitting: Family Medicine

## 2022-11-18 DIAGNOSIS — M419 Scoliosis, unspecified: Secondary | ICD-10-CM | POA: Insufficient documentation

## 2022-11-18 DIAGNOSIS — M5412 Radiculopathy, cervical region: Secondary | ICD-10-CM | POA: Insufficient documentation

## 2022-12-06 ENCOUNTER — Other Ambulatory Visit: Payer: Self-pay | Admitting: Family Medicine

## 2022-12-09 DIAGNOSIS — M542 Cervicalgia: Secondary | ICD-10-CM | POA: Insufficient documentation

## 2022-12-12 ENCOUNTER — Ambulatory Visit: Payer: Medicare HMO | Admitting: Family Medicine

## 2022-12-12 ENCOUNTER — Other Ambulatory Visit: Payer: Self-pay | Admitting: Family Medicine

## 2022-12-12 ENCOUNTER — Encounter: Payer: Self-pay | Admitting: Family Medicine

## 2022-12-12 VITALS — BP 144/88 | HR 88 | Temp 98.3°F | Ht 62.0 in | Wt 151.1 lb

## 2022-12-12 DIAGNOSIS — Z0000 Encounter for general adult medical examination without abnormal findings: Secondary | ICD-10-CM

## 2022-12-12 DIAGNOSIS — E785 Hyperlipidemia, unspecified: Secondary | ICD-10-CM | POA: Diagnosis not present

## 2022-12-12 LAB — LIPID PANEL
Cholesterol: 201 mg/dL — ABNORMAL HIGH (ref 0–200)
HDL: 63 mg/dL (ref >39.00)
LDL Cholesterol: 112 mg/dL — ABNORMAL HIGH (ref 0–99)
NonHDL: 137.52
Total CHOL/HDL Ratio: 3
Triglycerides: 126 mg/dL (ref 0.0–149.0)
VLDL: 25.2 mg/dL (ref 0.0–40.0)

## 2022-12-12 LAB — CBC WITH DIFFERENTIAL/PLATELET
Basophils Absolute: 0 10*3/uL (ref 0.0–0.1)
Basophils Relative: 0.6 % (ref 0.0–3.0)
Eosinophils Absolute: 0.1 10*3/uL (ref 0.0–0.7)
Eosinophils Relative: 1.4 % (ref 0.0–5.0)
HCT: 41.3 % (ref 36.0–46.0)
Hemoglobin: 13.9 g/dL (ref 12.0–15.0)
Lymphocytes Relative: 23.5 % (ref 12.0–46.0)
Lymphs Abs: 1.5 10*3/uL (ref 0.7–4.0)
MCHC: 33.7 g/dL (ref 30.0–36.0)
MCV: 88.1 fL (ref 78.0–100.0)
Monocytes Absolute: 0.6 10*3/uL (ref 0.1–1.0)
Monocytes Relative: 10.1 % (ref 3.0–12.0)
Neutro Abs: 4.1 10*3/uL (ref 1.4–7.7)
Neutrophils Relative %: 64.4 % (ref 43.0–77.0)
Platelets: 327 10*3/uL (ref 150.0–400.0)
RBC: 4.69 Mil/uL (ref 3.87–5.11)
RDW: 13.2 % (ref 11.5–15.5)
WBC: 6.4 10*3/uL (ref 4.0–10.5)

## 2022-12-12 LAB — HEPATIC FUNCTION PANEL
ALT: 15 U/L (ref 0–35)
AST: 17 U/L (ref 0–37)
Albumin: 4.2 g/dL (ref 3.5–5.2)
Alkaline Phosphatase: 66 U/L (ref 39–117)
Bilirubin, Direct: 0.1 mg/dL (ref 0.0–0.3)
Total Bilirubin: 0.9 mg/dL (ref 0.2–1.2)
Total Protein: 6.8 g/dL (ref 6.0–8.3)

## 2022-12-12 LAB — BASIC METABOLIC PANEL
BUN: 11 mg/dL (ref 6–23)
CO2: 26 meq/L (ref 19–32)
Calcium: 9.4 mg/dL (ref 8.4–10.5)
Chloride: 100 meq/L (ref 96–112)
Creatinine, Ser: 0.67 mg/dL (ref 0.40–1.20)
GFR: 79.86 mL/min (ref >60.00)
Glucose, Bld: 96 mg/dL (ref 70–99)
Potassium: 4.2 meq/L (ref 3.5–5.1)
Sodium: 132 meq/L — ABNORMAL LOW (ref 135–145)

## 2022-12-12 LAB — TSH: TSH: 1.01 u[IU]/mL (ref 0.35–5.50)

## 2022-12-12 NOTE — Patient Instructions (Signed)
Follow up in 6 months to recheck cholesterol We'll notify you of your lab results and make any changes if needed Keep up the good work on healthy diet and regular exercise- you look great! Call with any questions or concerns Stay Safe!  Stay Healthy! Happy Fall!!! 

## 2022-12-12 NOTE — Progress Notes (Signed)
   Subjective:    Patient ID: Sabrina Castaneda, female    DOB: 10/05/1935, 87 y.o.   MRN: [id number]  HPI CPE- UTD on Tdap, PNA.  Going to water aerobics regularly  Patient Care Team    Relationship Specialty Notifications Start End  Sheliah Hatch, MD PCP - General Family Medicine  05/13/19     Health Maintenance  Topic Date Due   COVID-19 Vaccine (5 - 2023-24 season) 10/06/2022   Medicare Annual Wellness (AWV)  08/29/2023   DTaP/Tdap/Td (4 - Td or Tdap) 08/09/2030   Pneumonia Vaccine 37+ Years old  Completed   INFLUENZA VACCINE  Completed   DEXA SCAN  Completed   Zoster Vaccines- Shingrix  Completed   HPV VACCINES  Aged Out      Review of Systems Patient reports no vision/ hearing changes, adenopathy,fever, weight change,  persistant/recurrent hoarseness, swallowing issues, chest pain, palpitations, edema, persistant/recurrent cough, hemoptysis, gastrointestinal bleeding (melena, rectal bleeding), abdominal pain, significant heartburn, bowel changes, GU symptoms (dysuria, hematuria, incontinence), Gyn symptoms (abnormal  bleeding, pain),  syncope, focal weakness, memory loss, numbness & tingling, skin/hair/nail changes, abnormal bruising or bleeding, anxiety, or depression.   + SOB due to scoliosis    Objective:   Physical Exam General Appearance:    Alert, cooperative, no distress, appears stated age  Head:    Normocephalic, without obvious abnormality, atraumatic  Eyes:    PERRL, conjunctiva/corneas clear, EOM's intact both eyes  Ears:    Normal TM's and external ear canals, both ears  Nose:   Nares normal, septum midline, mucosa normal, no drainage    or sinus tenderness  Throat:   Lips, mucosa, and tongue normal; teeth and gums normal  Neck:   Supple, symmetrical, trachea midline, no adenopathy;    Thyroid: no enlargement/tenderness/nodules  Back:     Notable scoliosis  Lungs:     Clear to auscultation bilaterally, respirations unlabored  Chest Wall:    No tenderness or  deformity   Heart:    Regular rate and rhythm, S1 and S2 normal, no murmur, rub   or gallop  Breast Exam:    Deferred  Abdomen:     Soft, non-tender, bowel sounds active all four quadrants,    no masses, no organomegaly  Genitalia:    Deferred  Rectal:    Extremities:   Extremities normal, atraumatic, no cyanosis or edema  Pulses:   2+ and symmetric all extremities  Skin:   Skin color, texture, turgor normal, no rashes or lesions  Lymph nodes:   Cervical, supraclavicular, and axillary nodes normal  Neurologic:   CNII-XII intact, normal strength, sensation and reflexes    throughout          Assessment & Plan:

## 2022-12-13 NOTE — Assessment & Plan Note (Signed)
Pt's PE WNL and unchanged from previous.  UTD on Tdap, PNA.  No longer doing mammograms.  Check labs.  Anticipatory guidance provided.

## 2022-12-16 ENCOUNTER — Encounter: Payer: Self-pay | Admitting: Family Medicine

## 2022-12-16 NOTE — Progress Notes (Signed)
Communicated via mychart

## 2023-01-22 ENCOUNTER — Other Ambulatory Visit: Payer: Self-pay | Admitting: Family Medicine

## 2023-02-25 ENCOUNTER — Telehealth: Payer: Self-pay | Admitting: Family Medicine

## 2023-02-25 NOTE — Telephone Encounter (Signed)
This has been retrieved by Tabori's CMA. This type of paper work/form does not need a phone note, nothing needs to be signed

## 2023-02-25 NOTE — Telephone Encounter (Signed)
Type of form received:[facility]   Additional comments: Prescriber Services- Levothyroxine Recall Info   Received ZO:XWRUEAV- Front Desk   Form should be Faxed/mailed to: N/A  Is patient requesting call for pickup:N/A  Form placed: [employer] charge sheet.  Provider will determine charge. N/A  Individual made aware of 3-5 business day turn around No?

## 2023-03-02 ENCOUNTER — Other Ambulatory Visit: Payer: Self-pay | Admitting: Family Medicine

## 2023-03-11 DIAGNOSIS — M65332 Trigger finger, left middle finger: Secondary | ICD-10-CM | POA: Insufficient documentation

## 2023-03-27 ENCOUNTER — Ambulatory Visit: Payer: Medicare HMO | Admitting: Family Medicine

## 2023-03-27 ENCOUNTER — Ambulatory Visit: Payer: Self-pay | Admitting: Family Medicine

## 2023-03-27 VITALS — BP 134/70 | HR 79 | Temp 98.1°F | Ht 62.0 in | Wt 150.4 lb

## 2023-03-27 DIAGNOSIS — R300 Dysuria: Secondary | ICD-10-CM

## 2023-03-27 LAB — POCT URINALYSIS DIPSTICK
Bilirubin, UA: NEGATIVE
Blood, UA: POSITIVE
Glucose, UA: NEGATIVE
Ketones, UA: NEGATIVE
Leukocytes, UA: NEGATIVE
Nitrite, UA: NEGATIVE
Protein, UA: NEGATIVE
Spec Grav, UA: <=1.005 — AB (ref 1.010–1.025)
Urobilinogen, UA: 0.2 U/dL
pH, UA: 6.5 (ref 5.0–8.0)

## 2023-03-27 MED ORDER — CEPHALEXIN 500 MG PO CAPS: 500.0000 mg | ORAL_CAPSULE | Freq: Two times a day (BID) | ORAL | 2 refills | Status: DC

## 2023-03-27 NOTE — Patient Instructions (Signed)
 Follow up as needed or as scheduled START the Cephalexin twice daily Drink LOTS of fluids Refill the medication as needed when you develop symptoms Call with any questions or concerns Hang in there!!!

## 2023-03-27 NOTE — Telephone Encounter (Signed)
   Chief Complaint: urinary symptoms Symptoms: burning with urination, urinary frequency Frequency: yesterday Pertinent Negatives: Patient denies fever, visible blood in urine Disposition: [] ED /[] Urgent Care (no appt availability in office) / [x] Appointment(In office/virtual)/ []  [facility] [facility]/ [] Home Care/ [] Refused Recommended Disposition /[] [facility]/ []  Follow-up with PCP Additional Notes: Patient reports that she has been having burning with urination and urinary frequency since yesterday. Patient reports she does have a history of frequent UTI's, though states it has been "about a year" since her last one. Per protocol, appt scheduled today 2/20. Patient advised to call back with worsening symptoms. Patient verbalized understanding.   Copied from CRM [id number]. Topic: Clinical - Red Word Triage >> Mar 27, 2023  8:11 AM Theodis Sato wrote: Red Word that prompted transfer to Nurse Triage: Burning/ pain from urinary tract infection Reason for Disposition  Urinating more frequently than usual (i.e., frequency)  Answer Assessment - Initial Assessment Questions 1. SYMPTOM: "What's the main symptom you're concerned about?" (e.g., frequency, incontinence)     Burning with urination and frequency 2. ONSET: "When did the burning start?"     yesterday 3. PAIN: "Is there any pain?" If Yes, ask: "How bad is it?" (Scale: 1-10; mild, moderate, severe)     Burning mild 4. CAUSE: "What do you think is causing the symptoms?"     UTI 5. OTHER SYMPTOMS: "Do you have any other symptoms?" (e.g., blood in urine, fever, flank pain, pain with urination)     none  Protocols used: Urinary Symptoms-A-AH

## 2023-03-27 NOTE — Progress Notes (Signed)
   Subjective:    Patient ID: Sabrina Castaneda, female    DOB: 10-04-1935, 88 y.o.   MRN: [id number]  HPI Dysuria- sxs started yesterday afternoon.  Had urge to urinate but could not void.  Now having frequency and urgency.  No fever.  Denies CVA tenderness.   Review of Systems For ROS see HPI     Objective:   Physical Exam Vitals reviewed.  Constitutional:      General: She is not in acute distress.    Appearance: Normal appearance. She is well-developed. She is not ill-appearing.  Abdominal:     General: There is no distension.     Palpations: Abdomen is soft.     Tenderness: There is no abdominal tenderness (no suprapubic or CVA tenderness).  Skin:    General: Skin is warm and dry.  Neurological:     General: No focal deficit present.     Mental Status: She is alert and oriented to person, place, and time.  Psychiatric:        Mood and Affect: Mood normal.        Behavior: Behavior normal.        Thought Content: Thought content normal.           Assessment & Plan:  Dysuria- new.  Pt has hx of UTIs but hasn't had one recently.  Is very familiar w/ her sxs.  UA is suspicious for infxn.  Start abx and provide refills for her to take prn.  Pt expressed understanding and is in agreement w/ plan.

## 2023-03-27 NOTE — Addendum Note (Signed)
 Addended by: Ester Rink on: 03/27/2023 03:42 PM   Modules accepted: Orders

## 2023-03-28 LAB — URINE CULTURE
MICRO NUMBER:: 16108806
SPECIMEN QUALITY:: ADEQUATE

## 2023-03-31 ENCOUNTER — Telehealth: Payer: Self-pay

## 2023-03-31 ENCOUNTER — Encounter: Payer: Self-pay | Admitting: Family Medicine

## 2023-03-31 NOTE — Telephone Encounter (Signed)
-----   Message from Neena Rhymes sent at 03/31/2023  7:42 AM EST ----- There was bacteria in your urine but no overwhelming evidence of infection.  Hopefully because we got it early!  Please let me know how you are feeling.

## 2023-03-31 NOTE — Telephone Encounter (Signed)
 I would love for pt to be seen as soon as possible but realize she doesn't drive.  She needs to stop the medication (if she hasn't already).  If she has any trouble swallowing, eating/drinking, talking, or speaking- she will need to go to the ER.  She should also take a Claritin or Zyrtec if she has it available to help stop any allergic reaction

## 2023-03-31 NOTE — Telephone Encounter (Signed)
 Pt has appt

## 2023-03-31 NOTE — Telephone Encounter (Signed)
 Noted. Agree.

## 2023-04-01 ENCOUNTER — Encounter: Payer: Self-pay | Admitting: Family Medicine

## 2023-04-01 ENCOUNTER — Ambulatory Visit: Payer: Medicare HMO | Admitting: Family Medicine

## 2023-04-01 VITALS — BP 128/68 | HR 74 | Temp 98.4°F | Wt 150.2 lb

## 2023-04-01 DIAGNOSIS — R220 Localized swelling, mass and lump, head: Secondary | ICD-10-CM

## 2023-04-01 NOTE — Patient Instructions (Addendum)
 Follow up as needed or as scheduled Use the Vaseline or Aquaphor on your lips until feeling better A cold washcloth or a little bit of ice will help with swelling Call with any questions or concerns Stay Safe!  Stay Healthy!

## 2023-04-01 NOTE — Progress Notes (Unsigned)
   Subjective:    Patient ID: Sabrina Castaneda, female    DOB: 05/07/1935, 88 y.o.   MRN: [id number]  HPI Lip swelling- pt reports feeling better today.  Sxs started Sunday.  'i thought I was getting a fever blister'.  Bottom lip got full and tight- split.  No trouble talking.  No trouble eating or drinking.  Lip is not as tight today.   Review of Systems For ROS see HPI     Objective:   Physical Exam Vitals reviewed.  Constitutional:      General: She is not in acute distress.    Appearance: Normal appearance. She is not ill-appearing.  HENT:     Head: Normocephalic and atraumatic.     Mouth/Throat:     Mouth: Mucous membranes are moist.     Pharynx: Oropharynx is clear. No oropharyngeal exudate or posterior oropharyngeal erythema.     Comments: No obvious swelling or cracking of lips Eyes:     Extraocular Movements: Extraocular movements intact.     Conjunctiva/sclera: Conjunctivae normal.  Cardiovascular:     Rate and Rhythm: Normal rate and regular rhythm.  Pulmonary:     Effort: Pulmonary effort is normal. No respiratory distress.  Musculoskeletal:     Cervical back: Neck supple.  Lymphadenopathy:     Cervical: No cervical adenopathy.  Skin:    General: Skin is warm and dry.     Findings: No erythema or rash.  Neurological:     General: No focal deficit present.     Mental Status: She is alert and oriented to person, place, and time.  Psychiatric:        Mood and Affect: Mood normal.        Behavior: Behavior normal.        Thought Content: Thought content normal.           Assessment & Plan:  Lip swelling- new.  Pt reports sxs were notable on Sunday and have improved since then.  She stopped the Keflex.  Will add to allergy list to avoid future reactions.  Encouraged use of Vaseline or Aquaphor to her lips to help w/ cracking and dryness w/o any additives or possible allergens.  Pt expressed understanding and is in agreement w/ plan.

## 2023-04-30 ENCOUNTER — Other Ambulatory Visit: Payer: Self-pay | Admitting: Family Medicine

## 2023-05-31 ENCOUNTER — Other Ambulatory Visit: Payer: Self-pay | Admitting: Family Medicine

## 2023-06-07 ENCOUNTER — Other Ambulatory Visit: Payer: Self-pay | Admitting: Family Medicine

## 2023-06-26 ENCOUNTER — Ambulatory Visit (INDEPENDENT_AMBULATORY_CARE_PROVIDER_SITE_OTHER): Admitting: Family Medicine

## 2023-06-26 ENCOUNTER — Encounter: Payer: Self-pay | Admitting: Family Medicine

## 2023-06-26 VITALS — BP 122/78 | HR 79 | Temp 97.5°F | Ht 62.5 in | Wt 150.8 lb

## 2023-06-26 DIAGNOSIS — R0981 Nasal congestion: Secondary | ICD-10-CM

## 2023-06-26 DIAGNOSIS — R519 Headache, unspecified: Secondary | ICD-10-CM | POA: Diagnosis not present

## 2023-06-26 LAB — POC COVID19 BINAXNOW: SARS Coronavirus 2 Ag: NEGATIVE

## 2023-06-26 MED ORDER — PREDNISONE 10 MG PO TABS: ORAL_TABLET | ORAL | 0 refills | Status: DC

## 2023-06-26 NOTE — Progress Notes (Signed)
   Subjective:    Patient ID: Sabrina Castaneda, female    DOB: Jun 07, 1935, 88 y.o.   MRN: [id number]  HPI Congestion- pt reports ongoing nasal congestion, + HA.  She has chronic neck pain s/p fusion that may be contributing to HA's.  No fever but slight chills.   + nose bleeds.  HA across frontal sinuses.  Minimal cough.  Taking OTC Claritin and Mucinex.   Review of Systems For ROS see HPI     Objective:   Physical Exam Vitals reviewed.  Constitutional:      General: She is not in acute distress.    Appearance: Normal appearance. She is not ill-appearing.  HENT:     Head: Normocephalic and atraumatic.     Right Ear: Tympanic membrane and ear canal normal.     Left Ear: Tympanic membrane and ear canal normal.     Nose: Congestion present. No rhinorrhea.     Comments: Mild TTP over frontal sinuses No TTP over maxillary sinuses    Mouth/Throat:     Mouth: Mucous membranes are moist.     Pharynx: Oropharynx is clear. No posterior oropharyngeal erythema.  Eyes:     Extraocular Movements: Extraocular movements intact.     Conjunctiva/sclera: Conjunctivae normal.  Cardiovascular:     Rate and Rhythm: Normal rate and regular rhythm.     Pulses: Normal pulses.  Pulmonary:     Effort: Pulmonary effort is normal. No respiratory distress.     Breath sounds: No wheezing or rhonchi.  Musculoskeletal:     Cervical back: Neck supple. No rigidity.  Lymphadenopathy:     Cervical: No cervical adenopathy.  Skin:    General: Skin is dry.  Neurological:     General: No focal deficit present.     Mental Status: She is alert and oriented to person, place, and time.  Psychiatric:        Mood and Affect: Mood normal.        Behavior: Behavior normal.        Thought Content: Thought content normal.          Assessment & Plan:  Frontal HA- new.  This seems to be related to sinus inflammation and not infxn.  Suspect the nose bleeds are due to dryness.  Pt does not need abx at this time but  will start a low dose prednisone  taper to improve headache and inflammation.  Pt expressed understanding and is in agreement w/ plan.

## 2023-06-26 NOTE — Patient Instructions (Signed)
 Follow up as needed or as scheduled START the Prednisone  as directed- 3 pills at the same time x3 days, then 2 pills at the same time x3 days, then 1 pill daily.  Take w/ food  CONTINUE the daily allergy medication Drink LOTS of fluids REST when needed Call with any questions or concerns Stay Safe!  Stay Healthy! Hang in there!!!

## 2023-07-06 ENCOUNTER — Other Ambulatory Visit: Payer: Self-pay | Admitting: Family Medicine

## 2023-07-06 DIAGNOSIS — M8000XA Age-related osteoporosis with current pathological fracture, unspecified site, initial encounter for fracture: Secondary | ICD-10-CM

## 2023-07-19 ENCOUNTER — Other Ambulatory Visit: Payer: Self-pay | Admitting: Family Medicine

## 2023-07-24 ENCOUNTER — Other Ambulatory Visit: Payer: Self-pay | Admitting: Family Medicine

## 2023-08-28 ENCOUNTER — Other Ambulatory Visit: Payer: Self-pay | Admitting: Family Medicine

## 2023-09-25 NOTE — Therapy (Signed)
 OUTPATIENT PHYSICAL THERAPY THORACOLUMBAR EVALUATION   Patient Name: Sabrina Castaneda MRN: [id number] DOB:February 21, 1935, 88 y.o., female Today's Date: 09/26/2023  END OF SESSION:  PT End of Session - 09/26/23 1130     Visit Number 1    Date for PT Re-Evaluation 11/21/23    Authorization Type [employer]    Progress Note Due on Visit 10    PT Start Time 0847    PT Stop Time 0929    PT Time Calculation (min) 42 min    Activity Tolerance Patient tolerated treatment well    Behavior During Therapy [facility] for tasks assessed/performed          Past Medical History:  Diagnosis Date   Allergy    GERD (gastroesophageal reflux disease)    History of chicken pox    Hyperlipidemia    Scoliosis    Thyroid  disease    Past Surgical History:  Procedure Laterality Date   ABDOMINAL HYSTERECTOMY  1980   ADENOIDECTOMY     HERNIA REPAIR  02/04/1966   REPLACEMENT TOTAL KNEE Right    TONSILLECTOMY     WRIST FRACTURE SURGERY Bilateral    Patient Active Problem List   Diagnosis Date Noted   Acquired trigger finger of left middle finger 03/11/2023   Neck pain 12/09/2022   Cervical radiculopathy 11/18/2022   Scoliosis of thoracic spine 11/18/2022   Depression 03/14/2020   Overweight (BMI 25.0-29.9) 03/14/2020   Physical exam 09/10/2019   Hyperlipidemia 08/16/2019   Hypothyroid 08/16/2019    PCP:  Mahlon Comer BRAVO, MD  REFERRING PROVIDER: Laqueta Ozell BIRCH, MD  REFERRING DIAG: Joint Pain  Rationale for Evaluation and Treatment: Rehabilitation  THERAPY DIAG:  Other low back pain - Plan: PT plan of care cert/re-cert  Muscle weakness (generalized) - Plan: PT plan of care cert/re-cert  Unsteadiness on feet - Plan: PT plan of care cert/re-cert  ONSET DATE: May 2025  SUBJECTIVE:                                                                                                                                                                                           SUBJECTIVE  STATEMENT: Patient presents complaining of sciatic nerve and back pain that started three months ago. It has been getting progressively worse and she was prescribed prednisone  and that has helped some. She wants to avoid getting an injection and wanted to try physical therapy. Limiting functional activities[place], sitting > 1 hour. She has a single point cane but she does not use it unless she is feeling wobbly. She denies radicular symptoms.  PERTINENT HISTORY:  Scoliosis; Hx right knee replacement; hx cervical fusion  PAIN:  Are you having pain? Yes: NPRS scale: 2(currently) 10(worst)/10 Pain location: left sided lumbar pain but has moved to the right side now Pain description: stabbing pain that wouldn't go away Aggravating factors: walking across the street (she lives in [place]) Relieving factors: Nothing  PRECAUTIONS: None  RED FLAGS: None   WEIGHT BEARING RESTRICTIONS: No  FALLS:  Has patient fallen in last 6 months? No but patient does have a fear of falling  LIVING ENVIRONMENT: Lives with: lives alone in [place]  Lives in: House/apartment Stairs: No Has following equipment at home: Single point cane  OCCUPATION: Retired  PLOF: Independent, Independent with basic ADLs, Independent with household mobility without device, Independent with community mobility without device, Independent with gait, Independent with transfers, and Leisure: Playing cards, hanging out friends, events at [employer], reading  PATIENT GOALS: Find something to strengthen the muscles and relieve the pain  NEXT MD VISIT: PRN  OBJECTIVE:  Note: Objective measures were completed at Evaluation unless otherwise noted.  DIAGNOSTIC FINDINGS:  From 09/16/2023 MD Note: X-ray lumbar spine (08/25/2023): 5 lumbar-type vertebral bodies with severe levoconvex lumbar scoliosis. Grade 1 anterolisthesis of L3 on L4 and L4 on L5. Severe disc space narrowing  at L4-5 and notable at L3-4 and L5-S1. Facet arthrosis noted throughout the lower lumbar spine.    MRI Cervical spine (01/13/2023): C2-3 moderate left and moderate-severe right foraminal stenosis. C3-4 moderate biforaminal stenosis. C4-5 with moderate left foraminal stenosis and C5-6 with moderate right foraminal stenosis.   PATIENT SURVEYS:  Modified Oswestry: 9/50 18%  Minimally Clinically Important Difference (MCID) = 12.8%  COGNITION: Overall cognitive status: Within functional limits for tasks assessed     SENSATION: WFL  MUSCLE LENGTH: Hamstrings: WFL   POSTURE: rounded shoulders and forward head With scoliosis thoracic curve concave to the left  PALPATION: No tenderness in bilateral glutes or paraspinals  LUMBAR ROM: *pain  AROM eval  Flexion Can touch toes  Extension 90% limited *  Right lateral flexion Bends to knee joint  Left lateral flexion Bends to above knee joint *  Right rotation full  Left rotation 80% limited   (Blank rows = not tested)  LOWER EXTREMITY ROM: PROM:   Limited hip IR bilateral ; unable to achieve FABER position on Rt    LOWER EXTREMITY MMT:    MMT Right eval Left eval  Hip flexion 4- 4-  Hip extension    Hip abduction 4- 4-  Hip adduction 4 4  Hip internal rotation    Hip external rotation    Knee flexion 4 4  Knee extension 4 4  Ankle dorsiflexion    Ankle plantarflexion    Ankle inversion    Ankle eversion     (Blank rows = not tested)   FUNCTIONAL TESTS:  5 times sit to stand: 15.89 sec with UE support Timed up and go (TUG): 12.95 sec patient caught right foot behind left. Decreased speed of turn  GAIT: Comments: decreased cadence; Wide BOS  TREATMENT DATE:  09/26/2023 Initial Evaluation & HEP created  Education on the benefits of using her cane more often for balance and stability. Told patient  to bring it with her next session. Log roll technique   PATIENT EDUCATION:  Education details: Provided patient education and discussion on quality of life and stress management. Discussed motivators, challenges, and goals for future.  Person educated: Patient Education method: Explanation, Demonstration, and Handouts Education comprehension: verbalized understanding, returned demonstration, and needs further education  HOME EXERCISE PROGRAM: Access Code: [email or web address] URL: [email or web address] Date: 09/26/2023 Prepared by: Kristeen Sar  Exercises - Supine Lower Trunk Rotation  - 1 x daily - 7 x weekly - 1 sets - 8 reps - 4s hold - Sit to Stand with Armchair  - 1 x daily - 7 x weekly - 1-2 sets - 10 reps - Seated Hip Abduction with Resistance  - 1 x daily - 7 x weekly - 2 sets - 10 reps - Supine Alternating Knee Taps with Hands  - 1 x daily - 7 x weekly - 1 sets - 10 reps - 3s hold  ASSESSMENT:  CLINICAL IMPRESSION: Patient is a 88 y.o. female who was seen today for physical therapy evaluation and treatment for neck and thoracic back pain. Jala presents with increased back pain that started three months ago. She described it as sciatic nerve pain. She was prescribed prednisone  and that helped her pain some. She lives in a retirement facility and walking across the street for lunch and dinner is challenging for her. She verbalized having a fear of falling, but she does not like using her straight cane. Educated patient on the benefit of using her cane during ambulation for improved balance and steadiness. Patient will bring straight cane to next treatment session. During TUG patient tripped over her own foot some, but no LOB noted. Demonstrated log roll technique for bed mobility to decrease strain on lumbar spine. Patient also has severe scoliosis with a right sided S curve. Patient will benefit from skilled PT to address the below impairments and improve overall  function.   OBJECTIVE IMPAIRMENTS: Abnormal gait, decreased balance, decreased endurance, decreased mobility, difficulty walking, decreased ROM, decreased strength, hypomobility, increased muscle spasms, impaired flexibility, postural dysfunction, and pain.   ACTIVITY LIMITATIONS: carrying, lifting, bending, sitting, standing, squatting, stairs, transfers, bed mobility, and locomotion level  PARTICIPATION LIMITATIONS: meal prep, cleaning, laundry, interpersonal relationship, driving, and community activity  PERSONAL FACTORS: Age, Fitness, Time since onset of injury/illness/exacerbation, and 1 comorbidity: Scoliosis are also affecting patient's functional outcome.   REHAB POTENTIAL: Good  CLINICAL DECISION MAKING: Stable/uncomplicated  EVALUATION COMPLEXITY: Moderate   GOALS: Goals reviewed with patient? Yes  SHORT TERM GOALS: Target date: 10/24/2023 Patient will be independent with initial HEP. Baseline:  Goal status: INITIAL  2.  Patient will be able to participate in a to establish a baseline for functional test. Baseline:  Goal status: INITIAL   LONG TERM GOALS: Target date: 11/21/2023  Patient will demonstrate independence in advanced HEP. Baseline:  Goal status: INITIAL  2.  Patient will report > or = to 60% improvement in back pain since starting PT. Baseline:  Goal status: INITIAL  3.  Patient will verbalize and demonstrate self-care strategies to manage pain including tissue mobility practices and change of position. Baseline:  Goal status: INITIAL   4.  Patient will be able to walk across the street in her retirement facility with no back pain for improved functional mobility. Baseline:  Goal status: INITIAL  5.  Patient will be able to carry  at least 5# to help her ability to bring in groceries safely. Baseline:  Goal status: INITIAL  6.  Patient will be able to ambulate at least 815ft with LRAD to improve ability to ambulate community distances  safely. Baseline:  Goal status: INITIAL  PLAN:  PT FREQUENCY: 2x/week  PT DURATION: 8 weeks  PLANNED INTERVENTIONS: 97164- PT Re-evaluation, 97110-Therapeutic exercises, 97530- Therapeutic activity, [id number]- Neuromuscular re-education, 97535- Self Care, 02859- Manual therapy, [id number]- Gait training, [id number]- Canalith repositioning, [id number]- Aquatic Therapy, [phone]- Electrical stimulation (unattended), [phone]- Electrical stimulation (manual), [id number]- Vasopneumatic device, [id number]- Traction (mechanical), [id number]- Ionotophoresis 4mg /ml Dexamethasone, 79439 (1-2 muscles), 20561 (3+ muscles)- Dry Needling, Patient/Family education, Balance training, Stair training, Taping, Joint mobilization, Joint manipulation, Vestibular training, Cryotherapy, and Moist heat.  PLAN FOR NEXT SESSION: Review HEP; NuStep; DGI ;functional strengthening; TA act   Kristeen Sar, PT 09/26/23 11:33 AM [facility] [address] [place], [place] [address] Phone # [phone] Fax [phone]

## 2023-09-26 ENCOUNTER — Ambulatory Visit: Attending: Anesthesiology | Admitting: Physical Therapy

## 2023-09-26 ENCOUNTER — Other Ambulatory Visit: Payer: Self-pay

## 2023-09-26 ENCOUNTER — Encounter: Payer: Self-pay | Admitting: Physical Therapy

## 2023-09-26 DIAGNOSIS — R2681 Unsteadiness on feet: Secondary | ICD-10-CM | POA: Diagnosis present

## 2023-09-26 DIAGNOSIS — M5459 Other low back pain: Secondary | ICD-10-CM | POA: Insufficient documentation

## 2023-09-26 DIAGNOSIS — M6281 Muscle weakness (generalized): Secondary | ICD-10-CM | POA: Diagnosis present

## 2023-10-02 ENCOUNTER — Telehealth: Payer: Self-pay | Admitting: Rehabilitative and Restorative Service Providers"

## 2023-10-02 ENCOUNTER — Ambulatory Visit: Admitting: Rehabilitative and Restorative Service Providers"

## 2023-10-02 NOTE — Telephone Encounter (Signed)
 Called patient secondary to missed visit.  She apologized stating that she thought her appointment was for tomorrow and she must have written it down wrong.  Recommended patient call Friday morning to see if anyone has canceled and she can schedule another appointment.  She verbalizes understanding.

## 2023-10-09 ENCOUNTER — Encounter: Payer: Self-pay | Admitting: Rehabilitative and Restorative Service Providers"

## 2023-10-09 ENCOUNTER — Ambulatory Visit: Attending: Anesthesiology | Admitting: Rehabilitative and Restorative Service Providers"

## 2023-10-09 DIAGNOSIS — M6281 Muscle weakness (generalized): Secondary | ICD-10-CM | POA: Diagnosis present

## 2023-10-09 DIAGNOSIS — M5459 Other low back pain: Secondary | ICD-10-CM | POA: Diagnosis present

## 2023-10-09 DIAGNOSIS — R2681 Unsteadiness on feet: Secondary | ICD-10-CM | POA: Diagnosis present

## 2023-10-09 NOTE — Therapy (Signed)
 OUTPATIENT PHYSICAL THERAPY TREATMENT NOTE   Patient Name: Sabrina Castaneda MRN: [id number] DOB:[birth year], 88 y.o., female Today's Date: 10/09/2023  END OF SESSION:  PT End of Session - 10/09/23 0930     Visit Number 2    Date for PT Re-Evaluation 11/21/23    Authorization Type [employer]    Progress Note Due on Visit 10    PT Start Time 0929    PT Stop Time 1010    PT Time Calculation (min) 41 min    Activity Tolerance Patient tolerated treatment well    Behavior During Therapy [facility] for tasks assessed/performed          Past Medical History:  Diagnosis Date   Allergy    GERD (gastroesophageal reflux disease)    History of chicken pox    Hyperlipidemia    Scoliosis    Thyroid  disease    Past Surgical History:  Procedure Laterality Date   ABDOMINAL HYSTERECTOMY  1980   ADENOIDECTOMY     HERNIA REPAIR  02/04/1966   REPLACEMENT TOTAL KNEE Right    TONSILLECTOMY     WRIST FRACTURE SURGERY Bilateral    Patient Active Problem List   Diagnosis Date Noted   Acquired trigger finger of left middle finger 03/11/2023   Neck pain 12/09/2022   Cervical radiculopathy 11/18/2022   Scoliosis of thoracic spine 11/18/2022   Depression 03/14/2020   Overweight (BMI 25.0-29.9) 03/14/2020   Physical exam 09/10/2019   Hyperlipidemia 08/16/2019   Hypothyroid 08/16/2019    PCP:  Mahlon Comer BRAVO, MD  REFERRING PROVIDER: Laqueta Ozell BIRCH, MD  REFERRING DIAG: Joint Pain  Rationale for Evaluation and Treatment: Rehabilitation  THERAPY DIAG:  Other low back pain  Muscle weakness (generalized)  Unsteadiness on feet  ONSET DATE: May 2025  SUBJECTIVE:                                                                                                                                                                                           SUBJECTIVE STATEMENT: Patient states that she has been feeling okay today.  States that she has been doing her exercises, as  able.  PERTINENT HISTORY:  Scoliosis; Hx right knee replacement; hx cervical fusion, pt reports diplopia   PAIN:  Are you having pain? Yes: NPRS scale: 2/10 Pain location: left sided lumbar pain but has moved to the right side now Pain description: stabbing pain that wouldn't go away Aggravating factors: walking across the street (she lives in [facility]) Relieving factors: Nothing  PRECAUTIONS: None  RED FLAGS: None   WEIGHT BEARING RESTRICTIONS: No  FALLS:  Has patient fallen in  last 6 months? No but patient does have a fear of falling  LIVING ENVIRONMENT: Lives with: lives alone in [facility]  Lives in: House/apartment Stairs: No Has following equipment at home: Single point cane  OCCUPATION: Retired  PLOF: Independent, Independent with basic ADLs, Independent with household mobility without device, Independent with community mobility without device, Independent with gait, Independent with transfers, and Leisure: Playing cards, hanging out friends, events at [employer], reading  PATIENT GOALS: Find something to strengthen the muscles and relieve the pain  NEXT MD VISIT: PRN  OBJECTIVE:  Note: Objective measures were completed at Evaluation unless otherwise noted.  DIAGNOSTIC FINDINGS:  From 09/16/2023 MD Note: X-ray lumbar spine (08/25/2023): 5 lumbar-type vertebral bodies with severe levoconvex lumbar scoliosis. Grade 1 anterolisthesis of L3 on L4 and L4 on L5. Severe disc space narrowing at L4-5 and notable at L3-4 and L5-S1. Facet arthrosis noted throughout the lower lumbar spine.    MRI Cervical spine (01/13/2023): C2-3 moderate left and moderate-severe right foraminal stenosis. C3-4 moderate biforaminal stenosis. C4-5 with moderate left foraminal stenosis and C5-6 with moderate right foraminal stenosis.   PATIENT SURVEYS:  Modified Oswestry: 9/50 18%  Minimally Clinically Important Difference (MCID) = 12.8%  COGNITION: Overall cognitive status: Within  functional limits for tasks assessed     SENSATION: WFL  MUSCLE LENGTH: Hamstrings: WFL   POSTURE: rounded shoulders and forward head With scoliosis thoracic curve concave to the left  PALPATION: No tenderness in bilateral glutes or paraspinals  LUMBAR ROM: *pain  AROM eval  Flexion Can touch toes  Extension 90% limited *  Right lateral flexion Bends to knee joint  Left lateral flexion Bends to above knee joint *  Right rotation full  Left rotation 80% limited   (Blank rows = not tested)  LOWER EXTREMITY ROM: PROM:   Limited hip IR bilateral ; unable to achieve FABER position on Rt    LOWER EXTREMITY MMT:    MMT Right eval Left eval  Hip flexion 4- 4-  Hip extension    Hip abduction 4- 4-  Hip adduction 4 4  Hip internal rotation    Hip external rotation    Knee flexion 4 4  Knee extension 4 4  Ankle dorsiflexion    Ankle plantarflexion    Ankle inversion    Ankle eversion     (Blank rows = not tested)   FUNCTIONAL TESTS:  Eval: 5 times sit to stand: 15.89 sec with UE support Timed up and go (TUG): 12.95 sec patient caught right foot behind left. Decreased speed of turn  10/09/2023: 6 minute walk test:  1000 ft with RPE of 5-6/10 and pain increasing to 5/10 (age related norms are 1,155 ft)  GAIT: Comments: decreased cadence; Wide BOS  TREATMENT DATE:  10/09/2023 Nustep level 3 x3 min with PT present to discuss status 6 minute walk test:  1,000 ft with RPE of 5-6/10 and pain increasing to 5/10 Seated hip abduction with red tband 2x10 Seated marching with red tband around thighs 2x10 Seated hamstring curl with red tband 2x10 bilat Seated lateral sidebend with overhead reach over peanut ball x10 bilat Seated row with red theraband 2x10 Seated smooth pursuit for vertical and horizontal x10 each Seated smooth pursuit for pencil push-up x10   09/26/2023  Initial Evaluation & HEP created  Education on the benefits of using her cane more often for balance and stability. Told patient to bring it with her next session. Log roll technique   PATIENT EDUCATION:  Education details: Provided patient education and discussion on quality of life and stress management. Discussed motivators, challenges, and goals for future.  Person educated: Patient Education method: Explanation, Demonstration, and Handouts Education comprehension: verbalized understanding, returned demonstration, and needs further education  HOME EXERCISE PROGRAM: Access Code: [email or web address] URL: [email or web address] Date: 10/09/2023 Prepared by: Jarrell Franko Hilliker  Exercises - Supine Lower Trunk Rotation  - 1 x daily - 7 x weekly - 1 sets - 8 reps - 4s hold - Supine Alternating Knee Taps with Hands  - 1 x daily - 7 x weekly - 1 sets - 10 reps - 3s hold - Sit to Stand with Armchair  - 1 x daily - 7 x weekly - 1-2 sets - 10 reps - Seated Hip Abduction with Resistance  - 1 x daily - 7 x weekly - 2 sets - 10 reps - Seated March with Resistance  - 1 x daily - 7 x weekly - 2 sets - 10 reps - Standing Shoulder Row with Anchored Resistance  - 1 x daily - 7 x weekly - 2 sets - 10 reps - Seated Horizontal Smooth Pursuit  - 1 x daily - 7 x weekly - 2 sets - 10 reps - Seated Vertical Smooth Pursuit  - 1 x daily - 7 x weekly - 2 sets - 10 reps - Seated Proximal-Distal Smooth Pursuit  - 1 x daily - 7 x weekly - 2 sets - 10 reps  ASSESSMENT:  CLINICAL IMPRESSION: Ms Dedic presents to skilled PT reporting that she has been using her cane with walking around her complex.  Patient states that she has been noticing increased dyspnea and pain with ambulation, especially when compared to last year when she was able to walk around during her trip to [place].  Patient reports that she has instability with walking due to having one leg longer than the other and double vision.   Patient reports that the double vision has been worse recently.  Educated patient on smooth pursuit exercises to help retrain her eye muscles.  Patient was provided with updated HEP and a target for use with smooth pursuit exercises.  Patient states that she already has theraband at home.  Patient with some difficulty with hamstring curls secondary to weakness, especially on the right LE.  Patient continues to require skilled PT to progress towards goal related activities.   OBJECTIVE IMPAIRMENTS: Abnormal gait, decreased balance, decreased endurance, decreased mobility, difficulty walking, decreased ROM, decreased strength, hypomobility, increased muscle spasms, impaired flexibility, postural dysfunction, and pain.   ACTIVITY LIMITATIONS: carrying, lifting, bending, sitting, standing, squatting, stairs, transfers, bed mobility, and locomotion level  PARTICIPATION LIMITATIONS: meal prep, cleaning, laundry, interpersonal relationship, driving, and community activity  PERSONAL FACTORS: Age, Fitness, Time since onset of injury/illness/exacerbation, and 1 comorbidity: Scoliosis are also affecting patient's functional outcome.   REHAB POTENTIAL: Good  CLINICAL DECISION MAKING: Stable/uncomplicated  EVALUATION COMPLEXITY: Moderate   GOALS: Goals reviewed with patient? Yes  SHORT TERM GOALS: Target date: 10/24/2023 Patient will be independent with initial HEP. Baseline:  Goal status: Met on 10/09/23  2.  Patient will be able to participate in a to establish a baseline for functional test. Baseline:  Goal status: Met on 10/09/23 (1,000 ft with RPE of 5-6/10 and increased pain)   LONG TERM GOALS: Target  date: 11/21/2023  Patient will demonstrate independence in advanced HEP. Baseline:  Goal status: INITIAL  2.  Patient will report > or = to 60% improvement in back pain since starting PT. Baseline:  Goal status: INITIAL  3.  Patient will verbalize and demonstrate self-care strategies  to manage pain including tissue mobility practices and change of position. Baseline:  Goal status: INITIAL   4.  Patient will be able to walk across the street in her retirement facility with no back pain for improved functional mobility. Baseline:  Goal status: INITIAL  5.  Patient will be able to carry at least 5# to help her ability to bring in groceries safely. Baseline:  Goal status: INITIAL  6.  Patient will be able to ambulate at least 853ft with LRAD to improve ability to ambulate community distances safely. Baseline:  Goal status: INITIAL  PLAN:  PT FREQUENCY: 2x/week  PT DURATION: 8 weeks  PLANNED INTERVENTIONS: 97164- PT Re-evaluation, 97110-Therapeutic exercises, 97530- Therapeutic activity, 97112- Neuromuscular re-education, 97535- Self Care, 02859- Manual therapy, [id number]- Gait training, [id number]- Canalith repositioning, [id number]- Aquatic Therapy, [phone]- Electrical stimulation (unattended), [phone]- Electrical stimulation (manual), [id number]- Vasopneumatic device, [id number]- Traction (mechanical), [id number]- Ionotophoresis 4mg /ml Dexamethasone, 79439 (1-2 muscles), 20561 (3+ muscles)- Dry Needling, Patient/Family education, Balance training, Stair training, Taping, Joint mobilization, Joint manipulation, Vestibular training, Cryotherapy, and Moist heat.  PLAN FOR NEXT SESSION: Review HEP; NuStep; functional strengthening, balance   Jarrell Laming, PT, DPT 10/09/23, 10:26 AM  [facility] [address] [place], [place] [address] Phone # [phone] Fax [phone]

## 2023-10-15 ENCOUNTER — Ambulatory Visit: Admitting: Physical Therapy

## 2023-10-15 ENCOUNTER — Encounter: Payer: Self-pay | Admitting: Physical Therapy

## 2023-10-15 DIAGNOSIS — M5459 Other low back pain: Secondary | ICD-10-CM | POA: Diagnosis not present

## 2023-10-15 DIAGNOSIS — M6281 Muscle weakness (generalized): Secondary | ICD-10-CM

## 2023-10-15 DIAGNOSIS — R2681 Unsteadiness on feet: Secondary | ICD-10-CM

## 2023-10-15 NOTE — Therapy (Signed)
 OUTPATIENT PHYSICAL THERAPY TREATMENT NOTE   Patient Name: Sabrina Castaneda MRN: [id number] DOB:[birth year], 88 y.o., female Today's Date: 10/15/2023  END OF SESSION:  PT End of Session - 10/15/23 0930     Visit Number 3    Date for PT Re-Evaluation 11/21/23    Authorization Type [employer] Medicare    Progress Note Due on Visit 10    PT Start Time [id number]    PT Stop Time 0927    PT Time Calculation (min) 44 min    Activity Tolerance Patient tolerated treatment well    Behavior During Therapy [facility] for tasks assessed/performed           Past Medical History:  Diagnosis Date   Allergy    GERD (gastroesophageal reflux disease)    History of chicken pox    Hyperlipidemia    Scoliosis    Thyroid  disease    Past Surgical History:  Procedure Laterality Date   ABDOMINAL HYSTERECTOMY  1980   ADENOIDECTOMY     HERNIA REPAIR  02/04/1966   REPLACEMENT TOTAL KNEE Right    TONSILLECTOMY     WRIST FRACTURE SURGERY Bilateral    Patient Active Problem List   Diagnosis Date Noted   Acquired trigger finger of left middle finger 03/11/2023   Neck pain 12/09/2022   Cervical radiculopathy 11/18/2022   Scoliosis of thoracic spine 11/18/2022   Depression 03/14/2020   Overweight (BMI 25.0-29.9) 03/14/2020   Physical exam 09/10/2019   Hyperlipidemia 08/16/2019   Hypothyroid 08/16/2019    PCP:  Mahlon Comer BRAVO, MD  REFERRING PROVIDER: Laqueta Ozell BIRCH, MD  REFERRING DIAG: Joint Pain  Rationale for Evaluation and Treatment: Rehabilitation  THERAPY DIAG:  Other low back pain  Muscle weakness (generalized)  Unsteadiness on feet  ONSET DATE: May 2025  SUBJECTIVE:                                                                                                                                                                                           SUBJECTIVE STATEMENT: Patient reports she is doing okay today. She is not currently having any pain. She has been compliant with  HEP  PERTINENT HISTORY:  Scoliosis; Hx right knee replacement; hx cervical fusion, pt reports diplopia   PAIN:  Are you having pain? Yes: NPRS scale: 2/10 Pain location: left sided lumbar pain but has moved to the right side now Pain description: stabbing pain that wouldn't go away Aggravating factors: walking across the street (she lives in [facility]) Relieving factors: Nothing  PRECAUTIONS: None  RED FLAGS: None   WEIGHT BEARING RESTRICTIONS: No  FALLS:  Has patient fallen  in last 6 months? No but patient does have a fear of falling  LIVING ENVIRONMENT: Lives with: lives alone in [facility]  Lives in: House/apartment Stairs: No Has following equipment at home: Single point cane  OCCUPATION: Retired  PLOF: Independent, Independent with basic ADLs, Independent with household mobility without device, Independent with community mobility without device, Independent with gait, Independent with transfers, and Leisure: Playing cards, hanging out friends, events at [employer], reading  PATIENT GOALS: Find something to strengthen the muscles and relieve the pain  NEXT MD VISIT: PRN  OBJECTIVE:  Note: Objective measures were completed at Evaluation unless otherwise noted.  DIAGNOSTIC FINDINGS:  From 09/16/2023 MD Note: X-ray lumbar spine (08/25/2023): 5 lumbar-type vertebral bodies with severe levoconvex lumbar scoliosis. Grade 1 anterolisthesis of L3 on L4 and L4 on L5. Severe disc space narrowing at L4-5 and notable at L3-4 and L5-S1. Facet arthrosis noted throughout the lower lumbar spine.    MRI Cervical spine (01/13/2023): C2-3 moderate left and moderate-severe right foraminal stenosis. C3-4 moderate biforaminal stenosis. C4-5 with moderate left foraminal stenosis and C5-6 with moderate right foraminal stenosis.   PATIENT SURVEYS:  Modified Oswestry: 9/50 18%  Minimally Clinically Important Difference (MCID) = 12.8%  COGNITION: Overall cognitive status: Within  functional limits for tasks assessed     SENSATION: WFL  MUSCLE LENGTH: Hamstrings: WFL   POSTURE: rounded shoulders and forward head With scoliosis thoracic curve concave to the left  PALPATION: No tenderness in bilateral glutes or paraspinals  LUMBAR ROM: *pain  AROM eval  Flexion Can touch toes  Extension 90% limited *  Right lateral flexion Bends to knee joint  Left lateral flexion Bends to above knee joint *  Right rotation full  Left rotation 80% limited   (Blank rows = not tested)  LOWER EXTREMITY ROM: PROM:   Limited hip IR bilateral ; unable to achieve FABER position on Rt    LOWER EXTREMITY MMT:    MMT Right eval Left eval  Hip flexion 4- 4-  Hip extension    Hip abduction 4- 4-  Hip adduction 4 4  Hip internal rotation    Hip external rotation    Knee flexion 4 4  Knee extension 4 4  Ankle dorsiflexion    Ankle plantarflexion    Ankle inversion    Ankle eversion     (Blank rows = not tested)   FUNCTIONAL TESTS:  Eval: 5 times sit to stand: 15.89 sec with UE support Timed up and go (TUG): 12.95 sec patient caught right foot behind left. Decreased speed of turn  10/09/2023: 6 minute walk test:  1000 ft with RPE of 5-6/10 and pain increasing to 5/10 (age related norms are 1,155 ft)  GAIT: Comments: decreased cadence; Wide BOS  TREATMENT DATE:  10/15/2023 Nustep level 5 x6 min with PT present to discuss status Seated TA activation with small purple ball 2 x 10 Seated TA activation + ball squeeze x 20 Sit to stand x 10 then x 10 holding 5# KB Seated chest press with 5# KB x 10 Standing row with red theraband 2x10 Seated hamstring curl with red tband 2x10 bilat Single leg on theraband forward & lateral opposite leg swing x 8 bilateral  Weight shifts in airex (medial/lateral) x 1 min Seated lateral sidebend with overhead reach over peanut ball x10 bilat 6inch step taps unilateral UE support x 20 Hurdles (forwards & sideways) x 4 laps  each Farmer's carries 4# DB to cancer gym and back Gait training  to prevent left hip adduction when walking. Patient verbalized this trips her up sometimes.    10/09/2023 Nustep level 3 x3 min with PT present to discuss status 6 minute walk test:  1,000 ft with RPE of 5-6/10 and pain increasing to 5/10 Seated hip abduction with red tband 2x10 Seated marching with red tband around thighs 2x10 Seated hamstring curl with red tband 2x10 bilat Seated lateral sidebend with overhead reach over peanut ball x10 bilat Seated row with red theraband 2x10 Seated smooth pursuit for vertical and horizontal x10 each Seated smooth pursuit for pencil push-up x10   09/26/2023  Initial Evaluation & HEP created                                                                                                                              Education on the benefits of using her cane more often for balance and stability. Told patient to bring it with her next session. Log roll technique   PATIENT EDUCATION:  Education details: Provided patient education and discussion on quality of life and stress management. Discussed motivators, challenges, and goals for future.  Person educated: Patient Education method: Explanation, Demonstration, and Handouts Education comprehension: verbalized understanding, returned demonstration, and needs further education  HOME EXERCISE PROGRAM: Access Code: [email or web address] URL: [email or web address] Date: 10/09/2023 Prepared by: Jarrell Menke  Exercises - Supine Lower Trunk Rotation  - 1 x daily - 7 x weekly - 1 sets - 8 reps - 4s hold - Supine Alternating Knee Taps with Hands  - 1 x daily - 7 x weekly - 1 sets - 10 reps - 3s hold - Sit to Stand with Armchair  - 1 x daily - 7 x weekly - 1-2 sets - 10 reps - Seated Hip Abduction with Resistance  - 1 x daily - 7 x weekly - 2 sets - 10 reps - Seated March with Resistance  - 1 x daily - 7 x weekly - 2 sets - 10 reps -  Standing Shoulder Row with Anchored Resistance  - 1 x daily - 7 x weekly - 2 sets - 10 reps - Seated Horizontal Smooth Pursuit  - 1 x daily - 7 x weekly - 2 sets - 10 reps - Seated Vertical Smooth Pursuit  - 1 x daily - 7 x weekly - 2 sets - 10 reps - Seated Proximal-Distal Smooth Pursuit  - 1 x daily - 7 x weekly - 2 sets - 10 reps  ASSESSMENT:  CLINICAL IMPRESSION: Ms Mander presents to skilled PT reporting no current back pain, but she still has increased back pain at night. She has been compliant with HEP and provided patient with red TB to complete postural exercises as home. Incorporated TA activation exercises and patient required verbal and visual cues for correct performance. With single leg balance exercises patient verbalized being fearful of falling. PT provided close guarding for patient as needed. Patient should continue to process well with  skilled therapy.   OBJECTIVE IMPAIRMENTS: Abnormal gait, decreased balance, decreased endurance, decreased mobility, difficulty walking, decreased ROM, decreased strength, hypomobility, increased muscle spasms, impaired flexibility, postural dysfunction, and pain.   ACTIVITY LIMITATIONS: carrying, lifting, bending, sitting, standing, squatting, stairs, transfers, bed mobility, and locomotion level  PARTICIPATION LIMITATIONS: meal prep, cleaning, laundry, interpersonal relationship, driving, and community activity  PERSONAL FACTORS: Age, Fitness, Time since onset of injury/illness/exacerbation, and 1 comorbidity: Scoliosis are also affecting patient's functional outcome.   REHAB POTENTIAL: Good  CLINICAL DECISION MAKING: Stable/uncomplicated  EVALUATION COMPLEXITY: Moderate   GOALS: Goals reviewed with patient? Yes  SHORT TERM GOALS: Target date: 10/24/2023 Patient will be independent with initial HEP. Baseline:  Goal status: Met on 10/09/23  2.  Patient will be able to participate in a to establish a baseline for functional  test. Baseline:  Goal status: Met on 10/09/23 (1,000 ft with RPE of 5-6/10 and increased pain)   LONG TERM GOALS: Target date: 11/21/2023  Patient will demonstrate independence in advanced HEP. Baseline:  Goal status: INITIAL  2.  Patient will report > or = to 60% improvement in back pain since starting PT. Baseline:  Goal status: INITIAL  3.  Patient will verbalize and demonstrate self-care strategies to manage pain including tissue mobility practices and change of position. Baseline:  Goal status: INITIAL   4.  Patient will be able to walk across the street in her retirement facility with no back pain for improved functional mobility. Baseline:  Goal status: INITIAL  5.  Patient will be able to carry at least 5# to help her ability to bring in groceries safely. Baseline:  Goal status: INITIAL  6.  Patient will be able to ambulate at least 859ft with LRAD to improve ability to ambulate community distances safely. Baseline:  Goal status: INITIAL  PLAN:  PT FREQUENCY: 2x/week  PT DURATION: 8 weeks  PLANNED INTERVENTIONS: 97164- PT Re-evaluation, 97110-Therapeutic exercises, 97530- Therapeutic activity, 97112- Neuromuscular re-education, 97535- Self Care, 02859- Manual therapy, [id number]- Gait training, [phone]- Canalith repositioning, [id number]- Aquatic Therapy, [phone]- Electrical stimulation (unattended), [phone]- Electrical stimulation (manual), [id number]- Vasopneumatic device, [id number]- Traction (mechanical), [id number]- Ionotophoresis 4mg /ml Dexamethasone, 79439 (1-2 muscles), [id number] (3+ muscles)- Dry Needling, Patient/Family education, Balance training, Stair training, Taping, Joint mobilization, Joint manipulation, Vestibular training, Cryotherapy, and Moist heat.  PLAN FOR NEXT SESSION: single leg balance; core strengthening; gait training to prevent left hip adduction    Kristeen Sar, PT 10/15/23 9:31 AM [facility] [address], [address] [place], [place]  [address] Phone # [phone] Fax [phone]

## 2023-10-17 ENCOUNTER — Ambulatory Visit: Admitting: Rehabilitative and Restorative Service Providers"

## 2023-10-17 ENCOUNTER — Encounter: Payer: Self-pay | Admitting: Rehabilitative and Restorative Service Providers"

## 2023-10-17 DIAGNOSIS — M5459 Other low back pain: Secondary | ICD-10-CM | POA: Diagnosis not present

## 2023-10-17 DIAGNOSIS — R2681 Unsteadiness on feet: Secondary | ICD-10-CM

## 2023-10-17 DIAGNOSIS — M6281 Muscle weakness (generalized): Secondary | ICD-10-CM

## 2023-10-17 NOTE — Therapy (Signed)
 OUTPATIENT PHYSICAL THERAPY TREATMENT NOTE   Patient Name: Sabrina Castaneda MRN: [id number] DOB:1935/12/31, 88 y.o., female Today's Date: 10/17/2023  END OF SESSION:  PT End of Session - 10/17/23 1205     Visit Number 4    Date for PT Re-Evaluation 11/21/23    Authorization Type [employer]    Progress Note Due on Visit 10    PT Start Time 1015    PT Stop Time 1055    PT Time Calculation (min) 40 min    Activity Tolerance Patient tolerated treatment well    Behavior During Therapy WFL for tasks assessed/performed            Past Medical History:  Diagnosis Date   Allergy    GERD (gastroesophageal reflux disease)    History of chicken pox    Hyperlipidemia    Scoliosis    Thyroid  disease    Past Surgical History:  Procedure Laterality Date   ABDOMINAL HYSTERECTOMY  1980   ADENOIDECTOMY     HERNIA REPAIR  02/04/1966   REPLACEMENT TOTAL KNEE Right    TONSILLECTOMY     WRIST FRACTURE SURGERY Bilateral    Patient Active Problem List   Diagnosis Date Noted   Acquired trigger finger of left middle finger 03/11/2023   Neck pain 12/09/2022   Cervical radiculopathy 11/18/2022   Scoliosis of thoracic spine 11/18/2022   Depression 03/14/2020   Overweight (BMI 25.0-29.9) 03/14/2020   Physical exam 09/10/2019   Hyperlipidemia 08/16/2019   Hypothyroid 08/16/2019    PCP:  Mahlon Comer BRAVO, MD  REFERRING PROVIDER: Laqueta Ozell BIRCH, MD  REFERRING DIAG: Joint Pain  Rationale for Evaluation and Treatment: Rehabilitation  THERAPY DIAG:  Other low back pain  Muscle weakness (generalized)  Unsteadiness on feet  ONSET DATE: May 2025  SUBJECTIVE:                                                                                                                                                                                           SUBJECTIVE STATEMENT: Pt reports she's feeling good, apart from her usual 4/10 pain in the lower back.   PERTINENT HISTORY:   Scoliosis; Hx right knee replacement; hx cervical fusion, pt reports diplopia   PAIN:  Are you having pain? Yes: NPRS scale: 4/10 Pain location: left sided lumbar pain but has moved to the right side now Pain description: stabbing pain that wouldn't go away Aggravating factors: walking across the street (she lives in [place]) Relieving factors: Nothing  PRECAUTIONS: None  RED FLAGS: None   WEIGHT BEARING RESTRICTIONS: No  FALLS:  Has patient fallen in last 6  months? No but patient does have a fear of falling  LIVING ENVIRONMENT: Lives with: lives alone in [place]  Lives in: House/apartment Stairs: No Has following equipment at home: Single point cane  OCCUPATION: Retired  PLOF: Independent, Independent with basic ADLs, Independent with household mobility without device, Independent with community mobility without device, Independent with gait, Independent with transfers, and Leisure: Playing cards, hanging out friends, events at [employer], reading  PATIENT GOALS: Find something to strengthen the muscles and relieve the pain  NEXT MD VISIT: PRN  OBJECTIVE:  Note: Objective measures were completed at Evaluation unless otherwise noted.  DIAGNOSTIC FINDINGS:  From 09/16/2023 MD Note: X-ray lumbar spine (08/25/2023): 5 lumbar-type vertebral bodies with severe levoconvex lumbar scoliosis. Grade 1 anterolisthesis of L3 on L4 and L4 on L5. Severe disc space narrowing at L4-5 and notable at L3-4 and L5-S1. Facet arthrosis noted throughout the lower lumbar spine.    MRI Cervical spine (01/13/2023): C2-3 moderate left and moderate-severe right foraminal stenosis. C3-4 moderate biforaminal stenosis. C4-5 with moderate left foraminal stenosis and C5-6 with moderate right foraminal stenosis.   PATIENT SURVEYS:  Modified Oswestry: 9/50 18%  Minimally Clinically Important Difference (MCID) = 12.8%  COGNITION: Overall cognitive status: Within functional limits for tasks  assessed     SENSATION: WFL  MUSCLE LENGTH: Hamstrings: WFL   POSTURE: rounded shoulders and forward head With scoliosis thoracic curve concave to the left  PALPATION: No tenderness in bilateral glutes or paraspinals  LUMBAR ROM: *pain  AROM eval  Flexion Can touch toes  Extension 90% limited *  Right lateral flexion Bends to knee joint  Left lateral flexion Bends to above knee joint *  Right rotation full  Left rotation 80% limited   (Blank rows = not tested)  LOWER EXTREMITY ROM: PROM:   Limited hip IR bilateral ; unable to achieve FABER position on Rt    LOWER EXTREMITY MMT:    MMT Right eval Left eval  Hip flexion 4- 4-  Hip extension    Hip abduction 4- 4-  Hip adduction 4 4  Hip internal rotation    Hip external rotation    Knee flexion 4 4  Knee extension 4 4  Ankle dorsiflexion    Ankle plantarflexion    Ankle inversion    Ankle eversion     (Blank rows = not tested)   FUNCTIONAL TESTS:  Eval: 5 times sit to stand: 15.89 sec with UE support Timed up and go (TUG): 12.95 sec patient caught right foot behind left. Decreased speed of turn  10/09/2023: 6 minute walk test:  1000 ft with RPE of 5-6/10 and pain increasing to 5/10 (age related norms are 1,155 ft)  GAIT: Comments: decreased cadence; Wide BOS  TREATMENT DATE:   10/17/23 Nustep level 5 x6 min with PT present to discuss status Seated TA activation with small purple ball 2 x 10 Seated TA activation + ball squeeze x 20 Seated LAQ AROM x10, with ankle weights 1# x10 Prone hamstring pull 2x10 with ankle weights each leg- hurt shoulder, moved to standing Cone tapping, 2 cones for single limb balance x10 Sit to stand x 10 -back started to hurt Seated OH press with 5# KB x10 Seated chest press with 5# KB x 10 Seated around the worlds using 2# x10  Seated AROM shoulder retraction 2x10 Hurdles (forwards & sideways) x 4 laps each    10/15/2023 Nustep level 5 x6 min with PT present to discuss  status Seated TA activation  with small purple ball 2 x 10 Seated TA activation + ball squeeze x 20 Sit to stand x 10 then x 10 holding 5# KB Seated chest press with 5# KB x 10 Standing row with red theraband 2x10 Seated hamstring curl with red tband 2x10 bilat Single leg on theraband forward & lateral opposite leg swing x 8 bilateral  Weight shifts in airex (medial/lateral) x 1 min Seated lateral sidebend with overhead reach over peanut ball x10 bilat 6inch step taps unilateral UE support x 20 Hurdles (forwards & sideways) x 4 laps each Farmer's carries 4# DB to cancer gym and back Gait training to prevent left hip adduction when walking. Patient verbalized this trips her up sometimes.    10/09/2023 Nustep level 3 x3 min with PT present to discuss status 6 minute walk test:  1,000 ft with RPE of 5-6/10 and pain increasing to 5/10 Seated hip abduction with red tband 2x10 Seated marching with red tband around thighs 2x10 Seated hamstring curl with red tband 2x10 bilat Seated lateral sidebend with overhead reach over peanut ball x10 bilat Seated row with red theraband 2x10 Seated smooth pursuit for vertical and horizontal x10 each Seated smooth pursuit for pencil push-up x10   09/26/2023  Initial Evaluation & HEP created                                                                                                                              Education on the benefits of using her cane more often for balance and stability. Told patient to bring it with her next session. Log roll technique   PATIENT EDUCATION:  Education details: Provided patient education and discussion on quality of life and stress management. Discussed motivators, challenges, and goals for future.  Person educated: Patient Education method: Explanation, Demonstration, and Handouts Education comprehension: verbalized understanding, returned demonstration, and needs further education  HOME EXERCISE  PROGRAM: Access Code: [email or web address] URL: [email or web address] Date: 10/09/2023 Prepared by: Jarrell Menke  Exercises - Supine Lower Trunk Rotation  - 1 x daily - 7 x weekly - 1 sets - 8 reps - 4s hold - Supine Alternating Knee Taps with Hands  - 1 x daily - 7 x weekly - 1 sets - 10 reps - 3s hold - Sit to Stand with Armchair  - 1 x daily - 7 x weekly - 1-2 sets - 10 reps - Seated Hip Abduction with Resistance  - 1 x daily - 7 x weekly - 2 sets - 10 reps - Seated March with Resistance  - 1 x daily - 7 x weekly - 2 sets - 10 reps - Standing Shoulder Row with Anchored Resistance  - 1 x daily - 7 x weekly - 2 sets - 10 reps - Seated Horizontal Smooth Pursuit  - 1 x daily - 7 x weekly - 2 sets - 10 reps - Seated Vertical Smooth Pursuit  - 1 x daily - 7 x weekly -  2 sets - 10 reps - Seated Proximal-Distal Smooth Pursuit  - 1 x daily - 7 x weekly - 2 sets - 10 reps  ASSESSMENT:  CLINICAL IMPRESSION:   Ms Langhans presents to skilled PT with the usual 4/10 low back pain. Today's session focused on LE/core strengthening for improving postural control. Pt did not well tolerate hamstring curls in prone position, due to it hurting her shoulder; pt redirected to performing hamstring curls in standing at counter. Pt did well during single limb balance, however had difficulty during sit to stands, stating her back was starting to hurt. Pt needs improvement with activating quad muscles before ascending off edge of table to standing. Pt will benefit from continuing skilled therapy to address the deficits below and move closer to her goal related activities.   OBJECTIVE IMPAIRMENTS: Abnormal gait, decreased balance, decreased endurance, decreased mobility, difficulty walking, decreased ROM, decreased strength, hypomobility, increased muscle spasms, impaired flexibility, postural dysfunction, and pain.   ACTIVITY LIMITATIONS: carrying, lifting, bending, sitting, standing, squatting, stairs, transfers,  bed mobility, and locomotion level  PARTICIPATION LIMITATIONS: meal prep, cleaning, laundry, interpersonal relationship, driving, and community activity  PERSONAL FACTORS: Age, Fitness, Time since onset of injury/illness/exacerbation, and 1 comorbidity: Scoliosis are also affecting patient's functional outcome.   REHAB POTENTIAL: Good  CLINICAL DECISION MAKING: Stable/uncomplicated  EVALUATION COMPLEXITY: Moderate   GOALS: Goals reviewed with patient? Yes  SHORT TERM GOALS: Target date: 10/24/2023 Patient will be independent with initial HEP. Baseline:  Goal status: Met on 10/09/23  2.  Patient will be able to participate in a to establish a baseline for functional test. Baseline:  Goal status: Met on 10/09/23 (1,000 ft with RPE of 5-6/10 and increased pain)   LONG TERM GOALS: Target date: 11/21/2023  Patient will demonstrate independence in advanced HEP. Baseline:  Goal status: INITIAL  2.  Patient will report > or = to 60% improvement in back pain since starting PT. Baseline:  Goal status: Progressing   3.  Patient will verbalize and demonstrate self-care strategies to manage pain including tissue mobility practices and change of position. Baseline:  Goal status: INITIAL   4.  Patient will be able to walk across the street in her retirement facility with no back pain for improved functional mobility. Baseline:  Goal status: Progressing   5.  Patient will be able to carry at least 5# to help her ability to bring in groceries safely. Baseline:  Goal status: Progressing   6.  Patient will be able to ambulate at least 860ft with LRAD to improve ability to ambulate community distances safely. Baseline:  Goal status: INITIAL  PLAN:  PT FREQUENCY: 2x/week  PT DURATION: 8 weeks  PLANNED INTERVENTIONS: 97164- PT Re-evaluation, 97110-Therapeutic exercises, 97530- Therapeutic activity, [id number]- Neuromuscular re-education, 97535- Self Care, [id number]- Manual therapy, [id number]-  Gait training, [phone]- Canalith repositioning, [id number]- Aquatic Therapy, [phone]- Electrical stimulation (unattended), [phone]- Electrical stimulation (manual), [id number]- Vasopneumatic device, [id number]- Traction (mechanical), [id number]- Ionotophoresis 4mg /ml Dexamethasone, 79439 (1-2 muscles), [id number] (3+ muscles)- Dry Needling, Patient/Family education, Balance training, Stair training, Taping, Joint mobilization, Joint manipulation, Vestibular training, Cryotherapy, and Moist heat.  PLAN FOR NEXT SESSION: single leg balance; core strengthening; gait training to prevent left hip adduction, quad activation     Lavanda Cleverly, SPT 10/17/23 12:26 PM  I agree with the following treatment note after reviewing documentation. This session was performed under the supervision of a licensed clinician. Jarrell Laming, PT, DPT 10/17/23, 12:27 PM  [facility] [address]  [address] [place], [place] [address] Phone # [phone] Fax [phone]

## 2023-10-21 ENCOUNTER — Encounter: Payer: Self-pay | Admitting: Physical Therapy

## 2023-10-21 ENCOUNTER — Ambulatory Visit: Admitting: Physical Therapy

## 2023-10-21 DIAGNOSIS — M5459 Other low back pain: Secondary | ICD-10-CM | POA: Diagnosis not present

## 2023-10-21 DIAGNOSIS — R2681 Unsteadiness on feet: Secondary | ICD-10-CM

## 2023-10-21 DIAGNOSIS — M6281 Muscle weakness (generalized): Secondary | ICD-10-CM

## 2023-10-21 NOTE — Therapy (Signed)
 OUTPATIENT PHYSICAL THERAPY TREATMENT NOTE   Patient Name: Sabrina Castaneda MRN: [id number] DOB:Jul 09, 1935, 88 y.o., female Today's Date: 10/21/2023  END OF SESSION:  PT End of Session - 10/21/23 1014     Visit Number 5    Date for PT Re-Evaluation 11/21/23    Authorization Type [employer]    Progress Note Due on Visit 10    PT Start Time 0933    PT Stop Time 1016    PT Time Calculation (min) 43 min    Activity Tolerance Patient tolerated treatment well    Behavior During Therapy [facility] for tasks assessed/performed             Past Medical History:  Diagnosis Date   Allergy    GERD (gastroesophageal reflux disease)    History of chicken pox    Hyperlipidemia    Scoliosis    Thyroid  disease    Past Surgical History:  Procedure Laterality Date   ABDOMINAL HYSTERECTOMY  1980   ADENOIDECTOMY     HERNIA REPAIR  02/04/1966   REPLACEMENT TOTAL KNEE Right    TONSILLECTOMY     WRIST FRACTURE SURGERY Bilateral    Patient Active Problem List   Diagnosis Date Noted   Acquired trigger finger of left middle finger 03/11/2023   Neck pain 12/09/2022   Cervical radiculopathy 11/18/2022   Scoliosis of thoracic spine 11/18/2022   Depression 03/14/2020   Overweight (BMI 25.0-29.9) 03/14/2020   Physical exam 09/10/2019   Hyperlipidemia 08/16/2019   Hypothyroid 08/16/2019    PCP:  Mahlon Comer BRAVO, MD  REFERRING PROVIDER: Laqueta Ozell BIRCH, MD  REFERRING DIAG: Joint Pain  Rationale for Evaluation and Treatment: Rehabilitation  THERAPY DIAG:  Other low back pain  Muscle weakness (generalized)  Unsteadiness on feet  ONSET DATE: May 2025  SUBJECTIVE:                                                                                                                                                                                           SUBJECTIVE STATEMENT: Patient reports she is doing good today. She still has occasional low back pain.  PERTINENT HISTORY:   Scoliosis; Hx right knee replacement; hx cervical fusion, pt reports diplopia   PAIN:  Are you having pain? Yes: NPRS scale: 4/10 Pain location: left sided lumbar pain but has moved to the right side now Pain description: stabbing pain that wouldn't go away Aggravating factors: walking across the street (she lives in [place]) Relieving factors: Nothing  PRECAUTIONS: None  RED FLAGS: None   WEIGHT BEARING RESTRICTIONS: No  FALLS:  Has patient fallen in last 6 months?  No but patient does have a fear of falling  LIVING ENVIRONMENT: Lives with: lives alone in [place]  Lives in: House/apartment Stairs: No Has following equipment at home: Single point cane  OCCUPATION: Retired  PLOF: Independent, Independent with basic ADLs, Independent with household mobility without device, Independent with community mobility without device, Independent with gait, Independent with transfers, and Leisure: Playing cards, hanging out friends, events at [employer], reading  PATIENT GOALS: Find something to strengthen the muscles and relieve the pain  NEXT MD VISIT: PRN  OBJECTIVE:  Note: Objective measures were completed at Evaluation unless otherwise noted.  DIAGNOSTIC FINDINGS:  From 09/16/2023 MD Note: X-ray lumbar spine (08/25/2023): 5 lumbar-type vertebral bodies with severe levoconvex lumbar scoliosis. Grade 1 anterolisthesis of L3 on L4 and L4 on L5. Severe disc space narrowing at L4-5 and notable at L3-4 and L5-S1. Facet arthrosis noted throughout the lower lumbar spine.    MRI Cervical spine (01/13/2023): C2-3 moderate left and moderate-severe right foraminal stenosis. C3-4 moderate biforaminal stenosis. C4-5 with moderate left foraminal stenosis and C5-6 with moderate right foraminal stenosis.   PATIENT SURVEYS:  Modified Oswestry: 9/50 18%  Minimally Clinically Important Difference (MCID) = 12.8%  COGNITION: Overall cognitive status: Within functional limits for tasks  assessed     SENSATION: WFL  MUSCLE LENGTH: Hamstrings: WFL   POSTURE: rounded shoulders and forward head With scoliosis thoracic curve concave to the left  PALPATION: No tenderness in bilateral glutes or paraspinals  LUMBAR ROM: *pain  AROM eval  Flexion Can touch toes  Extension 90% limited *  Right lateral flexion Bends to knee joint  Left lateral flexion Bends to above knee joint *  Right rotation full  Left rotation 80% limited   (Blank rows = not tested)  LOWER EXTREMITY ROM: PROM:   Limited hip IR bilateral ; unable to achieve FABER position on Rt    LOWER EXTREMITY MMT:    MMT Right eval Left eval  Hip flexion 4- 4-  Hip extension    Hip abduction 4- 4-  Hip adduction 4 4  Hip internal rotation    Hip external rotation    Knee flexion 4 4  Knee extension 4 4  Ankle dorsiflexion    Ankle plantarflexion    Ankle inversion    Ankle eversion     (Blank rows = not tested)   FUNCTIONAL TESTS:  Eval: 5 times sit to stand: 15.89 sec with UE support Timed up and go (TUG): 12.95 sec patient caught right foot behind left. Decreased speed of turn  10/09/2023: 6 minute walk test:  1000 ft with RPE of 5-6/10 and pain increasing to 5/10 (age related norms are 1,155 ft)  GAIT: Comments: decreased cadence; Wide BOS  TREATMENT DATE:  10/21/23 Nustep level 9 x5 min with PT present to discuss status Hookyling alt hand and knee press with purple ball x 10 each side Hooklying TA activation + ball squeeze 2 x 10 Sit to stand holding 5# KB 2 x 10 Seated chest press with 5# KB x 10 Seated reclined crunch holding 5# KB x 10 Hurdles (forwards & sideways) x 4 laps step over pattern trying not to use UE support. This was challenging due to patient's vision. Did one set holding 3# DB 6 inch step ups x 12 bilateral  Single leg on therapad + opposite leg swings (forwards, sideways, backwards) x 10 bilateral  Standing row & extension with red TB x 12 [employer] carries 4# DB  to cancer gym  and back    10/17/23 Nustep level 5 x6 min with PT present to discuss status Seated TA activation with small purple ball 2 x 10 Seated TA activation + ball squeeze x 20 Seated LAQ AROM x10, with ankle weights 1# x10 Prone hamstring pull 2x10 with ankle weights each leg- hurt shoulder, moved to standing Cone tapping, 2 cones for single limb balance x10 Sit to stand x 10 -back started to hurt Seated OH press with 5# KB x10 Seated chest press with 5# KB x 10 Seated around the worlds using 2# x10  Seated AROM shoulder retraction 2x10 Hurdles (forwards & sideways) x 4 laps each    10/15/2023 Nustep level 5 x6 min with PT present to discuss status Seated TA activation with small purple ball 2 x 10 Seated TA activation + ball squeeze x 20 Sit to stand x 10 then x 10 holding 5# KB Seated chest press with 5# KB x 10 Standing row with red theraband 2x10 Seated hamstring curl with red tband 2x10 bilat Single leg on theraband forward & lateral opposite leg swing x 8 bilateral  Weight shifts in airex (medial/lateral) x 1 min Seated lateral sidebend with overhead reach over peanut ball x10 bilat 6inch step taps unilateral UE support x 20 Hurdles (forwards & sideways) x 4 laps each Farmer's carries 4# DB to cancer gym and back Gait training to prevent left hip adduction when walking. Patient verbalized this trips her up sometimes.    10/09/2023 Nustep level 3 x3 min with PT present to discuss status 6 minute walk test:  1,000 ft with RPE of 5-6/10 and pain increasing to 5/10 Seated hip abduction with red tband 2x10 Seated marching with red tband around thighs 2x10 Seated hamstring curl with red tband 2x10 bilat Seated lateral sidebend with overhead reach over peanut ball x10 bilat Seated row with red theraband 2x10 Seated smooth pursuit for vertical and horizontal x10 each Seated smooth pursuit for pencil push-up x10   PATIENT EDUCATION:  Education details: Provided  patient education and discussion on quality of life and stress management. Discussed motivators, challenges, and goals for future.  Person educated: Patient Education method: Explanation, Demonstration, and Handouts Education comprehension: verbalized understanding, returned demonstration, and needs further education  HOME EXERCISE PROGRAM: Access Code: [email or web address] URL: [email or web address] Date: 10/09/2023 Prepared by: Jarrell Menke  Exercises - Supine Lower Trunk Rotation  - 1 x daily - 7 x weekly - 1 sets - 8 reps - 4s hold - Supine Alternating Knee Taps with Hands  - 1 x daily - 7 x weekly - 1 sets - 10 reps - 3s hold - Sit to Stand with Armchair  - 1 x daily - 7 x weekly - 1-2 sets - 10 reps - Seated Hip Abduction with Resistance  - 1 x daily - 7 x weekly - 2 sets - 10 reps - Seated March with Resistance  - 1 x daily - 7 x weekly - 2 sets - 10 reps - Standing Shoulder Row with Anchored Resistance  - 1 x daily - 7 x weekly - 2 sets - 10 reps - Seated Horizontal Smooth Pursuit  - 1 x daily - 7 x weekly - 2 sets - 10 reps - Seated Vertical Smooth Pursuit  - 1 x daily - 7 x weekly - 2 sets - 10 reps - Seated Proximal-Distal Smooth Pursuit  - 1 x daily - 7 x weekly - 2 sets - 10 reps  ASSESSMENT:  CLINICAL IMPRESSION:  Treatment session focused on core activation and single leg balance. Ms. Desteni continues to verbalized occasional back pain. Educated patient that due to her scoliosis her back pain might flare up every so often and the importance of overall functional strength. Challenged patient to decreased UE support while performing balance activities. She was a little apprehensive of this due to her vision. She verbalized trying to improve her gait mechanics that we reviewed last session. Patient will benefit from skilled PT to address the below impairments and improve overall function.    OBJECTIVE IMPAIRMENTS: Abnormal gait, decreased balance, decreased endurance,  decreased mobility, difficulty walking, decreased ROM, decreased strength, hypomobility, increased muscle spasms, impaired flexibility, postural dysfunction, and pain.   ACTIVITY LIMITATIONS: carrying, lifting, bending, sitting, standing, squatting, stairs, transfers, bed mobility, and locomotion level  PARTICIPATION LIMITATIONS: meal prep, cleaning, laundry, interpersonal relationship, driving, and community activity  PERSONAL FACTORS: Age, Fitness, Time since onset of injury/illness/exacerbation, and 1 comorbidity: Scoliosis are also affecting patient's functional outcome.   REHAB POTENTIAL: Good  CLINICAL DECISION MAKING: Stable/uncomplicated  EVALUATION COMPLEXITY: Moderate   GOALS: Goals reviewed with patient? Yes  SHORT TERM GOALS: Target date: 10/24/2023 Patient will be independent with initial HEP. Baseline:  Goal status: Met on 10/09/23  2.  Patient will be able to participate in a to establish a baseline for functional test. Baseline:  Goal status: Met on 10/09/23 (1,000 ft with RPE of 5-6/10 and increased pain)   LONG TERM GOALS: Target date: 11/21/2023  Patient will demonstrate independence in advanced HEP. Baseline:  Goal status: INITIAL  2.  Patient will report > or = to 60% improvement in back pain since starting PT. Baseline:  Goal status: Progressing   3.  Patient will verbalize and demonstrate self-care strategies to manage pain including tissue mobility practices and change of position. Baseline:  Goal status: INITIAL   4.  Patient will be able to walk across the street in her retirement facility with no back pain for improved functional mobility. Baseline:  Goal status: Progressing   5.  Patient will be able to carry at least 5# to help her ability to bring in groceries safely. Baseline:  Goal status: Progressing   6.  Patient will be able to ambulate at least 864ft with LRAD to improve ability to ambulate community distances safely. Baseline:   Goal status: INITIAL  PLAN:  PT FREQUENCY: 2x/week  PT DURATION: 8 weeks  PLANNED INTERVENTIONS: 97164- PT Re-evaluation, 97110-Therapeutic exercises, 97530- Therapeutic activity, [id number]- Neuromuscular re-education, 97535- Self Care, [id number]- Manual therapy, [id number]- Gait training, [phone]- Canalith repositioning, [id number]- Aquatic Therapy, [phone]- Electrical stimulation (unattended), [phone]- Electrical stimulation (manual), [id number]- Vasopneumatic device, [id number]- Traction (mechanical), [id number]- Ionotophoresis 4mg /ml Dexamethasone, 79439 (1-2 muscles), [id number] (3+ muscles)- Dry Needling, Patient/Family education, Balance training, Stair training, Taping, Joint mobilization, Joint manipulation, Vestibular training, Cryotherapy, and Moist heat.  PLAN FOR NEXT SESSION: single leg balance; core strengthening; gait training to prevent left hip adduction, quad activation      Kristeen Sar, PT 10/21/23 10:17 AM [facility] [address] [place], [place] [address] Phone # [phone] Fax [phone]

## 2023-10-23 ENCOUNTER — Ambulatory Visit: Admitting: Physical Therapy

## 2023-10-23 ENCOUNTER — Encounter: Payer: Self-pay | Admitting: Physical Therapy

## 2023-10-23 DIAGNOSIS — R2681 Unsteadiness on feet: Secondary | ICD-10-CM

## 2023-10-23 DIAGNOSIS — M5459 Other low back pain: Secondary | ICD-10-CM | POA: Diagnosis not present

## 2023-10-23 DIAGNOSIS — M6281 Muscle weakness (generalized): Secondary | ICD-10-CM

## 2023-10-23 NOTE — Therapy (Signed)
 OUTPATIENT PHYSICAL THERAPY TREATMENT NOTE   Patient Name: Sabrina Castaneda MRN: [id number] DOB:March 20, 1935, 88 y.o., female Today's Date: 10/23/2023  END OF SESSION:  PT End of Session - 10/23/23 1116     Visit Number 6    Date for Recertification  11/21/23    Authorization Type [employer]    Progress Note Due on Visit 10    PT Start Time 1020    PT Stop Time 1100    PT Time Calculation (min) 40 min    Activity Tolerance Patient tolerated treatment well    Behavior During Therapy [facility] for tasks assessed/performed              Past Medical History:  Diagnosis Date   Allergy    GERD (gastroesophageal reflux disease)    History of chicken pox    Hyperlipidemia    Scoliosis    Thyroid  disease    Past Surgical History:  Procedure Laterality Date   ABDOMINAL HYSTERECTOMY  1980   ADENOIDECTOMY     HERNIA REPAIR  02/04/1966   REPLACEMENT TOTAL KNEE Right    TONSILLECTOMY     WRIST FRACTURE SURGERY Bilateral    Patient Active Problem List   Diagnosis Date Noted   Acquired trigger finger of left middle finger 03/11/2023   Neck pain 12/09/2022   Cervical radiculopathy 11/18/2022   Scoliosis of thoracic spine 11/18/2022   Depression 03/14/2020   Overweight (BMI 25.0-29.9) 03/14/2020   Physical exam 09/10/2019   Hyperlipidemia 08/16/2019   Hypothyroid 08/16/2019    PCP:  Mahlon Comer BRAVO, MD  REFERRING PROVIDER: Laqueta Ozell BIRCH, MD  REFERRING DIAG: Joint Pain  Rationale for Evaluation and Treatment: Rehabilitation  THERAPY DIAG:  Other low back pain  Muscle weakness (generalized)  Unsteadiness on feet  ONSET DATE: May 2025  SUBJECTIVE:                                                                                                                                                                                           SUBJECTIVE STATEMENT: Patient reports she is doing good today. She still has occasional low back pain. She got a lift for her left  leg this morning.  PERTINENT HISTORY:  Scoliosis; Hx right knee replacement; hx cervical fusion, pt reports diplopia   PAIN:  Are you having pain? Yes: NPRS scale: 4/10 Pain location: left sided lumbar pain but has moved to the right side now Pain description: stabbing pain that wouldn't go away Aggravating factors: walking across the street (she lives in [place]) Relieving factors: Nothing  PRECAUTIONS: None  RED FLAGS: None   WEIGHT BEARING RESTRICTIONS:  No  FALLS:  Has patient fallen in last 6 months? No but patient does have a fear of falling  LIVING ENVIRONMENT: Lives with: lives alone in [place]  Lives in: House/apartment Stairs: No Has following equipment at home: Single point cane  OCCUPATION: Retired  PLOF: Independent, Independent with basic ADLs, Independent with household mobility without device, Independent with community mobility without device, Independent with gait, Independent with transfers, and Leisure: Playing cards, hanging out friends, events at [employer], reading  PATIENT GOALS: Find something to strengthen the muscles and relieve the pain  NEXT MD VISIT: PRN  OBJECTIVE:  Note: Objective measures were completed at Evaluation unless otherwise noted.  DIAGNOSTIC FINDINGS:  From 09/16/2023 MD Note: X-ray lumbar spine (08/25/2023): 5 lumbar-type vertebral bodies with severe levoconvex lumbar scoliosis. Grade 1 anterolisthesis of L3 on L4 and L4 on L5. Severe disc space narrowing at L4-5 and notable at L3-4 and L5-S1. Facet arthrosis noted throughout the lower lumbar spine.    MRI Cervical spine (01/13/2023): C2-3 moderate left and moderate-severe right foraminal stenosis. C3-4 moderate biforaminal stenosis. C4-5 with moderate left foraminal stenosis and C5-6 with moderate right foraminal stenosis.   PATIENT SURVEYS:  Modified Oswestry: 9/50 18%  Minimally Clinically Important Difference (MCID) = 12.8%  COGNITION: Overall cognitive status:  Within functional limits for tasks assessed     SENSATION: WFL  MUSCLE LENGTH: Hamstrings: WFL   POSTURE: rounded shoulders and forward head With scoliosis thoracic curve concave to the left  PALPATION: No tenderness in bilateral glutes or paraspinals  LUMBAR ROM: *pain  AROM eval  Flexion Can touch toes  Extension 90% limited *  Right lateral flexion Bends to knee joint  Left lateral flexion Bends to above knee joint *  Right rotation full  Left rotation 80% limited   (Blank rows = not tested)  LOWER EXTREMITY ROM: PROM:   Limited hip IR bilateral ; unable to achieve FABER position on Rt    LOWER EXTREMITY MMT:    MMT Right eval Left eval  Hip flexion 4- 4-  Hip extension    Hip abduction 4- 4-  Hip adduction 4 4  Hip internal rotation    Hip external rotation    Knee flexion 4 4  Knee extension 4 4  Ankle dorsiflexion    Ankle plantarflexion    Ankle inversion    Ankle eversion     (Blank rows = not tested)   FUNCTIONAL TESTS:  Eval: 5 times sit to stand: 15.89 sec with UE support Timed up and go (TUG): 12.95 sec patient caught right foot behind left. Decreased speed of turn  10/09/2023: 6 minute walk test:  1000 ft with RPE of 5-6/10 and pain increasing to 5/10 (age related norms are 1,155 ft)  GAIT: Comments: decreased cadence; Wide BOS  TREATMENT DATE:  10/23/23 Nustep level 9 x5 min with PT present to discuss status Side side stepping with red loop x 3 laps at barre Hurdles (forwards & sideways) x 4 laps step over pattern light UE support. Holding 5# KB 6 inch step ups holding 5# KB 2 x 10 bilateral (unilateral UE support at barre) [employer] carries 4# DB to cancer gym and back x 2 laps Pallof press with red TB x 12 (only on Rt ) Standing shoulder row with red TB 2 x 10 Standing shoulder extension  with red TB 2 x 10 Patient education on pursed lip breathing when walking and using hand has a tactile cue to breath into her right  lung. Sit to  stand holding 5# KB 2 x 10 (no weight the last 6 reps) Seated side stretch with peanut ball 5 sec x 5 bilateral  Seated shoulder circles x 10 each direction    10/21/23 Nustep level 9 x5 min with PT present to discuss status Hookyling alt hand and knee press with purple ball x 10 each side Hooklying TA activation + ball squeeze 2 x 10 Sit to stand holding 5# KB 2 x 10 Seated chest press with 5# KB x 10 Seated reclined crunch holding 5# KB x 10 Hurdles (forwards & sideways) x 4 laps step over pattern trying not to use UE support. This was challenging due to patient's vision. Did one set holding 3# DB 6 inch step ups x 12 bilateral  Single leg on therapad + opposite leg swings (forwards, sideways, backwards) x 10 bilateral  Standing row & extension with red TB x 12 [employer] carries 4# DB to cancer gym and back    10/17/23 Nustep level 5 x6 min with PT present to discuss status Seated TA activation with small purple ball 2 x 10 Seated TA activation + ball squeeze x 20 Seated LAQ AROM x10, with ankle weights 1# x10 Prone hamstring pull 2x10 with ankle weights each leg- hurt shoulder, moved to standing Cone tapping, 2 cones for single limb balance x10 Sit to stand x 10 -back started to hurt Seated OH press with 5# KB x10 Seated chest press with 5# KB x 10 Seated around the worlds using 2# x10  Seated AROM shoulder retraction 2x10 Hurdles (forwards & sideways) x 4 laps each     PATIENT EDUCATION:  Education details: Provided patient education and discussion on quality of life and stress management. Discussed motivators, challenges, and goals for future.  Person educated: Patient Education method: Explanation, Demonstration, and Handouts Education comprehension: verbalized understanding, returned demonstration, and needs further education  HOME EXERCISE PROGRAM: Access Code: [email or web address] URL: [email or web address] Date: 10/09/2023 Prepared by: Jarrell  Menke  Exercises - Supine Lower Trunk Rotation  - 1 x daily - 7 x weekly - 1 sets - 8 reps - 4s hold - Supine Alternating Knee Taps with Hands  - 1 x daily - 7 x weekly - 1 sets - 10 reps - 3s hold - Sit to Stand with Armchair  - 1 x daily - 7 x weekly - 1-2 sets - 10 reps - Seated Hip Abduction with Resistance  - 1 x daily - 7 x weekly - 2 sets - 10 reps - Seated March with Resistance  - 1 x daily - 7 x weekly - 2 sets - 10 reps - Standing Shoulder Row with Anchored Resistance  - 1 x daily - 7 x weekly - 2 sets - 10 reps - Seated Horizontal Smooth Pursuit  - 1 x daily - 7 x weekly - 2 sets - 10 reps - Seated Vertical Smooth Pursuit  - 1 x daily - 7 x weekly - 2 sets - 10 reps - Seated Proximal-Distal Smooth Pursuit  - 1 x daily - 7 x weekly - 2 sets - 10 reps  ASSESSMENT:  CLINICAL IMPRESSION: Treatment session focused on postural strengthening and standing tolerance. Patient verbalized feeling short of breath recently. Educated patient on the use of pursed lip breathing when she feels short of breath, and placing her left hand to her right rib as a cue to really expand her right lung. She demonstrated some fatigue with exercise and PT encouraged patient  to take rest breaks. With hurdles exercise patient caught her foot two times but was able to self recover balance.  Patient will benefit from skilled PT to address the below impairments and improve overall function.     OBJECTIVE IMPAIRMENTS: Abnormal gait, decreased balance, decreased endurance, decreased mobility, difficulty walking, decreased ROM, decreased strength, hypomobility, increased muscle spasms, impaired flexibility, postural dysfunction, and pain.   ACTIVITY LIMITATIONS: carrying, lifting, bending, sitting, standing, squatting, stairs, transfers, bed mobility, and locomotion level  PARTICIPATION LIMITATIONS: meal prep, cleaning, laundry, interpersonal relationship, driving, and community activity  PERSONAL FACTORS: Age,  Fitness, Time since onset of injury/illness/exacerbation, and 1 comorbidity: Scoliosis are also affecting patient's functional outcome.   REHAB POTENTIAL: Good  CLINICAL DECISION MAKING: Stable/uncomplicated  EVALUATION COMPLEXITY: Moderate   GOALS: Goals reviewed with patient? Yes  SHORT TERM GOALS: Target date: 10/24/2023 Patient will be independent with initial HEP. Baseline:  Goal status: Met on 10/09/23  2.  Patient will be able to participate in a to establish a baseline for functional test. Baseline:  Goal status: Met on 10/09/23 (1,000 ft with RPE of 5-6/10 and increased pain)   LONG TERM GOALS: Target date: 11/21/2023  Patient will demonstrate independence in advanced HEP. Baseline:  Goal status: INITIAL  2.  Patient will report > or = to 60% improvement in back pain since starting PT. Baseline:  Goal status: Progressing   3.  Patient will verbalize and demonstrate self-care strategies to manage pain including tissue mobility practices and change of position. Baseline:  Goal status: INITIAL   4.  Patient will be able to walk across the street in her retirement facility with no back pain for improved functional mobility. Baseline:  Goal status: Progressing   5.  Patient will be able to carry at least 5# to help her ability to bring in groceries safely. Baseline:  Goal status: Progressing   6.  Patient will be able to ambulate at least 857ft with LRAD to improve ability to ambulate community distances safely. Baseline:  Goal status: INITIAL  PLAN:  PT FREQUENCY: 2x/week  PT DURATION: 8 weeks  PLANNED INTERVENTIONS: 97164- PT Re-evaluation, 97110-Therapeutic exercises, 97530- Therapeutic activity, 97112- Neuromuscular re-education, 97535- Self Care, [id number]- Manual therapy, [id number]- Gait training, [phone]- Canalith repositioning, [id number]- Aquatic Therapy, [phone]- Electrical stimulation (unattended), [phone]- Electrical stimulation (manual), [id number]- Vasopneumatic  device, [id number]- Traction (mechanical), [id number]- Ionotophoresis 4mg /ml Dexamethasone, 79439 (1-2 muscles), [id number] (3+ muscles)- Dry Needling, Patient/Family education, Balance training, Stair training, Taping, Joint mobilization, Joint manipulation, Vestibular training, Cryotherapy, and Moist heat.  PLAN FOR NEXT SESSION: single leg balance; core strengthening; gait training to prevent left hip adduction, quad activation      Kristeen Sar, PT 10/23/23 11:17 AM [facility] [address] [place], [place] [address] Phone # [phone] Fax [phone]

## 2023-10-28 ENCOUNTER — Ambulatory Visit: Admitting: Physical Therapy

## 2023-10-30 ENCOUNTER — Ambulatory Visit: Admitting: Physical Therapy

## 2023-11-04 ENCOUNTER — Encounter: Payer: Self-pay | Admitting: Rehabilitative and Restorative Service Providers"

## 2023-11-04 ENCOUNTER — Ambulatory Visit: Admitting: Rehabilitative and Restorative Service Providers"

## 2023-11-04 ENCOUNTER — Telehealth: Payer: Self-pay

## 2023-11-04 DIAGNOSIS — M5459 Other low back pain: Secondary | ICD-10-CM | POA: Diagnosis not present

## 2023-11-04 DIAGNOSIS — R2681 Unsteadiness on feet: Secondary | ICD-10-CM

## 2023-11-04 DIAGNOSIS — M6281 Muscle weakness (generalized): Secondary | ICD-10-CM

## 2023-11-04 NOTE — Telephone Encounter (Signed)
 Called patient to get more clarification. Patient said she is confused because she thought she needed labs in order to get refills. I asked her what refills she is needing she said she would have to call me later. But if she does not need labs done to get refills, she will wait to get her labs in November at her physical.

## 2023-11-04 NOTE — Telephone Encounter (Signed)
 Patient is requesting to have labs drawn to continue to get medication refilled. I cannot find what labs? I make follow up appointment if needed. Please advise, thank you

## 2023-11-04 NOTE — Telephone Encounter (Signed)
 She has a physical scheduled in November.  Does she want labs done before this or does she want to wait until we see her and do them that day?

## 2023-11-04 NOTE — Telephone Encounter (Signed)
 Copied from CRM [id number]. Topic: Clinical - Request for Lab/Test Order >> Nov 04, 2023  9:22 AM Sabrina Castaneda wrote: Reason for CRM: Patient would like orders placed. Patient states she normally gets this blood work completed so she can continue to received refills on her medications.

## 2023-11-04 NOTE — Therapy (Signed)
 OUTPATIENT PHYSICAL THERAPY TREATMENT NOTE AND REASSESSMENT NOTE   Patient Name: Sabrina Castaneda MRN: [id number] DOB:10-Nov-1935, 88 y.o., female Today's Date: 11/04/2023  Progress Note Reporting Period 09/26/2023 to 11/04/2023  See note below for Objective Data and Assessment of Progress/Goals.       END OF SESSION:  PT End of Session - 11/04/23 0929     Visit Number 7    Date for Recertification  01/02/24    Authorization Type [employer]    Progress Note Due on Visit 17    PT Start Time 0929    PT Stop Time 1010    PT Time Calculation (min) 41 min    Activity Tolerance Patient tolerated treatment well    Behavior During Therapy [facility] for tasks assessed/performed              Past Medical History:  Diagnosis Date   Allergy    GERD (gastroesophageal reflux disease)    History of chicken pox    Hyperlipidemia    Scoliosis    Thyroid  disease    Past Surgical History:  Procedure Laterality Date   ABDOMINAL HYSTERECTOMY  1980   ADENOIDECTOMY     HERNIA REPAIR  02/04/1966   REPLACEMENT TOTAL KNEE Right    TONSILLECTOMY     WRIST FRACTURE SURGERY Bilateral    Patient Active Problem List   Diagnosis Date Noted   Acquired trigger finger of left middle finger 03/11/2023   Neck pain 12/09/2022   Cervical radiculopathy 11/18/2022   Scoliosis of thoracic spine 11/18/2022   Depression 03/14/2020   Overweight (BMI 25.0-29.9) 03/14/2020   Physical exam 09/10/2019   Hyperlipidemia 08/16/2019   Hypothyroid 08/16/2019    PCP:  Mahlon Comer BRAVO, MD  REFERRING PROVIDER: Laqueta Ozell BIRCH, MD  REFERRING DIAG: Joint Pain  Rationale for Evaluation and Treatment: Rehabilitation  THERAPY DIAG:  Other low back pain  Muscle weakness (generalized)  Unsteadiness on feet  ONSET DATE: May 2025  SUBJECTIVE:                                                                                                                                                                                            SUBJECTIVE STATEMENT: Patient reports she had a fall about a week and a half ago and injured her right shoulder.  She went to see Grayce Remington, PA-C at [facility] and they wanted her to continue PT and perform ROM to right shoulder.  Patient does report that her dizziness has improved some.  PERTINENT HISTORY:  Scoliosis; Hx right knee replacement; hx cervical fusion, pt reports diplopia   PAIN:  Are  you having pain? Yes: NPRS scale: 6-7/10 Pain location: right shoulder Pain description: stabbing pain that wouldn't go away Aggravating factors: walking across the street (she lives in [place]) Relieving factors: Nothing  PRECAUTIONS: Fall  RED FLAGS: None   WEIGHT BEARING RESTRICTIONS: No  FALLS:  Has patient fallen in last 6 months? Yes. Number of falls 1 fall in September 2025   LIVING ENVIRONMENT: Lives with: lives alone in [place]  Lives in: House/apartment Stairs: No Has following equipment at home: Single point cane  OCCUPATION: Retired  PLOF: Independent, Independent with basic ADLs, Independent with household mobility without device, Independent with community mobility without device, Independent with gait, Independent with transfers, and Leisure: Playing cards, hanging out friends, events at [employer], reading  PATIENT GOALS: Find something to strengthen the muscles and relieve the pain  NEXT MD VISIT: PRN  OBJECTIVE:  Note: Objective measures were completed at Evaluation unless otherwise noted.  DIAGNOSTIC FINDINGS:  From 09/16/2023 MD Note: X-ray lumbar spine (08/25/2023): 5 lumbar-type vertebral bodies with severe levoconvex lumbar scoliosis. Grade 1 anterolisthesis of L3 on L4 and L4 on L5. Severe disc space narrowing at L4-5 and notable at L3-4 and L5-S1. Facet arthrosis noted throughout the lower lumbar spine.    MRI Cervical spine (01/13/2023): C2-3 moderate left and moderate-severe right foraminal stenosis. C3-4 moderate  biforaminal stenosis. C4-5 with moderate left foraminal stenosis and C5-6 with moderate right foraminal stenosis.   PATIENT SURVEYS:  Eval:  Modified Oswestry: 9/50 18%  Minimally Clinically Important Difference (MCID) = 12.8%  11/04/2023: Quick DASH 52.27 Modified Oswestry Low Back Pain Disability Questionnaire: 12 / 50 = 24.0 %  COGNITION: Overall cognitive status: Within functional limits for tasks assessed     SENSATION: WFL  MUSCLE LENGTH: Hamstrings: WFL   POSTURE: rounded shoulders and forward head With scoliosis thoracic curve concave to the left  PALPATION: No tenderness in bilateral glutes or paraspinals  LUMBAR ROM: *pain  AROM eval  Flexion Can touch toes  Extension 90% limited *  Right lateral flexion Bends to knee joint  Left lateral flexion Bends to above knee joint *  Right rotation full  Left rotation 80% limited   (Blank rows = not tested)  LOWER EXTREMITY ROM:   Eval: PROM:   Limited hip IR bilateral ; unable to achieve FABER position on Rt   UPPER EXTREMITY A/ROM:  11/04/2022: Right shoulder flexion: 110 deg Right shoulder abduction:  140 deg  UPPER EXTREMITY MMT:  11/04/2023: Left shoulder strength 4+ to 5-/5 grossly throughout Right shoulder strength grossly 4/5 throughout   LOWER EXTREMITY MMT:    MMT Right eval Left eval  Hip flexion 4- 4-  Hip extension    Hip abduction 4- 4-  Hip adduction 4 4  Hip internal rotation    Hip external rotation    Knee flexion 4 4  Knee extension 4 4  Ankle dorsiflexion    Ankle plantarflexion    Ankle inversion    Ankle eversion     (Blank rows = not tested)   FUNCTIONAL TESTS:  Eval: 5 times sit to stand: 15.89 sec with UE support Timed up and go (TUG): 12.95 sec patient caught right foot behind left. Decreased speed of turn  10/09/2023: 6 minute walk test:  1000 ft with RPE of 5-6/10 and pain increasing to 5/10 (age related norms are 1,155 ft)  GAIT: Comments: decreased cadence;  Wide BOS  TREATMENT DATE:   11/04/2023: Nustep level 5 x6 min with PT present to  discuss status Right shoulder assessment Seated green pball rollout x10 Standing shoulder flexion wall slide 2x10 right UE Supine shoulder flexion with dowel rod 2x10 Supine chest press with dowel rod 2x10 Supine lower trunk rotation x15 Quick DASH and Modified Oswestry   10/23/23 Nustep level 9 x5 min with PT present to discuss status Side side stepping with red loop x 3 laps at barre Hurdles (forwards & sideways) x 4 laps step over pattern light UE support. Holding 5# KB 6 inch step ups holding 5# KB 2 x 10 bilateral (unilateral UE support at barre) Farmer's carries 4# DB to cancer gym and back x 2 laps Pallof press with red TB x 12 (only on Rt ) Standing shoulder row with red TB 2 x 10 Standing shoulder extension  with red TB 2 x 10 Patient education on pursed lip breathing when walking and using hand has a tactile cue to breath into her right lung. Sit to stand holding 5# KB 2 x 10 (no weight the last 6 reps) Seated side stretch with peanut ball 5 sec x 5 bilateral  Seated shoulder circles x 10 each direction    10/21/23 Nustep level 9 x5 min with PT present to discuss status Hookyling alt hand and knee press with purple ball x 10 each side Hooklying TA activation + ball squeeze 2 x 10 Sit to stand holding 5# KB 2 x 10 Seated chest press with 5# KB x 10 Seated reclined crunch holding 5# KB x 10 Hurdles (forwards & sideways) x 4 laps step over pattern trying not to use UE support. This was challenging due to patient's vision. Did one set holding 3# DB 6 inch step ups x 12 bilateral  Single leg on therapad + opposite leg swings (forwards, sideways, backwards) x 10 bilateral  Standing row & extension with red TB x 12 Farmer's carries 4# DB to cancer gym and back     PATIENT EDUCATION:  Education details: Provided patient education and discussion on quality of life and stress management.  Discussed motivators, challenges, and goals for future.  Person educated: Patient Education method: Explanation, Demonstration, and Handouts Education comprehension: verbalized understanding, returned demonstration, and needs further education  HOME EXERCISE PROGRAM: Access Code: [email or web address] URL: [email or web address] Date: 11/04/2023 Prepared by: Jarrell Lashica Hannay  Exercises - Supine Lower Trunk Rotation  - 1 x daily - 7 x weekly - 1 sets - 8 reps - 4s hold - Supine Alternating Knee Taps with Hands  - 1 x daily - 7 x weekly - 1 sets - 10 reps - 3s hold - Sit to Stand with Armchair  - 1 x daily - 7 x weekly - 1-2 sets - 10 reps - Seated Hip Abduction with Resistance  - 1 x daily - 7 x weekly - 2 sets - 10 reps - Seated March with Resistance  - 1 x daily - 7 x weekly - 2 sets - 10 reps - Standing Shoulder Row with Anchored Resistance  - 1 x daily - 7 x weekly - 2 sets - 10 reps - Seated Horizontal Smooth Pursuit  - 1 x daily - 7 x weekly - 2 sets - 10 reps - Seated Proximal-Distal Smooth Pursuit  - 1 x daily - 7 x weekly - 2 sets - 10 reps - Shoulder Flexion Wall Slide with Towel  - 1 x daily - 7 x weekly - 2 sets - 10 reps - Supine Shoulder Flexion with Dowel  - 1 x daily -  7 x weekly - 2 sets - 10 reps  ASSESSMENT:  CLINICAL IMPRESSION:  Ms Steinmeyer presents to skilled PT reporting that she had a fall about a week and a half ago.  She reports going to [facility] last week, where she saw Grayce Remington, PA-C.  Per the visit notes in Epic, there was an order placed for PT on right shoulder.  Patient presents to PT session today and was re-assessed to include the treatment of her right shoulder.  Patient does report that her dizziness has improved some since earlier in therapy.  She states that she continues to feel that she is off-balance and remains a fall risk and admits to self-limiting her walking and activities, partially due to fear of falling.  Patient has decreased right shoulder  A/ROM and muscle weakness noted.  Given her recent fall with injury, it was felt at this time that patient would benefit from a reassessment to add in her shoulder pain (per the request of Grayce Remington, PA-C) and to continue her PT.  Patient would benefit from continued PT of 1-2x/week for 8 additional weeks to further progress towards decreased fall risk and to be more independent in her home/community.   OBJECTIVE IMPAIRMENTS: Abnormal gait, decreased balance, decreased endurance, decreased mobility, difficulty walking, decreased ROM, decreased strength, hypomobility, increased muscle spasms, impaired flexibility, postural dysfunction, and pain.   ACTIVITY LIMITATIONS: carrying, lifting, bending, sitting, standing, squatting, stairs, transfers, bed mobility, and locomotion level  PARTICIPATION LIMITATIONS: meal prep, cleaning, laundry, interpersonal relationship, driving, and community activity  PERSONAL FACTORS: Age, Fitness, Time since onset of injury/illness/exacerbation, and 1 comorbidity: Scoliosis are also affecting patient's functional outcome.   REHAB POTENTIAL: Good  CLINICAL DECISION MAKING: Evolving/moderate complexity  EVALUATION COMPLEXITY: Moderate   GOALS: Goals reviewed with patient? Yes  SHORT TERM GOALS: Target date: 10/24/2023 Patient will be independent with initial HEP. Baseline:  Goal status: Met on 10/09/23  2.  Patient will be able to participate in a to establish a baseline for functional test. Baseline:  Goal status: Met on 10/09/23 (1,000 ft with RPE of 5-6/10 and increased pain)   LONG TERM GOALS: Target date: 01/02/2024  Patient will demonstrate independence in advanced HEP. Baseline:  Goal status: Ongoing  2.  Patient will report > or = to 60% improvement in back and shoulder pain since starting PT. Baseline:  Goal status: Progressing   3.  Patient will verbalize and demonstrate self-care strategies to manage pain including tissue mobility  practices and change of position. Baseline:  Goal status: Ongoing  4.  Patient will be able to walk across the street in her retirement facility with no back pain for improved functional mobility. Baseline:  Goal status: Progressing   5.  Patient will be able to carry at least 5# to help her ability to bring in groceries safely and without a loss of balance. Baseline:  Goal status: Progressing   6.  Patient will be able to ambulate at least 837ft with LRAD without a loss of balance to improve ability to ambulate community distances safely. Baseline:  Goal status: Ongoing  7.  Patient will be increase right shoulder A/ROM to [facility] to allow her to place items in overhead cabinets/shelves. Baseline:  right shoulder flexion 110 degrees on 11/04/23 Goal status:  NEW on 11/04/2023  PLAN:  PT FREQUENCY: 1-2x/week  PT DURATION: 8 weeks  PLANNED INTERVENTIONS: 97164- PT Re-evaluation, 97750- Physical Performance Testing, 97110-Therapeutic exercises, 97530- Therapeutic activity, [id number]- Neuromuscular re-education, 97535- Self Care,  02859- Manual therapy, [id number]- Gait training, 04007- Canalith repositioning, 02886- Aquatic Therapy, [id number]- Electrical stimulation (unattended), [phone]- Electrical stimulation (manual), [id number]- Vasopneumatic device, [id number]- Traction (mechanical), [id number]- Ionotophoresis 4mg /ml Dexamethasone, 20560 (1-2 muscles), 20561 (3+ muscles)- Dry Needling, Patient/Family education, Balance training, Stair training, Taping, Joint mobilization, Joint manipulation, Spinal manipulation, Spinal mobilization, Vestibular training, Cryotherapy, and Moist heat.  PLAN FOR NEXT SESSION: Assess and progress HEP as indicated, strengthening, flexibility/ROM, balance     Jarrell Laming, PT, DPT 11/04/23, 10:23 AM  [facility] [address], [address] [place], [place] [address] Phone # [phone] Fax [phone]

## 2023-11-04 NOTE — Telephone Encounter (Signed)
 Ok to provide refills and she can have her labs done at her upcoming CPE

## 2023-11-05 ENCOUNTER — Other Ambulatory Visit: Payer: Self-pay | Admitting: Family Medicine

## 2023-11-06 ENCOUNTER — Ambulatory Visit

## 2023-11-06 ENCOUNTER — Encounter: Payer: Self-pay | Admitting: Physical Therapy

## 2023-11-06 ENCOUNTER — Ambulatory Visit (INDEPENDENT_AMBULATORY_CARE_PROVIDER_SITE_OTHER)

## 2023-11-06 ENCOUNTER — Ambulatory Visit: Attending: Anesthesiology | Admitting: Physical Therapy

## 2023-11-06 DIAGNOSIS — R2681 Unsteadiness on feet: Secondary | ICD-10-CM | POA: Diagnosis present

## 2023-11-06 DIAGNOSIS — Z23 Encounter for immunization: Secondary | ICD-10-CM

## 2023-11-06 DIAGNOSIS — M5459 Other low back pain: Secondary | ICD-10-CM | POA: Diagnosis present

## 2023-11-06 DIAGNOSIS — M6281 Muscle weakness (generalized): Secondary | ICD-10-CM | POA: Diagnosis present

## 2023-11-06 NOTE — Progress Notes (Signed)
 Patient is in office today for a nurse visit for Influenza Immunization. Patient Injection was given in the  Left deltoid. Patient tolerated injection well.

## 2023-11-06 NOTE — Therapy (Signed)
 OUTPATIENT PHYSICAL THERAPY TREATMENT NOTE   Patient Name: Sabrina Castaneda MRN: [id number] DOB:September 11, 1935, 88 y.o., female Today's Date: 11/06/2023     END OF SESSION:  PT End of Session - 11/06/23 1113     Visit Number 8    Date for Recertification  01/02/24    Authorization Type [employer]    Progress Note Due on Visit 17    PT Start Time 0935    PT Stop Time 1017    PT Time Calculation (min) 42 min    Activity Tolerance Patient tolerated treatment well    Behavior During Therapy [facility] for tasks assessed/performed               Past Medical History:  Diagnosis Date   Allergy    GERD (gastroesophageal reflux disease)    History of chicken pox    Hyperlipidemia    Scoliosis    Thyroid  disease    Past Surgical History:  Procedure Laterality Date   ABDOMINAL HYSTERECTOMY  1980   ADENOIDECTOMY     HERNIA REPAIR  02/04/1966   REPLACEMENT TOTAL KNEE Right    TONSILLECTOMY     WRIST FRACTURE SURGERY Bilateral    Patient Active Problem List   Diagnosis Date Noted   Acquired trigger finger of left middle finger 03/11/2023   Neck pain 12/09/2022   Cervical radiculopathy 11/18/2022   Scoliosis of thoracic spine 11/18/2022   Depression 03/14/2020   Overweight (BMI 25.0-29.9) 03/14/2020   Physical exam 09/10/2019   Hyperlipidemia 08/16/2019   Hypothyroid 08/16/2019    PCP:  Mahlon Comer BRAVO, MD  REFERRING PROVIDER: Laqueta Ozell BIRCH, MD  REFERRING DIAG: Joint Pain  Rationale for Evaluation and Treatment: Rehabilitation  THERAPY DIAG:  Other low back pain  Muscle weakness (generalized)  Unsteadiness on feet  ONSET DATE: May 2025  SUBJECTIVE:                                                                                                                                                                                           SUBJECTIVE STATEMENT: Patient reports she is doing okay today. Yesterday she had increased arm pain at night and it was  hard to get comfortable.  PERTINENT HISTORY:  Scoliosis; Hx right knee replacement; hx cervical fusion, pt reports diplopia   PAIN:  Are you having pain? Yes: NPRS scale: 6-7/10 Pain location: right shoulder Pain description: stabbing pain that wouldn't go away Aggravating factors: walking across the street (she lives in [place]) Relieving factors: Nothing  PRECAUTIONS: Fall  RED FLAGS: None   WEIGHT BEARING RESTRICTIONS: No  FALLS:  Has patient fallen in  last 6 months? Yes. Number of falls 1 fall in September 2025   LIVING ENVIRONMENT: Lives with: lives alone in [place]  Lives in: House/apartment Stairs: No Has following equipment at home: Single point cane  OCCUPATION: Retired  PLOF: Independent, Independent with basic ADLs, Independent with household mobility without device, Independent with community mobility without device, Independent with gait, Independent with transfers, and Leisure: Playing cards, hanging out friends, events at [employer], reading  PATIENT GOALS: Find something to strengthen the muscles and relieve the pain  NEXT MD VISIT: PRN  OBJECTIVE:  Note: Objective measures were completed at Evaluation unless otherwise noted.  DIAGNOSTIC FINDINGS:  From 09/16/2023 MD Note: X-ray lumbar spine (08/25/2023): 5 lumbar-type vertebral bodies with severe levoconvex lumbar scoliosis. Grade 1 anterolisthesis of L3 on L4 and L4 on L5. Severe disc space narrowing at L4-5 and notable at L3-4 and L5-S1. Facet arthrosis noted throughout the lower lumbar spine.    MRI Cervical spine (01/13/2023): C2-3 moderate left and moderate-severe right foraminal stenosis. C3-4 moderate biforaminal stenosis. C4-5 with moderate left foraminal stenosis and C5-6 with moderate right foraminal stenosis.   PATIENT SURVEYS:  Eval:  Modified Oswestry: 9/50 18%  Minimally Clinically Important Difference (MCID) = 12.8%  11/04/2023: Quick DASH 52.27 Modified Oswestry Low Back Pain  Disability Questionnaire: 12 / 50 = 24.0 %  COGNITION: Overall cognitive status: Within functional limits for tasks assessed     SENSATION: WFL  MUSCLE LENGTH: Hamstrings: WFL   POSTURE: rounded shoulders and forward head With scoliosis thoracic curve concave to the left  PALPATION: No tenderness in bilateral glutes or paraspinals  LUMBAR ROM: *pain  AROM eval  Flexion Can touch toes  Extension 90% limited *  Right lateral flexion Bends to knee joint  Left lateral flexion Bends to above knee joint *  Right rotation full  Left rotation 80% limited   (Blank rows = not tested)  LOWER EXTREMITY ROM:   Eval: PROM:   Limited hip IR bilateral ; unable to achieve FABER position on Rt   UPPER EXTREMITY A/ROM:  11/04/2022: Right shoulder flexion: 110 deg Right shoulder abduction:  140 deg  UPPER EXTREMITY MMT:  11/04/2023: Left shoulder strength 4+ to 5-/5 grossly throughout Right shoulder strength grossly 4/5 throughout   LOWER EXTREMITY MMT:    MMT Right eval Left eval  Hip flexion 4- 4-  Hip extension    Hip abduction 4- 4-  Hip adduction 4 4  Hip internal rotation    Hip external rotation    Knee flexion 4 4  Knee extension 4 4  Ankle dorsiflexion    Ankle plantarflexion    Ankle inversion    Ankle eversion     (Blank rows = not tested)   FUNCTIONAL TESTS:  Eval: 5 times sit to stand: 15.89 sec with UE support Timed up and go (TUG): 12.95 sec patient caught right foot behind left. Decreased speed of turn  10/09/2023: 6 minute walk test:  1000 ft with RPE of 5-6/10 and pain increasing to 5/10 (age related norms are 1,155 ft)  GAIT: Comments: decreased cadence; Wide BOS  TREATMENT DATE:  11/06/2023: Nustep level 5 x6 min with PT present to discuss status Patient education on sleeping position 3 way stability ball stretch x 10 each direction Supine shoulder flexion with dowel rod 2x10 Supine chest press with dowel rod x 8 (painful today) Supine  serratus punch x 8 Rt  (painful) Ta activation + ball squeeze x 20 Farmer's carry 3# DB x  2 laps around cancer gym 8inch step taps holding bilateral 3# DB x 20 (caught foot on step once and required support from //bars) Tandem walking on airex pad (forwards & backwards) in // bars x 4 laps Side stepping on airex pad in // bars x 4 laps Sit to stand holding two 3# DB x 12 Standing shoulder row with red TB 2 x 10 Standing shoulder extension with red TB 2 x 10  11/04/2023: Nustep level 5 x6 min with PT present to discuss status Right shoulder assessment Seated green pball rollout x10 Standing shoulder flexion wall slide 2x10 right UE Supine shoulder flexion with dowel rod 2x10 Supine chest press with dowel rod 2x10 Supine lower trunk rotation x15 Quick DASH and Modified Oswestry   10/23/23 Nustep level 9 x5 min with PT present to discuss status Side side stepping with red loop x 3 laps at barre Hurdles (forwards & sideways) x 4 laps step over pattern light UE support. Holding 5# KB 6 inch step ups holding 5# KB 2 x 10 bilateral (unilateral UE support at barre) Farmer's carries 4# DB to cancer gym and back x 2 laps Pallof press with red TB x 12 (only on Rt ) Standing shoulder row with red TB 2 x 10 Standing shoulder extension  with red TB 2 x 10 Patient education on pursed lip breathing when walking and using hand has a tactile cue to breath into her right lung. Sit to stand holding 5# KB 2 x 10 (no weight the last 6 reps) Seated side stretch with peanut ball 5 sec x 5 bilateral  Seated shoulder circles x 10 each direction   PATIENT EDUCATION:  Education details: Provided patient education and discussion on quality of life and stress management. Discussed motivators, challenges, and goals for future.  Person educated: Patient Education method: Explanation, Demonstration, and Handouts Education comprehension: verbalized understanding, returned demonstration, and needs further  education  HOME EXERCISE PROGRAM: Access Code: [email or web address] URL: [email or web address] Date: 11/04/2023 Prepared by: Jarrell Menke  Exercises - Supine Lower Trunk Rotation  - 1 x daily - 7 x weekly - 1 sets - 8 reps - 4s hold - Supine Alternating Knee Taps with Hands  - 1 x daily - 7 x weekly - 1 sets - 10 reps - 3s hold - Sit to Stand with Armchair  - 1 x daily - 7 x weekly - 1-2 sets - 10 reps - Seated Hip Abduction with Resistance  - 1 x daily - 7 x weekly - 2 sets - 10 reps - Seated March with Resistance  - 1 x daily - 7 x weekly - 2 sets - 10 reps - Standing Shoulder Row with Anchored Resistance  - 1 x daily - 7 x weekly - 2 sets - 10 reps - Seated Horizontal Smooth Pursuit  - 1 x daily - 7 x weekly - 2 sets - 10 reps - Seated Proximal-Distal Smooth Pursuit  - 1 x daily - 7 x weekly - 2 sets - 10 reps - Shoulder Flexion Wall Slide with Towel  - 1 x daily - 7 x weekly - 2 sets - 10 reps - Supine Shoulder Flexion with Dowel  - 1 x daily - 7 x weekly - 2 sets - 10 reps  ASSESSMENT:  CLINICAL IMPRESSION: Ms Daughdrill verbalized low shoulder and back pain levels today. She had increased pain last night when she was trying to go to sleep. Educated patient on different sleeping positions that can help  support her shoulder more. She has a little more pain today with supine shoulder ROM exercises. Provided cues to stay in a pain free range. With step taps exercise patient caught foot on step once and required support from // bares to maintain balance. Overall, she tolerated treatment session well. Patient will benefit from skilled PT to address the below impairments and improve overall function.    OBJECTIVE IMPAIRMENTS: Abnormal gait, decreased balance, decreased endurance, decreased mobility, difficulty walking, decreased ROM, decreased strength, hypomobility, increased muscle spasms, impaired flexibility, postural dysfunction, and pain.   ACTIVITY LIMITATIONS: carrying, lifting,  bending, sitting, standing, squatting, stairs, transfers, bed mobility, and locomotion level  PARTICIPATION LIMITATIONS: meal prep, cleaning, laundry, interpersonal relationship, driving, and community activity  PERSONAL FACTORS: Age, Fitness, Time since onset of injury/illness/exacerbation, and 1 comorbidity: Scoliosis are also affecting patient's functional outcome.   REHAB POTENTIAL: Good  CLINICAL DECISION MAKING: Evolving/moderate complexity  EVALUATION COMPLEXITY: Moderate   GOALS: Goals reviewed with patient? Yes  SHORT TERM GOALS: Target date: 10/24/2023 Patient will be independent with initial HEP. Baseline:  Goal status: Met on 10/09/23  2.  Patient will be able to participate in a to establish a baseline for functional test. Baseline:  Goal status: Met on 10/09/23 (1,000 ft with RPE of 5-6/10 and increased pain)   LONG TERM GOALS: Target date: 01/02/2024  Patient will demonstrate independence in advanced HEP. Baseline:  Goal status: Ongoing  2.  Patient will report > or = to 60% improvement in back and shoulder pain since starting PT. Baseline:  Goal status: Progressing   3.  Patient will verbalize and demonstrate self-care strategies to manage pain including tissue mobility practices and change of position. Baseline:  Goal status: Ongoing  4.  Patient will be able to walk across the street in her retirement facility with no back pain for improved functional mobility. Baseline:  Goal status: Progressing   5.  Patient will be able to carry at least 5# to help her ability to bring in groceries safely and without a loss of balance. Baseline:  Goal status: Progressing   6.  Patient will be able to ambulate at least 868ft with LRAD without a loss of balance to improve ability to ambulate community distances safely. Baseline:  Goal status: Ongoing  7.  Patient will be increase right shoulder A/ROM to [facility] to allow her to place items in overhead  cabinets/shelves. Baseline:  right shoulder flexion 110 degrees on 11/04/23 Goal status:  NEW on 11/04/2023  PLAN:  PT FREQUENCY: 1-2x/week  PT DURATION: 8 weeks  PLANNED INTERVENTIONS: 97164- PT Re-evaluation, 97750- Physical Performance Testing, 97110-Therapeutic exercises, 97530- Therapeutic activity, 97112- Neuromuscular re-education, 97535- Self Care, 02859- Manual therapy, [phone]- Gait training, [phone]- Canalith repositioning, [id number]- Aquatic Therapy, [phone]- Electrical stimulation (unattended), [phone]- Electrical stimulation (manual), [id number]- Vasopneumatic device, [id number]- Traction (mechanical), [id number]- Ionotophoresis 4mg /ml Dexamethasone, 79439 (1-2 muscles), [id number] (3+ muscles)- Dry Needling, Patient/Family education, Balance training, Stair training, Taping, Joint mobilization, Joint manipulation, Spinal manipulation, Spinal mobilization, Vestibular training, Cryotherapy, and Moist heat.  PLAN FOR NEXT SESSION: Assess and progress HEP as indicated, strengthening, flexibility/ROM, balance      Kristeen Sar, PT 11/06/23 11:14 AM [facility] [address], [address] [place], [place] [address] Phone # [phone] Fax [phone]

## 2023-11-11 ENCOUNTER — Encounter: Payer: Self-pay | Admitting: Physical Therapy

## 2023-11-11 ENCOUNTER — Ambulatory Visit: Admitting: Physical Therapy

## 2023-11-11 DIAGNOSIS — R2681 Unsteadiness on feet: Secondary | ICD-10-CM

## 2023-11-11 DIAGNOSIS — M5459 Other low back pain: Secondary | ICD-10-CM

## 2023-11-11 DIAGNOSIS — M6281 Muscle weakness (generalized): Secondary | ICD-10-CM

## 2023-11-11 NOTE — Therapy (Signed)
 OUTPATIENT PHYSICAL THERAPY TREATMENT NOTE   Patient Name: Sabrina Castaneda MRN: [id number] DOB:1935/05/16, 88 y.o., female Today's Date: 11/11/2023     END OF SESSION:  PT End of Session - 11/11/23 1131     Visit Number 9    Date for Recertification  01/02/24    Authorization Type [employer]    Progress Note Due on Visit 17    PT Start Time 0932    PT Stop Time 1015    PT Time Calculation (min) 43 min    Activity Tolerance Patient tolerated treatment well    Behavior During Therapy [facility] for tasks assessed/performed                Past Medical History:  Diagnosis Date   Allergy    GERD (gastroesophageal reflux disease)    History of chicken pox    Hyperlipidemia    Scoliosis    Thyroid  disease    Past Surgical History:  Procedure Laterality Date   ABDOMINAL HYSTERECTOMY  1980   ADENOIDECTOMY     HERNIA REPAIR  02/04/1966   REPLACEMENT TOTAL KNEE Right    TONSILLECTOMY     WRIST FRACTURE SURGERY Bilateral    Patient Active Problem List   Diagnosis Date Noted   Acquired trigger finger of left middle finger 03/11/2023   Neck pain 12/09/2022   Cervical radiculopathy 11/18/2022   Scoliosis of thoracic spine 11/18/2022   Depression 03/14/2020   Overweight (BMI 25.0-29.9) 03/14/2020   Physical exam 09/10/2019   Hyperlipidemia 08/16/2019   Hypothyroid 08/16/2019    PCP:  Mahlon Comer BRAVO, MD  REFERRING PROVIDER: Laqueta Ozell BIRCH, MD  REFERRING DIAG: Joint Pain  Rationale for Evaluation and Treatment: Rehabilitation  THERAPY DIAG:  Other low back pain  Muscle weakness (generalized)  Unsteadiness on feet  ONSET DATE: May 2025  SUBJECTIVE:                                                                                                                                                                                           SUBJECTIVE STATEMENT: Patient reports she has had increased right shoulder pain since last session. She has a follow up  appointment scheduled on Friday with her provider. She has been sleeping with the pillow supporting her shoulder and that has helped some. She has pain with reaching activities.  PERTINENT HISTORY:  Scoliosis; Hx right knee replacement; hx cervical fusion, pt reports diplopia   PAIN:  Are you having pain? Yes: NPRS scale: 6-7/10 Pain location: right shoulder Pain description: stabbing pain that wouldn't go away Aggravating factors: walking across the street (she lives in  [place]) Relieving factors: Nothing  PRECAUTIONS: Fall  RED FLAGS: None   WEIGHT BEARING RESTRICTIONS: No  FALLS:  Has patient fallen in last 6 months? Yes. Number of falls 1 fall in September 2025   LIVING ENVIRONMENT: Lives with: lives alone in [place]  Lives in: House/apartment Stairs: No Has following equipment at home: Single point cane  OCCUPATION: Retired  PLOF: Independent, Independent with basic ADLs, Independent with household mobility without device, Independent with community mobility without device, Independent with gait, Independent with transfers, and Leisure: Playing cards, hanging out friends, events at [employer], reading  PATIENT GOALS: Find something to strengthen the muscles and relieve the pain  NEXT MD VISIT: PRN  OBJECTIVE:  Note: Objective measures were completed at Evaluation unless otherwise noted.  DIAGNOSTIC FINDINGS:  From 09/16/2023 MD Note: X-ray lumbar spine (08/25/2023): 5 lumbar-type vertebral bodies with severe levoconvex lumbar scoliosis. Grade 1 anterolisthesis of L3 on L4 and L4 on L5. Severe disc space narrowing at L4-5 and notable at L3-4 and L5-S1. Facet arthrosis noted throughout the lower lumbar spine.    MRI Cervical spine (01/13/2023): C2-3 moderate left and moderate-severe right foraminal stenosis. C3-4 moderate biforaminal stenosis. C4-5 with moderate left foraminal stenosis and C5-6 with moderate right foraminal stenosis.   PATIENT SURVEYS:  Eval:   Modified Oswestry: 9/50 18%  Minimally Clinically Important Difference (MCID) = 12.8%  11/04/2023: Quick DASH 52.27 Modified Oswestry Low Back Pain Disability Questionnaire: 12 / 50 = 24.0 %  COGNITION: Overall cognitive status: Within functional limits for tasks assessed     SENSATION: WFL  MUSCLE LENGTH: Hamstrings: WFL   POSTURE: rounded shoulders and forward head With scoliosis thoracic curve concave to the left  PALPATION: No tenderness in bilateral glutes or paraspinals  LUMBAR ROM: *pain  AROM eval  Flexion Can touch toes  Extension 90% limited *  Right lateral flexion Bends to knee joint  Left lateral flexion Bends to above knee joint *  Right rotation full  Left rotation 80% limited   (Blank rows = not tested)  LOWER EXTREMITY ROM:   Eval: PROM:   Limited hip IR bilateral ; unable to achieve FABER position on Rt   UPPER EXTREMITY A/ROM:  11/04/2022: Right shoulder flexion: 110 deg Right shoulder abduction:  140 deg  UPPER EXTREMITY MMT:  11/04/2023: Left shoulder strength 4+ to 5-/5 grossly throughout Right shoulder strength grossly 4/5 throughout   LOWER EXTREMITY MMT:    MMT Right eval Left eval  Hip flexion 4- 4-  Hip extension    Hip abduction 4- 4-  Hip adduction 4 4  Hip internal rotation    Hip external rotation    Knee flexion 4 4  Knee extension 4 4  Ankle dorsiflexion    Ankle plantarflexion    Ankle inversion    Ankle eversion     (Blank rows = not tested)   FUNCTIONAL TESTS:  Eval: 5 times sit to stand: 15.89 sec with UE support Timed up and go (TUG): 12.95 sec patient caught right foot behind left. Decreased speed of turn  10/09/2023: 6 minute walk test:  1000 ft with RPE of 5-6/10 and pain increasing to 5/10 (age related norms are 1,155 ft)  GAIT: Comments: decreased cadence; Wide BOS  TREATMENT DATE:  11/11/2023: Nustep level 5 x6 min with PT present to discuss status 3 way stability ball stretch x 8 each  direction Right shoulder abduction and scaption with blue stability ball x 10 Seated shoulder row with red TB 2  x 10 Standing shoulder row with red TB 2 x 10 6 in step taps holding 4# DBS x 20 Hurdles (lateral & forwards) unilateral 4# DB hold x 3 laps each Attempted pallof press with yellow TB but patient had increased right shoulder pain after 5 reps Standing shoulder flexion stretch at barre 6 x 10 sec hold    11/06/2023: Nustep level 5 x6 min with PT present to discuss status Patient education on sleeping position 3 way stability ball stretch x 10 each direction Supine shoulder flexion with dowel rod 2x10 Supine chest press with dowel rod x 8 (painful today) Supine serratus punch x 8 Rt  (painful) Ta activation + ball squeeze x 20 Farmer's carry 3# DB x 2 laps around cancer gym 8inch step taps holding bilateral 3# DB x 20 (caught foot on step once and required support from //bars) Tandem walking on airex pad (forwards & backwards) in // bars x 4 laps Side stepping on airex pad in // bars x 4 laps Sit to stand holding two 3# DB x 12 Standing shoulder row with red TB 2 x 10 Standing shoulder extension with red TB 2 x 10  11/04/2023: Nustep level 5 x6 min with PT present to discuss status Right shoulder assessment Seated green pball rollout x10 Standing shoulder flexion wall slide 2x10 right UE Supine shoulder flexion with dowel rod 2x10 Supine chest press with dowel rod 2x10 Supine lower trunk rotation x15 Quick DASH and Modified Oswestry    PATIENT EDUCATION:  Education details: Provided patient education and discussion on quality of life and stress management. Discussed motivators, challenges, and goals for future.  Person educated: Patient Education method: Explanation, Demonstration, and Handouts Education comprehension: verbalized understanding, returned demonstration, and needs further education  HOME EXERCISE PROGRAM: Access Code: [email or web address] URL:  [email or web address] Date: 11/04/2023 Prepared by: Jarrell Menke  Exercises - Supine Lower Trunk Rotation  - 1 x daily - 7 x weekly - 1 sets - 8 reps - 4s hold - Supine Alternating Knee Taps with Hands  - 1 x daily - 7 x weekly - 1 sets - 10 reps - 3s hold - Sit to Stand with Armchair  - 1 x daily - 7 x weekly - 1-2 sets - 10 reps - Seated Hip Abduction with Resistance  - 1 x daily - 7 x weekly - 2 sets - 10 reps - Seated March with Resistance  - 1 x daily - 7 x weekly - 2 sets - 10 reps - Standing Shoulder Row with Anchored Resistance  - 1 x daily - 7 x weekly - 2 sets - 10 reps - Seated Horizontal Smooth Pursuit  - 1 x daily - 7 x weekly - 2 sets - 10 reps - Seated Proximal-Distal Smooth Pursuit  - 1 x daily - 7 x weekly - 2 sets - 10 reps - Shoulder Flexion Wall Slide with Towel  - 1 x daily - 7 x weekly - 2 sets - 10 reps - Supine Shoulder Flexion with Dowel  - 1 x daily - 7 x weekly - 2 sets - 10 reps  ASSESSMENT:  CLINICAL IMPRESSION: Ms Janeway continues to verbalized having increased right shoulder pain levels with reaching activities. Treatment session focused on supported shoulder ROM, balance, and postural strengthening. With pallof press exercise patient had increased right shoulder pain after completing 5 repetitions, so discontinued exercise. Patient is highly motivated and wants to maintain shoulder function and decreased pain. Demonstrated exercises to work on shoulder  ROM, but with hands supported. Patient has a follow up appointment with MD on Friday. Patient will benefit from skilled PT to address the below impairments and improve overall function.     OBJECTIVE IMPAIRMENTS: Abnormal gait, decreased balance, decreased endurance, decreased mobility, difficulty walking, decreased ROM, decreased strength, hypomobility, increased muscle spasms, impaired flexibility, postural dysfunction, and pain.   ACTIVITY LIMITATIONS: carrying, lifting, bending, sitting,  standing, squatting, stairs, transfers, bed mobility, and locomotion level  PARTICIPATION LIMITATIONS: meal prep, cleaning, laundry, interpersonal relationship, driving, and community activity  PERSONAL FACTORS: Age, Fitness, Time since onset of injury/illness/exacerbation, and 1 comorbidity: Scoliosis are also affecting patient's functional outcome.   REHAB POTENTIAL: Good  CLINICAL DECISION MAKING: Evolving/moderate complexity  EVALUATION COMPLEXITY: Moderate   GOALS: Goals reviewed with patient? Yes  SHORT TERM GOALS: Target date: 10/24/2023 Patient will be independent with initial HEP. Baseline:  Goal status: Met on 10/09/23  2.  Patient will be able to participate in a to establish a baseline for functional test. Baseline:  Goal status: Met on 10/09/23 (1,000 ft with RPE of 5-6/10 and increased pain)   LONG TERM GOALS: Target date: 01/02/2024  Patient will demonstrate independence in advanced HEP. Baseline:  Goal status: Ongoing  2.  Patient will report > or = to 60% improvement in back and shoulder pain since starting PT. Baseline:  Goal status: Progressing   3.  Patient will verbalize and demonstrate self-care strategies to manage pain including tissue mobility practices and change of position. Baseline:  Goal status: Ongoing  4.  Patient will be able to walk across the street in her retirement facility with no back pain for improved functional mobility. Baseline:  Goal status: Progressing   5.  Patient will be able to carry at least 5# to help her ability to bring in groceries safely and without a loss of balance. Baseline:  Goal status: Progressing   6.  Patient will be able to ambulate at least 820ft with LRAD without a loss of balance to improve ability to ambulate community distances safely. Baseline:  Goal status: Ongoing  7.  Patient will be increase right shoulder A/ROM to [facility] to allow her to place items in overhead cabinets/shelves. Baseline:   right shoulder flexion 110 degrees on 11/04/23 Goal status:  NEW on 11/04/2023  PLAN:  PT FREQUENCY: 1-2x/week  PT DURATION: 8 weeks  PLANNED INTERVENTIONS: 97164- PT Re-evaluation, 97750- Physical Performance Testing, 97110-Therapeutic exercises, 97530- Therapeutic activity, 97112- Neuromuscular re-education, 97535- Self Care, [id number]- Manual therapy, [phone]- Gait training, [phone]- Canalith repositioning, [id number]- Aquatic Therapy, [phone]- Electrical stimulation (unattended), [phone]- Electrical stimulation (manual), [id number]- Vasopneumatic device, [id number]- Traction (mechanical), [id number]- Ionotophoresis 4mg /ml Dexamethasone, 79439 (1-2 muscles), [id number] (3+ muscles)- Dry Needling, Patient/Family education, Balance training, Stair training, Taping, Joint mobilization, Joint manipulation, Spinal manipulation, Spinal mobilization, Vestibular training, Cryotherapy, and Moist heat.  PLAN FOR NEXT SESSION: assess shoulder pain; continue gentle ROM and strengthening; strengthening, flexibility/ROM, balance      Kristeen Sar, PT 11/11/23 11:34 AM [facility] [address] [place], [place] [address] Phone # [phone] Fax [phone]

## 2023-11-13 ENCOUNTER — Encounter: Payer: Self-pay | Admitting: Physical Therapy

## 2023-11-13 ENCOUNTER — Ambulatory Visit: Admitting: Physical Therapy

## 2023-11-13 DIAGNOSIS — M5459 Other low back pain: Secondary | ICD-10-CM | POA: Diagnosis not present

## 2023-11-13 DIAGNOSIS — R2681 Unsteadiness on feet: Secondary | ICD-10-CM

## 2023-11-13 DIAGNOSIS — M6281 Muscle weakness (generalized): Secondary | ICD-10-CM

## 2023-11-13 NOTE — Therapy (Signed)
 OUTPATIENT PHYSICAL THERAPY TREATMENT NOTE   Patient Name: Sabrina Castaneda MRN: [id number] DOB:23-Oct-1935, 88 y.o., female Today's Date: 11/13/2023     END OF SESSION:  PT End of Session - 11/13/23 1032     Visit Number 10    Date for Recertification  01/02/24    Authorization Type [employer]    Progress Note Due on Visit 17    PT Start Time 0933    PT Stop Time 1018    PT Time Calculation (min) 45 min    Activity Tolerance Patient tolerated treatment well    Behavior During Therapy [facility] for tasks assessed/performed                 Past Medical History:  Diagnosis Date   Allergy    GERD (gastroesophageal reflux disease)    History of chicken pox    Hyperlipidemia    Scoliosis    Thyroid  disease    Past Surgical History:  Procedure Laterality Date   ABDOMINAL HYSTERECTOMY  1980   ADENOIDECTOMY     HERNIA REPAIR  02/04/1966   REPLACEMENT TOTAL KNEE Right    TONSILLECTOMY     WRIST FRACTURE SURGERY Bilateral    Patient Active Problem List   Diagnosis Date Noted   Acquired trigger finger of left middle finger 03/11/2023   Neck pain 12/09/2022   Cervical radiculopathy 11/18/2022   Scoliosis of thoracic spine 11/18/2022   Depression 03/14/2020   Overweight (BMI 25.0-29.9) 03/14/2020   Physical exam 09/10/2019   Hyperlipidemia 08/16/2019   Hypothyroid 08/16/2019    PCP:  Mahlon Comer BRAVO, MD  REFERRING PROVIDER: Laqueta Ozell BIRCH, MD  REFERRING DIAG: Joint Pain  Rationale for Evaluation and Treatment: Rehabilitation  THERAPY DIAG:  Other low back pain  Muscle weakness (generalized)  Unsteadiness on feet  ONSET DATE: May 2025  SUBJECTIVE:                                                                                                                                                                                           SUBJECTIVE STATEMENT: Patient reports she is doing good today. She felt good after last treatment  session.  PERTINENT HISTORY:  Scoliosis; Hx right knee replacement; hx cervical fusion, pt reports diplopia   PAIN:  Are you having pain? Yes: NPRS scale: 6-7/10 Pain location: right shoulder Pain description: stabbing pain that wouldn't go away Aggravating factors: walking across the street (she lives in [place]) Relieving factors: Nothing  PRECAUTIONS: Fall  RED FLAGS: None   WEIGHT BEARING RESTRICTIONS: No  FALLS:  Has patient fallen in last 6 months? Yes. Number of  falls 1 fall in September 2025   LIVING ENVIRONMENT: Lives with: lives alone in [place]  Lives in: House/apartment Stairs: No Has following equipment at home: Single point cane  OCCUPATION: Retired  PLOF: Independent, Independent with basic ADLs, Independent with household mobility without device, Independent with community mobility without device, Independent with gait, Independent with transfers, and Leisure: Playing cards, hanging out friends, events at [employer], reading  PATIENT GOALS: Find something to strengthen the muscles and relieve the pain  NEXT MD VISIT: PRN  OBJECTIVE:  Note: Objective measures were completed at Evaluation unless otherwise noted.  DIAGNOSTIC FINDINGS:  From 09/16/2023 MD Note: X-ray lumbar spine (08/25/2023): 5 lumbar-type vertebral bodies with severe levoconvex lumbar scoliosis. Grade 1 anterolisthesis of L3 on L4 and L4 on L5. Severe disc space narrowing at L4-5 and notable at L3-4 and L5-S1. Facet arthrosis noted throughout the lower lumbar spine.    MRI Cervical spine (01/13/2023): C2-3 moderate left and moderate-severe right foraminal stenosis. C3-4 moderate biforaminal stenosis. C4-5 with moderate left foraminal stenosis and C5-6 with moderate right foraminal stenosis.   PATIENT SURVEYS:  Eval:  Modified Oswestry: 9/50 18%  Minimally Clinically Important Difference (MCID) = 12.8%  11/04/2023: Quick DASH 52.27 Modified Oswestry Low Back Pain Disability  Questionnaire: 12 / 50 = 24.0 %  COGNITION: Overall cognitive status: Within functional limits for tasks assessed     SENSATION: WFL  MUSCLE LENGTH: Hamstrings: WFL   POSTURE: rounded shoulders and forward head With scoliosis thoracic curve concave to the left  PALPATION: No tenderness in bilateral glutes or paraspinals  LUMBAR ROM: *pain  AROM eval  Flexion Can touch toes  Extension 90% limited *  Right lateral flexion Bends to knee joint  Left lateral flexion Bends to above knee joint *  Right rotation full  Left rotation 80% limited   (Blank rows = not tested)  LOWER EXTREMITY ROM:   Eval: PROM:   Limited hip IR bilateral ; unable to achieve FABER position on Rt   UPPER EXTREMITY A/ROM:  11/04/2022: Right shoulder flexion: 110 deg Right shoulder abduction:  140 deg  UPPER EXTREMITY MMT:  11/04/2023: Left shoulder strength 4+ to 5-/5 grossly throughout Right shoulder strength grossly 4/5 throughout   LOWER EXTREMITY MMT:    MMT Right eval Left eval  Hip flexion 4- 4-  Hip extension    Hip abduction 4- 4-  Hip adduction 4 4  Hip internal rotation    Hip external rotation    Knee flexion 4 4  Knee extension 4 4  Ankle dorsiflexion    Ankle plantarflexion    Ankle inversion    Ankle eversion     (Blank rows = not tested)   FUNCTIONAL TESTS:  Eval: 5 times sit to stand: 15.89 sec with UE support Timed up and go (TUG): 12.95 sec patient caught right foot behind left. Decreased speed of turn  10/09/2023: 6 minute walk test:  1000 ft with RPE of 5-6/10 and pain increasing to 5/10 (age related norms are 1,155 ft)  GAIT: Comments: decreased cadence; Wide BOS  TREATMENT DATE:  11/13/2023: Nustep level 5 x7 min with PT present to discuss status 3 way stability ball stretch x 8 each direction Right shoulder abduction and scaption with blue stability ball x 10 Shoulder pulleys (abduction & flexion) x 2 mins each Discussion of things to ask MD tomorrow  about possible injections; supported shoulder ROM exercises; ways to continue physical activity Single leg on airex + opposite leg swing (forwards,  sideways, backwards) x 10 each Sit to stand holding 5# DB x 10 Airex step up + march with 5# unilateral hold x 12 bilateral  Farmer's carry holding 5# Dbs x 1 lap around both gyms     11/11/2023: Nustep level 5 x6 min with PT present to discuss status 3 way stability ball stretch x 8 each direction Right shoulder abduction and scaption with blue stability ball x 10 Seated shoulder row with red TB 2 x 10 Standing shoulder row with red TB 2 x 10 6 in step taps holding 4# DBS x 20 Hurdles (lateral & forwards) unilateral 4# DB hold x 3 laps each Attempted pallof press with yellow TB but patient had increased right shoulder pain after 5 reps Standing shoulder flexion stretch at barre 6 x 10 sec hold    11/06/2023: Nustep level 5 x6 min with PT present to discuss status Patient education on sleeping position 3 way stability ball stretch x 10 each direction Supine shoulder flexion with dowel rod 2x10 Supine chest press with dowel rod x 8 (painful today) Supine serratus punch x 8 Rt  (painful) Ta activation + ball squeeze x 20 Farmer's carry 3# DB x 2 laps around cancer gym 8inch step taps holding bilateral 3# DB x 20 (caught foot on step once and required support from //bars) Tandem walking on airex pad (forwards & backwards) in // bars x 4 laps Side stepping on airex pad in // bars x 4 laps Sit to stand holding two 3# DB x 12 Standing shoulder row with red TB 2 x 10 Standing shoulder extension with red TB 2 x 10  11/04/2023: Nustep level 5 x6 min with PT present to discuss status Right shoulder assessment Seated green pball rollout x10 Standing shoulder flexion wall slide 2x10 right UE Supine shoulder flexion with dowel rod 2x10 Supine chest press with dowel rod 2x10 Supine lower trunk rotation x15 Quick DASH and Modified  Oswestry    PATIENT EDUCATION:  Education details: Provided patient education and discussion on quality of life and stress management. Discussed motivators, challenges, and goals for future.  Person educated: Patient Education method: Explanation, Demonstration, and Handouts Education comprehension: verbalized understanding, returned demonstration, and needs further education  HOME EXERCISE PROGRAM: Access Code: [email or web address] URL: [email or web address] Date: 11/04/2023 Prepared by: Jarrell Menke  Exercises - Supine Lower Trunk Rotation  - 1 x daily - 7 x weekly - 1 sets - 8 reps - 4s hold - Supine Alternating Knee Taps with Hands  - 1 x daily - 7 x weekly - 1 sets - 10 reps - 3s hold - Sit to Stand with Armchair  - 1 x daily - 7 x weekly - 1-2 sets - 10 reps - Seated Hip Abduction with Resistance  - 1 x daily - 7 x weekly - 2 sets - 10 reps - Seated March with Resistance  - 1 x daily - 7 x weekly - 2 sets - 10 reps - Standing Shoulder Row with Anchored Resistance  - 1 x daily - 7 x weekly - 2 sets - 10 reps - Seated Horizontal Smooth Pursuit  - 1 x daily - 7 x weekly - 2 sets - 10 reps - Seated Proximal-Distal Smooth Pursuit  - 1 x daily - 7 x weekly - 2 sets - 10 reps - Shoulder Flexion Wall Slide with Towel  - 1 x daily - 7 x weekly - 2 sets - 10 reps - Supine Shoulder Flexion with Dowel  -  1 x daily - 7 x weekly - 2 sets - 10 reps  ASSESSMENT:  CLINICAL IMPRESSION: Ms. Bark presents with no current right shoulder pain or discomfort. She feels she is trending in the right direction now as far as managing her shoulder pain. Treatment session focused on shoulder ROM, periscapular strengthening, and balance. She has an appointment scheduled tomorrow with her MD about her shoulder pain. Educated patient on ways to improve shoulder ROM at home to maintain function. PT monitored patient throughout and provided visual cues for correct exercise performance. Patient will benefit  from skilled PT to address the below impairments and improve overall function.      OBJECTIVE IMPAIRMENTS: Abnormal gait, decreased balance, decreased endurance, decreased mobility, difficulty walking, decreased ROM, decreased strength, hypomobility, increased muscle spasms, impaired flexibility, postural dysfunction, and pain.   ACTIVITY LIMITATIONS: carrying, lifting, bending, sitting, standing, squatting, stairs, transfers, bed mobility, and locomotion level  PARTICIPATION LIMITATIONS: meal prep, cleaning, laundry, interpersonal relationship, driving, and community activity  PERSONAL FACTORS: Age, Fitness, Time since onset of injury/illness/exacerbation, and 1 comorbidity: Scoliosis are also affecting patient's functional outcome.   REHAB POTENTIAL: Good  CLINICAL DECISION MAKING: Evolving/moderate complexity  EVALUATION COMPLEXITY: Moderate   GOALS: Goals reviewed with patient? Yes  SHORT TERM GOALS: Target date: 10/24/2023 Patient will be independent with initial HEP. Baseline:  Goal status: Met on 10/09/23  2.  Patient will be able to participate in a to establish a baseline for functional test. Baseline:  Goal status: Met on 10/09/23 (1,000 ft with RPE of 5-6/10 and increased pain)   LONG TERM GOALS: Target date: 01/02/2024  Patient will demonstrate independence in advanced HEP. Baseline:  Goal status: Ongoing  2.  Patient will report > or = to 60% improvement in back and shoulder pain since starting PT. Baseline:  Goal status: Progressing   3.  Patient will verbalize and demonstrate self-care strategies to manage pain including tissue mobility practices and change of position. Baseline:  Goal status: Ongoing  4.  Patient will be able to walk across the street in her retirement facility with no back pain for improved functional mobility. Baseline:  Goal status: Progressing   5.  Patient will be able to carry at least 5# to help her ability to bring in  groceries safely and without a loss of balance. Baseline:  Goal status: Progressing   6.  Patient will be able to ambulate at least 842ft with LRAD without a loss of balance to improve ability to ambulate community distances safely. Baseline:  Goal status: Ongoing  7.  Patient will be increase right shoulder A/ROM to [facility] to allow her to place items in overhead cabinets/shelves. Baseline:  right shoulder flexion 110 degrees on 11/04/23 Goal status:  NEW on 11/04/2023  PLAN:  PT FREQUENCY: 1-2x/week  PT DURATION: 8 weeks  PLANNED INTERVENTIONS: 97164- PT Re-evaluation, 97750- Physical Performance Testing, 97110-Therapeutic exercises, 97530- Therapeutic activity, 97112- Neuromuscular re-education, 97535- Self Care, [id number]- Manual therapy, [phone]- Gait training, [phone]- Canalith repositioning, [id number]- Aquatic Therapy, [phone]- Electrical stimulation (unattended), [phone]- Electrical stimulation (manual), [id number]- Vasopneumatic device, [id number]- Traction (mechanical), [id number]- Ionotophoresis 4mg /ml Dexamethasone, 79439 (1-2 muscles), [id number] (3+ muscles)- Dry Needling, Patient/Family education, Balance training, Stair training, Taping, Joint mobilization, Joint manipulation, Spinal manipulation, Spinal mobilization, Vestibular training, Cryotherapy, and Moist heat.  PLAN FOR NEXT SESSION: continue gentle ROM and strengthening; strengthening, flexibility/ROM, balance   Kristeen Sar, PT 11/13/23 10:34 AM [facility] [address] [place], [place] [address] Phone #  [phone] Fax [phone]

## 2023-11-18 ENCOUNTER — Ambulatory Visit: Admitting: Rehabilitative and Restorative Service Providers"

## 2023-11-18 ENCOUNTER — Encounter: Payer: Self-pay | Admitting: Rehabilitative and Restorative Service Providers"

## 2023-11-18 DIAGNOSIS — M5459 Other low back pain: Secondary | ICD-10-CM

## 2023-11-18 DIAGNOSIS — M6281 Muscle weakness (generalized): Secondary | ICD-10-CM

## 2023-11-18 DIAGNOSIS — R2681 Unsteadiness on feet: Secondary | ICD-10-CM

## 2023-11-18 NOTE — Therapy (Signed)
 OUTPATIENT PHYSICAL THERAPY TREATMENT NOTE   Patient Name: Sabrina Castaneda MRN: [id number] DOB:[birth year], 88 y.o., female Today's Date: 11/18/2023     END OF SESSION:  PT End of Session - 11/18/23 0937     Visit Number 11    Date for Recertification  01/02/24    Authorization Type [employer]    Progress Note Due on Visit 17    PT Start Time 0932    PT Stop Time 1010    PT Time Calculation (min) 38 min    Activity Tolerance Patient tolerated treatment well    Behavior During Therapy [facility] for tasks assessed/performed                 Past Medical History:  Diagnosis Date   Allergy    GERD (gastroesophageal reflux disease)    History of chicken pox    Hyperlipidemia    Scoliosis    Thyroid  disease    Past Surgical History:  Procedure Laterality Date   ABDOMINAL HYSTERECTOMY  1980   ADENOIDECTOMY     HERNIA REPAIR  02/04/1966   REPLACEMENT TOTAL KNEE Right    TONSILLECTOMY     WRIST FRACTURE SURGERY Bilateral    Patient Active Problem List   Diagnosis Date Noted   Acquired trigger finger of left middle finger 03/11/2023   Neck pain 12/09/2022   Cervical radiculopathy 11/18/2022   Scoliosis of thoracic spine 11/18/2022   Depression 03/14/2020   Overweight (BMI 25.0-29.9) 03/14/2020   Physical exam 09/10/2019   Hyperlipidemia 08/16/2019   Hypothyroid 08/16/2019    PCP:  Mahlon Comer BRAVO, MD  REFERRING PROVIDER: Laqueta Ozell BIRCH, MD  REFERRING DIAG: Joint Pain  Rationale for Evaluation and Treatment: Rehabilitation  THERAPY DIAG:  Other low back pain  Muscle weakness (generalized)  Unsteadiness on feet  ONSET DATE: May 2025  SUBJECTIVE:                                                                                                                                                                                           SUBJECTIVE STATEMENT: Patient reports that her shoulder isn't currently hurting, but when it hurts, it's a 7/10.  She  reports that her MD is going to order a MRI of her shoulder to rule out a tear.  PERTINENT HISTORY:  Scoliosis; Hx right knee replacement; hx cervical fusion, pt reports diplopia   PAIN:  Are you having pain? Yes: NPRS scale: 0-7/10 Pain location: right shoulder Pain description: stabbing pain that wouldn't go away Aggravating factors: walking across the street (she lives in [place]) Relieving factors: Nothing  PRECAUTIONS: Fall  RED  FLAGS: None   WEIGHT BEARING RESTRICTIONS: No  FALLS:  Has patient fallen in last 6 months? Yes. Number of falls 1 fall in September 2025   LIVING ENVIRONMENT: Lives with: lives alone in [place]  Lives in: House/apartment Stairs: No Has following equipment at home: Single point cane  OCCUPATION: Retired  PLOF: Independent, Independent with basic ADLs, Independent with household mobility without device, Independent with community mobility without device, Independent with gait, Independent with transfers, and Leisure: Playing cards, hanging out friends, events at [employer], reading  PATIENT GOALS: Find something to strengthen the muscles and relieve the pain  NEXT MD VISIT: PRN  OBJECTIVE:  Note: Objective measures were completed at Evaluation unless otherwise noted.  DIAGNOSTIC FINDINGS:  From 09/16/2023 MD Note: X-ray lumbar spine (08/25/2023): 5 lumbar-type vertebral bodies with severe levoconvex lumbar scoliosis. Grade 1 anterolisthesis of L3 on L4 and L4 on L5. Severe disc space narrowing at L4-5 and notable at L3-4 and L5-S1. Facet arthrosis noted throughout the lower lumbar spine.    MRI Cervical spine (01/13/2023): C2-3 moderate left and moderate-severe right foraminal stenosis. C3-4 moderate biforaminal stenosis. C4-5 with moderate left foraminal stenosis and C5-6 with moderate right foraminal stenosis.   PATIENT SURVEYS:  Eval:  Modified Oswestry: 9/50 18%  Minimally Clinically Important Difference (MCID) =  12.8%  11/04/2023: Quick DASH 52.27 Modified Oswestry Low Back Pain Disability Questionnaire: 12 / 50 = 24.0 %  COGNITION: Overall cognitive status: Within functional limits for tasks assessed     SENSATION: WFL  MUSCLE LENGTH: Hamstrings: WFL   POSTURE: rounded shoulders and forward head With scoliosis thoracic curve concave to the left  PALPATION: No tenderness in bilateral glutes or paraspinals  LUMBAR ROM: *pain  AROM eval  Flexion Can touch toes  Extension 90% limited *  Right lateral flexion Bends to knee joint  Left lateral flexion Bends to above knee joint *  Right rotation full  Left rotation 80% limited   (Blank rows = not tested)  LOWER EXTREMITY ROM:   Eval: PROM:   Limited hip IR bilateral ; unable to achieve FABER position on Rt   UPPER EXTREMITY A/ROM:  11/04/2022: Right shoulder flexion: 110 deg Right shoulder abduction:  140 deg  UPPER EXTREMITY MMT:  11/04/2023: Left shoulder strength 4+ to 5-/5 grossly throughout Right shoulder strength grossly 4/5 throughout   LOWER EXTREMITY MMT:    MMT Right eval Left eval  Hip flexion 4- 4-  Hip extension    Hip abduction 4- 4-  Hip adduction 4 4  Hip internal rotation    Hip external rotation    Knee flexion 4 4  Knee extension 4 4  Ankle dorsiflexion    Ankle plantarflexion    Ankle inversion    Ankle eversion     (Blank rows = not tested)   FUNCTIONAL TESTS:  Eval: 5 times sit to stand: 15.89 sec with UE support Timed up and go (TUG): 12.95 sec patient caught right foot behind left. Decreased speed of turn  10/09/2023: 6 minute walk test:  1000 ft with RPE of 5-6/10 and pain increasing to 5/10 (age related norms are 1,155 ft)  GAIT: Comments: decreased cadence; Wide BOS  TREATMENT DATE:   11/18/2023: Nustep level 5 x6 min with PT present to discuss status 3 way stability ball stretch x 8 each direction Right shoulder abduction and scaption with blue stability ball x 10 Shoulder  pulleys (abduction & flexion) x 2 mins each Standing shoulder rows with red tband  2x10 Standing shoulder press with red tband 2x10 Sit to/from stand holding 5# kettlebell x10 Farmer's carry holding 5# kettlebell x 1 lap around both gyms   11/13/2023: Nustep level 5 x7 min with PT present to discuss status 3 way stability ball stretch x 8 each direction Right shoulder abduction and scaption with blue stability ball x 10 Shoulder pulleys (abduction & flexion) x 2 mins each Discussion of things to ask MD tomorrow about possible injections; supported shoulder ROM exercises; ways to continue physical activity Single leg on airex + opposite leg swing (forwards, sideways, backwards) x 10 each Sit to stand holding 5# DB x 10 Airex step up + march with 5# unilateral hold x 12 bilateral  Farmer's carry holding 5# Dbs x 1 lap around both gyms     11/11/2023: Nustep level 5 x6 min with PT present to discuss status 3 way stability ball stretch x 8 each direction Right shoulder abduction and scaption with blue stability ball x 10 Seated shoulder row with red TB 2 x 10 Standing shoulder row with red TB 2 x 10 6 in step taps holding 4# DBS x 20 Hurdles (lateral & forwards) unilateral 4# DB hold x 3 laps each Attempted pallof press with yellow TB but patient had increased right shoulder pain after 5 reps Standing shoulder flexion stretch at barre 6 x 10 sec hold     PATIENT EDUCATION:  Education details: Provided patient education and discussion on quality of life and stress management. Discussed motivators, challenges, and goals for future.  Person educated: Patient Education method: Explanation, Demonstration, and Handouts Education comprehension: verbalized understanding, returned demonstration, and needs further education  HOME EXERCISE PROGRAM: Access Code: [email or web address] URL: [email or web address] Date: 11/04/2023 Prepared by: Jarrell Joli Koob  Exercises - Supine Lower Trunk  Rotation  - 1 x daily - 7 x weekly - 1 sets - 8 reps - 4s hold - Supine Alternating Knee Taps with Hands  - 1 x daily - 7 x weekly - 1 sets - 10 reps - 3s hold - Sit to Stand with Armchair  - 1 x daily - 7 x weekly - 1-2 sets - 10 reps - Seated Hip Abduction with Resistance  - 1 x daily - 7 x weekly - 2 sets - 10 reps - Seated March with Resistance  - 1 x daily - 7 x weekly - 2 sets - 10 reps - Standing Shoulder Row with Anchored Resistance  - 1 x daily - 7 x weekly - 2 sets - 10 reps - Seated Horizontal Smooth Pursuit  - 1 x daily - 7 x weekly - 2 sets - 10 reps - Seated Proximal-Distal Smooth Pursuit  - 1 x daily - 7 x weekly - 2 sets - 10 reps - Shoulder Flexion Wall Slide with Towel  - 1 x daily - 7 x weekly - 2 sets - 10 reps - Supine Shoulder Flexion with Dowel  - 1 x daily - 7 x weekly - 2 sets - 10 reps  ASSESSMENT:  CLINICAL IMPRESSION:  Ms Hickok presents to skilled PT reporting that her MD is going to order a MRI of her shoulder secondary to the increased pain and will discuss further treatment options.  Patient with most pain during session with shoulder abduction pulleys.  Patient continues to progress with strengthening during session and working within available range.  Patient states that she has been doing the wall slides at home to help with her shoulder ROM.  Patient's shoulder  MRI has not been scheduled yet, but she hopes that it will be soon.  Patient continues to require skilled PT to progress towards goal related activities.    OBJECTIVE IMPAIRMENTS: Abnormal gait, decreased balance, decreased endurance, decreased mobility, difficulty walking, decreased ROM, decreased strength, hypomobility, increased muscle spasms, impaired flexibility, postural dysfunction, and pain.   ACTIVITY LIMITATIONS: carrying, lifting, bending, sitting, standing, squatting, stairs, transfers, bed mobility, and locomotion level  PARTICIPATION LIMITATIONS: meal prep, cleaning, laundry,  interpersonal relationship, driving, and community activity  PERSONAL FACTORS: Age, Fitness, Time since onset of injury/illness/exacerbation, and 1 comorbidity: Scoliosis are also affecting patient's functional outcome.   REHAB POTENTIAL: Good  CLINICAL DECISION MAKING: Evolving/moderate complexity  EVALUATION COMPLEXITY: Moderate   GOALS: Goals reviewed with patient? Yes  SHORT TERM GOALS: Target date: 10/24/2023 Patient will be independent with initial HEP. Baseline:  Goal status: Met on 10/09/23  2.  Patient will be able to participate in a to establish a baseline for functional test. Baseline:  Goal status: Met on 10/09/23 (1,000 ft with RPE of 5-6/10 and increased pain)   LONG TERM GOALS: Target date: 01/02/2024  Patient will demonstrate independence in advanced HEP. Baseline:  Goal status: Ongoing  2.  Patient will report > or = to 60% improvement in back and shoulder pain since starting PT. Baseline:  Goal status: Progressing   3.  Patient will verbalize and demonstrate self-care strategies to manage pain including tissue mobility practices and change of position. Baseline:  Goal status: Ongoing  4.  Patient will be able to walk across the street in her retirement facility with no back pain for improved functional mobility. Baseline:  Goal status: Progressing   5.  Patient will be able to carry at least 5# to help her ability to bring in groceries safely and without a loss of balance. Baseline:  Goal status: Progressing   6.  Patient will be able to ambulate at least 8103ft with LRAD without a loss of balance to improve ability to ambulate community distances safely. Baseline:  Goal status: Ongoing  7.  Patient will be increase right shoulder A/ROM to [facility] to allow her to place items in overhead cabinets/shelves. Baseline:  right shoulder flexion 110 degrees on 11/04/23 Goal status:  Ongoing  PLAN:  PT FREQUENCY: 1-2x/week  PT DURATION: 8 weeks  PLANNED  INTERVENTIONS: 97164- PT Re-evaluation, 97750- Physical Performance Testing, 97110-Therapeutic exercises, 97530- Therapeutic activity, 97112- Neuromuscular re-education, 97535- Self Care, 02859- Manual therapy, [phone]- Gait training, [phone]- Canalith repositioning, [id number]- Aquatic Therapy, [phone]- Electrical stimulation (unattended), [phone]- Electrical stimulation (manual), [id number]- Vasopneumatic device, [id number]- Traction (mechanical), [id number]- Ionotophoresis 4mg /ml Dexamethasone, 79439 (1-2 muscles), [id number] (3+ muscles)- Dry Needling, Patient/Family education, Balance training, Stair training, Taping, Joint mobilization, Joint manipulation, Spinal manipulation, Spinal mobilization, Vestibular training, Cryotherapy, and Moist heat.  PLAN FOR NEXT SESSION: continue gentle ROM and strengthening; strengthening, flexibility/ROM, balance   Jarrell Laming, PT, DPT 11/18/23, 11:29 AM  [facility] [address] [place], [place] [address] Phone # [phone] Fax [phone]

## 2023-11-19 ENCOUNTER — Other Ambulatory Visit

## 2023-11-20 ENCOUNTER — Encounter: Payer: Self-pay | Admitting: Family Medicine

## 2023-11-20 ENCOUNTER — Encounter: Payer: Self-pay | Admitting: Physical Therapy

## 2023-11-20 ENCOUNTER — Ambulatory Visit (INDEPENDENT_AMBULATORY_CARE_PROVIDER_SITE_OTHER): Admitting: Family Medicine

## 2023-11-20 ENCOUNTER — Ambulatory Visit: Admitting: Physical Therapy

## 2023-11-20 VITALS — BP 132/72 | HR 89 | Temp 97.3°F | Ht 62.5 in | Wt 152.1 lb

## 2023-11-20 DIAGNOSIS — R5383 Other fatigue: Secondary | ICD-10-CM

## 2023-11-20 DIAGNOSIS — M5459 Other low back pain: Secondary | ICD-10-CM | POA: Diagnosis not present

## 2023-11-20 DIAGNOSIS — R2681 Unsteadiness on feet: Secondary | ICD-10-CM

## 2023-11-20 DIAGNOSIS — R0602 Shortness of breath: Secondary | ICD-10-CM

## 2023-11-20 DIAGNOSIS — E039 Hypothyroidism, unspecified: Secondary | ICD-10-CM | POA: Diagnosis not present

## 2023-11-20 DIAGNOSIS — M6281 Muscle weakness (generalized): Secondary | ICD-10-CM

## 2023-11-20 DIAGNOSIS — E785 Hyperlipidemia, unspecified: Secondary | ICD-10-CM

## 2023-11-20 LAB — HEPATIC FUNCTION PANEL
ALT: 14 U/L (ref 0–35)
AST: 15 U/L (ref 0–37)
Albumin: 4.4 g/dL (ref 3.5–5.2)
Alkaline Phosphatase: 61 U/L (ref 39–117)
Bilirubin, Direct: 0.1 mg/dL (ref 0.0–0.3)
Total Bilirubin: 0.7 mg/dL (ref 0.2–1.2)
Total Protein: 6.8 g/dL (ref 6.0–8.3)

## 2023-11-20 LAB — CBC WITH DIFFERENTIAL/PLATELET
Basophils Absolute: 0.1 K/uL (ref 0.0–0.1)
Basophils Relative: 1.1 % (ref 0.0–3.0)
Eosinophils Absolute: 0.1 K/uL (ref 0.0–0.7)
Eosinophils Relative: 1.3 % (ref 0.0–5.0)
HCT: 41 % (ref 36.0–46.0)
Hemoglobin: 13.6 g/dL (ref 12.0–15.0)
Lymphocytes Relative: 23.3 % (ref 12.0–46.0)
Lymphs Abs: 1.7 K/uL (ref 0.7–4.0)
MCHC: 33.1 g/dL (ref 30.0–36.0)
MCV: 85.5 fl (ref 78.0–100.0)
Monocytes Absolute: 0.6 K/uL (ref 0.1–1.0)
Monocytes Relative: 8.5 % (ref 3.0–12.0)
Neutro Abs: 4.7 K/uL (ref 1.4–7.7)
Neutrophils Relative %: 65.8 % (ref 43.0–77.0)
Platelets: 383 K/uL (ref 150.0–400.0)
RBC: 4.8 Mil/uL (ref 3.87–5.11)
RDW: 13.7 % (ref 11.5–15.5)
WBC: 7.1 K/uL (ref 4.0–10.5)

## 2023-11-20 LAB — LIPID PANEL
Cholesterol: 166 mg/dL (ref 0–200)
HDL: 60.8 mg/dL (ref >39.00)
LDL Cholesterol: 77 mg/dL (ref 0–99)
NonHDL: 105.43
Total CHOL/HDL Ratio: 3
Triglycerides: 140 mg/dL (ref 0.0–149.0)
VLDL: 28 mg/dL (ref 0.0–40.0)

## 2023-11-20 LAB — TSH: TSH: 0.48 u[IU]/mL (ref 0.35–5.50)

## 2023-11-20 LAB — B12 AND FOLATE PANEL
Folate: 12.1 ng/mL (ref >5.9)
Vitamin B-12: 266 pg/mL (ref 211–911)

## 2023-11-20 LAB — BASIC METABOLIC PANEL WITH GFR
BUN: 15 mg/dL (ref 6–23)
CO2: 27 meq/L (ref 19–32)
Calcium: 9.5 mg/dL (ref 8.4–10.5)
Chloride: 99 meq/L (ref 96–112)
Creatinine, Ser: 0.63 mg/dL (ref 0.40–1.20)
GFR: 80.52 mL/min (ref >60.00)
Glucose, Bld: 101 mg/dL — ABNORMAL HIGH (ref 70–99)
Potassium: 4.2 meq/L (ref 3.5–5.1)
Sodium: 133 meq/L — ABNORMAL LOW (ref 135–145)

## 2023-11-20 LAB — VITAMIN D 25 HYDROXY (VIT D DEFICIENCY, FRACTURES): VITD: 37.73 ng/mL (ref 30.00–100.00)

## 2023-11-20 NOTE — Therapy (Signed)
 OUTPATIENT PHYSICAL THERAPY TREATMENT NOTE   Patient Name: Sabrina Castaneda MRN: [id number] DOB:24-Apr-1935, 88 y.o., female Today's Date: 11/20/2023     END OF SESSION:  PT End of Session - 11/20/23 1012     Visit Number 12    Date for Recertification  01/02/24    Authorization Type [employer]    Progress Note Due on Visit 17    PT Start Time 0926    PT Stop Time 1011    PT Time Calculation (min) 45 min    Activity Tolerance Patient tolerated treatment well    Behavior During Therapy [facility] for tasks assessed/performed                  Past Medical History:  Diagnosis Date   Allergy    GERD (gastroesophageal reflux disease)    History of chicken pox    Hyperlipidemia    Scoliosis    Thyroid  disease    Past Surgical History:  Procedure Laterality Date   ABDOMINAL HYSTERECTOMY  1980   ADENOIDECTOMY     HERNIA REPAIR  02/04/1966   REPLACEMENT TOTAL KNEE Right    TONSILLECTOMY     WRIST FRACTURE SURGERY Bilateral    Patient Active Problem List   Diagnosis Date Noted   Acquired trigger finger of left middle finger 03/11/2023   Neck pain 12/09/2022   Cervical radiculopathy 11/18/2022   Scoliosis of thoracic spine 11/18/2022   Depression 03/14/2020   Overweight (BMI 25.0-29.9) 03/14/2020   Physical exam 09/10/2019   Hyperlipidemia 08/16/2019   Hypothyroid 08/16/2019    PCP:  Mahlon Comer BRAVO, MD  REFERRING PROVIDER: Laqueta Ozell BIRCH, MD  REFERRING DIAG: Joint Pain  Rationale for Evaluation and Treatment: Rehabilitation  THERAPY DIAG:  Other low back pain  Muscle weakness (generalized)  Unsteadiness on feet  ONSET DATE: May 2025  SUBJECTIVE:                                                                                                                                                                                           SUBJECTIVE STATEMENT: Patient reports she is doing okay today. Her shoulder pain is still increased with activity  and when laying down.   PERTINENT HISTORY:  Scoliosis; Hx right knee replacement; hx cervical fusion, pt reports diplopia   PAIN:  Are you having pain? Yes: NPRS scale: 0-7/10 Pain location: right shoulder Pain description: stabbing pain that wouldn't go away Aggravating factors: walking across the street (she lives in [place]) Relieving factors: Nothing  PRECAUTIONS: Fall  RED FLAGS: None   WEIGHT BEARING RESTRICTIONS: No  FALLS:  Has patient fallen  in last 6 months? Yes. Number of falls 1 fall in September 2025   LIVING ENVIRONMENT: Lives with: lives alone in [place]  Lives in: House/apartment Stairs: No Has following equipment at home: Single point cane  OCCUPATION: Retired  PLOF: Independent, Independent with basic ADLs, Independent with household mobility without device, Independent with community mobility without device, Independent with gait, Independent with transfers, and Leisure: Playing cards, hanging out friends, events at [employer], reading  PATIENT GOALS: Find something to strengthen the muscles and relieve the pain  NEXT MD VISIT: PRN  OBJECTIVE:  Note: Objective measures were completed at Evaluation unless otherwise noted.  DIAGNOSTIC FINDINGS:  From 09/16/2023 MD Note: X-ray lumbar spine (08/25/2023): 5 lumbar-type vertebral bodies with severe levoconvex lumbar scoliosis. Grade 1 anterolisthesis of L3 on L4 and L4 on L5. Severe disc space narrowing at L4-5 and notable at L3-4 and L5-S1. Facet arthrosis noted throughout the lower lumbar spine.    MRI Cervical spine (01/13/2023): C2-3 moderate left and moderate-severe right foraminal stenosis. C3-4 moderate biforaminal stenosis. C4-5 with moderate left foraminal stenosis and C5-6 with moderate right foraminal stenosis.   PATIENT SURVEYS:  Eval:  Modified Oswestry: 9/50 18%  Minimally Clinically Important Difference (MCID) = 12.8%  11/04/2023: Quick DASH 52.27 Modified Oswestry Low Back Pain  Disability Questionnaire: 12 / 50 = 24.0 %  COGNITION: Overall cognitive status: Within functional limits for tasks assessed     SENSATION: WFL  MUSCLE LENGTH: Hamstrings: WFL   POSTURE: rounded shoulders and forward head With scoliosis thoracic curve concave to the left  PALPATION: No tenderness in bilateral glutes or paraspinals  LUMBAR ROM: *pain  AROM eval  Flexion Can touch toes  Extension 90% limited *  Right lateral flexion Bends to knee joint  Left lateral flexion Bends to above knee joint *  Right rotation full  Left rotation 80% limited   (Blank rows = not tested)  LOWER EXTREMITY ROM:   Eval: PROM:   Limited hip IR bilateral ; unable to achieve FABER position on Rt   UPPER EXTREMITY A/ROM:  11/04/2022: Right shoulder flexion: 110 deg Right shoulder abduction:  140 deg  UPPER EXTREMITY MMT:  11/04/2023: Left shoulder strength 4+ to 5-/5 grossly throughout Right shoulder strength grossly 4/5 throughout   LOWER EXTREMITY MMT:    MMT Right eval Left eval  Hip flexion 4- 4-  Hip extension    Hip abduction 4- 4-  Hip adduction 4 4  Hip internal rotation    Hip external rotation    Knee flexion 4 4  Knee extension 4 4  Ankle dorsiflexion    Ankle plantarflexion    Ankle inversion    Ankle eversion     (Blank rows = not tested)   FUNCTIONAL TESTS:  Eval: 5 times sit to stand: 15.89 sec with UE support Timed up and go (TUG): 12.95 sec patient caught right foot behind left. Decreased speed of turn  10/09/2023: 6 minute walk test:  1000 ft with RPE of 5-6/10 and pain increasing to 5/10 (age related norms are 1,155 ft)  GAIT: Comments: decreased cadence; Wide BOS  TREATMENT DATE:  11/20/2023: Nustep level 5 x7 min with PT present to discuss status 3 way stability ball stretch x 8 each direction Right shoulder abduction and scaption with blue stability ball x 10 Shoulder pulleys (scaption & flexion) x 2 mins each Standing shoulder rows with  red tband 2x10 standing on airex Standing shoulder extension with red TB + march x 20 (challenging.  SBA from PT. Patient frequently used stepping strategy to maintain balance) Step ups at stair holding 5# KB x 12 bilateral  Single leg on airex + opposite leg swing (forwards, sideways, backwards) x 10 each March on airex no UE support x 20 Farmer's carry holding 5# kettlebell x 2 lap around both gyms    11/18/2023: Nustep level 5 x6 min with PT present to discuss status 3 way stability ball stretch x 8 each direction Right shoulder abduction and scaption with blue stability ball x 10 Shoulder pulleys (abduction & flexion) x 2 mins each Standing shoulder rows with red tband 2x10 Standing shoulder press with red tband 2x10 Sit to/from stand holding 5# kettlebell x10 Farmer's carry holding 5# kettlebell x 1 lap around both gyms   11/13/2023: Nustep level 5 x7 min with PT present to discuss status 3 way stability ball stretch x 8 each direction Right shoulder abduction and scaption with blue stability ball x 10 Shoulder pulleys (abduction & flexion) x 2 mins each Discussion of things to ask MD tomorrow about possible injections; supported shoulder ROM exercises; ways to continue physical activity Single leg on airex + opposite leg swing (forwards, sideways, backwards) x 10 each Sit to stand holding 5# DB x 10 Airex step up + march with 5# unilateral hold x 12 bilateral  Farmer's carry holding 5# Dbs x 1 lap around both gyms    PATIENT EDUCATION:  Education details: Provided patient education and discussion on quality of life and stress management. Discussed motivators, challenges, and goals for future.  Person educated: Patient Education method: Explanation, Demonstration, and Handouts Education comprehension: verbalized understanding, returned demonstration, and needs further education  HOME EXERCISE PROGRAM: Access Code: [email or web address] URL: [email or web address] Date:  11/04/2023 Prepared by: Jarrell Menke  Exercises - Supine Lower Trunk Rotation  - 1 x daily - 7 x weekly - 1 sets - 8 reps - 4s hold - Supine Alternating Knee Taps with Hands  - 1 x daily - 7 x weekly - 1 sets - 10 reps - 3s hold - Sit to Stand with Armchair  - 1 x daily - 7 x weekly - 1-2 sets - 10 reps - Seated Hip Abduction with Resistance  - 1 x daily - 7 x weekly - 2 sets - 10 reps - Seated March with Resistance  - 1 x daily - 7 x weekly - 2 sets - 10 reps - Standing Shoulder Row with Anchored Resistance  - 1 x daily - 7 x weekly - 2 sets - 10 reps - Seated Horizontal Smooth Pursuit  - 1 x daily - 7 x weekly - 2 sets - 10 reps - Seated Proximal-Distal Smooth Pursuit  - 1 x daily - 7 x weekly - 2 sets - 10 reps - Shoulder Flexion Wall Slide with Towel  - 1 x daily - 7 x weekly - 2 sets - 10 reps - Supine Shoulder Flexion with Dowel  - 1 x daily - 7 x weekly - 2 sets - 10 reps  ASSESSMENT:  CLINICAL IMPRESSION: Jaeli continues to verbalize increased right shoulder pain when laying supine and with activity. She is scheduled to get an MRI on Saturday. Standing shoulder extension + march exercise was very challenging for patient. She frequently used stepping strategy to maintain balance. PT provided patient education on the three strategies used to maintain balance and how we want to encourage that in therapy sessions. Patient will benefit from skilled PT to address the below impairments and  improve overall function.    OBJECTIVE IMPAIRMENTS: Abnormal gait, decreased balance, decreased endurance, decreased mobility, difficulty walking, decreased ROM, decreased strength, hypomobility, increased muscle spasms, impaired flexibility, postural dysfunction, and pain.   ACTIVITY LIMITATIONS: carrying, lifting, bending, sitting, standing, squatting, stairs, transfers, bed mobility, and locomotion level  PARTICIPATION LIMITATIONS: meal prep, cleaning, laundry, interpersonal relationship, driving,  and community activity  PERSONAL FACTORS: Age, Fitness, Time since onset of injury/illness/exacerbation, and 1 comorbidity: Scoliosis are also affecting patient's functional outcome.   REHAB POTENTIAL: Good  CLINICAL DECISION MAKING: Evolving/moderate complexity  EVALUATION COMPLEXITY: Moderate   GOALS: Goals reviewed with patient? Yes  SHORT TERM GOALS: Target date: 10/24/2023 Patient will be independent with initial HEP. Baseline:  Goal status: Met on 10/09/23  2.  Patient will be able to participate in a to establish a baseline for functional test. Baseline:  Goal status: Met on 10/09/23 (1,000 ft with RPE of 5-6/10 and increased pain)   LONG TERM GOALS: Target date: 01/02/2024  Patient will demonstrate independence in advanced HEP. Baseline:  Goal status: Ongoing  2.  Patient will report > or = to 60% improvement in back and shoulder pain since starting PT. Baseline:  Goal status: Progressing   3.  Patient will verbalize and demonstrate self-care strategies to manage pain including tissue mobility practices and change of position. Baseline:  Goal status: Ongoing  4.  Patient will be able to walk across the street in her retirement facility with no back pain for improved functional mobility. Baseline:  Goal status: Progressing   5.  Patient will be able to carry at least 5# to help her ability to bring in groceries safely and without a loss of balance. Baseline:  Goal status: Progressing   6.  Patient will be able to ambulate at least 87ft with LRAD without a loss of balance to improve ability to ambulate community distances safely. Baseline:  Goal status: Ongoing  7.  Patient will be increase right shoulder A/ROM to [facility] to allow her to place items in overhead cabinets/shelves. Baseline:  right shoulder flexion 110 degrees on 11/04/23 Goal status:  Ongoing  PLAN:  PT FREQUENCY: 1-2x/week  PT DURATION: 8 weeks  PLANNED INTERVENTIONS: 97164- PT  Re-evaluation, 97750- Physical Performance Testing, 97110-Therapeutic exercises, 97530- Therapeutic activity, 97112- Neuromuscular re-education, 97535- Self Care, 02859- Manual therapy, [id number]- Gait training, [phone]- Canalith repositioning, [id number]- Aquatic Therapy, [phone]- Electrical stimulation (unattended), [phone]- Electrical stimulation (manual), [id number]- Vasopneumatic device, [id number]- Traction (mechanical), [id number]- Ionotophoresis 4mg /ml Dexamethasone, 79439 (1-2 muscles), [id number] (3+ muscles)- Dry Needling, Patient/Family education, Balance training, Stair training, Taping, Joint mobilization, Joint manipulation, Spinal manipulation, Spinal mobilization, Vestibular training, Cryotherapy, and Moist heat.  PLAN FOR NEXT SESSION: check MRI results; continue gentle ROM and strengthening; strengthening, flexibility/ROM, balance   Kristeen Sar, PT, DPT 11/20/23 10:13 AM [facility] [address] [place], [place] [address] Phone # [phone] Fax [phone]

## 2023-11-20 NOTE — Progress Notes (Signed)
   Subjective:    Patient ID: Sabrina Castaneda, female    DOB: 1935-03-06, 88 y.o.   MRN: [id number]  HPI Hyperlipidemia- chronic problem, on Atorvastatin .  Denies CP, abd pain, N/V.  + SOB w/ exertion w/in the last year.  Hypothyroid- chronic problem, on Levothyroxine  75mcg daily.  + fatigue.  Denies changes to skin/hair/nail  Fatigue- pt reports she has been going to PT for R shoulder pain and sciatica.  She has been in pain x2 months.     Review of Systems For ROS see HPI     Objective:   Physical Exam Vitals reviewed.  Constitutional:      General: She is not in acute distress.    Appearance: Normal appearance. She is well-developed. She is not ill-appearing.  HENT:     Head: Normocephalic and atraumatic.  Eyes:     Conjunctiva/sclera: Conjunctivae normal.     Pupils: Pupils are equal, round, and reactive to light.  Neck:     Thyroid : No thyromegaly.  Cardiovascular:     Rate and Rhythm: Normal rate and regular rhythm.     Pulses: Normal pulses.     Heart sounds: Normal heart sounds. No murmur heard. Pulmonary:     Effort: Pulmonary effort is normal. No respiratory distress.     Breath sounds: Normal breath sounds.  Abdominal:     General: There is no distension.     Palpations: Abdomen is soft.     Tenderness: There is no abdominal tenderness.  Musculoskeletal:     Cervical back: Normal range of motion and neck supple.     Right lower leg: No edema.     Left lower leg: No edema.  Lymphadenopathy:     Cervical: No cervical adenopathy.  Skin:    General: Skin is warm and dry.  Neurological:     General: No focal deficit present.     Mental Status: She is alert and oriented to person, place, and time.  Psychiatric:        Mood and Affect: Mood normal.        Behavior: Behavior normal.        Thought Content: Thought content normal.           Assessment & Plan:  SOB w/ exertion- new.  Last year pt was on an [place] safari and had no issues keeping up.  This  year she reports having SOB w/ exertion.  This is a big change for her.  Will refer to pulmonary for complete evaluation.  Pt expressed understanding and is in agreement w/ plan.   Fatigue- new.  Likely multifactorial- age, pain from Orthopedic issues, mood/stress (son in law needed a liver transplant after developing sepsis).  Check labs to r/o underlying metabolica causes.  Will follow.

## 2023-11-20 NOTE — Patient Instructions (Addendum)
 Schedule your complete physical in 6 months We'll notify you of your lab results and make any changes if needed We'll call you to schedule your Pulmonary appt Keep up the good work on healthy diet and regular physical activity Call with any questions or concerns Stay Safe!  Stay Healthy! Hang In There!!!

## 2023-11-21 ENCOUNTER — Ambulatory Visit: Payer: Self-pay | Admitting: Family Medicine

## 2023-11-21 NOTE — Assessment & Plan Note (Signed)
 Chronic problem.  On Atorvastatin  w/o difficulty.  Check labs.  Adjust meds prn

## 2023-11-21 NOTE — Assessment & Plan Note (Signed)
 Chronic problem.  On Levothyroxine  75mcg daily.  Reports fatigue, new onset SOB w/ exertion.  Check labs.  Adjust meds prn

## 2023-12-02 ENCOUNTER — Ambulatory Visit: Admitting: Rehabilitative and Restorative Service Providers"

## 2023-12-02 ENCOUNTER — Encounter: Payer: Self-pay | Admitting: Rehabilitative and Restorative Service Providers"

## 2023-12-02 DIAGNOSIS — M5459 Other low back pain: Secondary | ICD-10-CM

## 2023-12-02 DIAGNOSIS — M6281 Muscle weakness (generalized): Secondary | ICD-10-CM

## 2023-12-02 DIAGNOSIS — R2681 Unsteadiness on feet: Secondary | ICD-10-CM

## 2023-12-02 NOTE — Therapy (Signed)
 OUTPATIENT PHYSICAL THERAPY TREATMENT NOTE   Patient Name: Sabrina Castaneda MRN: [id number] DOB:[birth year], 88 y.o., female Today's Date: 12/02/2023     END OF SESSION:  PT End of Session - 12/02/23 0933     Visit Number 13    Date for Recertification  01/02/24    Authorization Type [employer]    Progress Note Due on Visit 17    PT Start Time 0930    PT Stop Time 1010    PT Time Calculation (min) 40 min    Activity Tolerance Patient tolerated treatment well    Behavior During Therapy [facility] for tasks assessed/performed                  Past Medical History:  Diagnosis Date   Allergy    GERD (gastroesophageal reflux disease)    History of chicken pox    Hyperlipidemia    Scoliosis    Thyroid  disease    Past Surgical History:  Procedure Laterality Date   ABDOMINAL HYSTERECTOMY  1980   ADENOIDECTOMY     HERNIA REPAIR  02/04/1966   REPLACEMENT TOTAL KNEE Right    TONSILLECTOMY     WRIST FRACTURE SURGERY Bilateral    Patient Active Problem List   Diagnosis Date Noted   Acquired trigger finger of left middle finger 03/11/2023   Neck pain 12/09/2022   Cervical radiculopathy 11/18/2022   Scoliosis of thoracic spine 11/18/2022   Depression 03/14/2020   Overweight (BMI 25.0-29.9) 03/14/2020   Physical exam 09/10/2019   Hyperlipidemia 08/16/2019   Hypothyroid 08/16/2019    PCP:  Mahlon Comer BRAVO, MD  REFERRING PROVIDER: Laqueta Ozell BIRCH, MD  REFERRING DIAG: Joint Pain  Rationale for Evaluation and Treatment: Rehabilitation  THERAPY DIAG:  Other low back pain  Muscle weakness (generalized)  Unsteadiness on feet  ONSET DATE: May 2025  SUBJECTIVE:                                                                                                                                                                                           SUBJECTIVE STATEMENT: Patient denies current pain in her back/shoulder.  However, she states that she at the end of  the day, her arm really starts to hurt her at approximately a 7-8/10.  Patient states that she had a right shoulder MRI that revealed a tear and states that the MD advised her that surgery was her only option, but she is not interested in surgery at this time.  PERTINENT HISTORY:  Scoliosis; Hx right knee replacement; hx cervical fusion, pt reports diplopia   PAIN:  Are you having pain? Yes:  NPRS scale: 0-8/10 Pain location: right shoulder/arm Pain description: stabbing pain that wouldn't go away Aggravating factors: walking across the street (she lives in [place]) Relieving factors: Nothing  PRECAUTIONS: Fall  RED FLAGS: None   WEIGHT BEARING RESTRICTIONS: No  FALLS:  Has patient fallen in last 6 months? Yes. Number of falls 1 fall in September 2025   LIVING ENVIRONMENT: Lives with: lives alone in [place]  Lives in: House/apartment Stairs: No Has following equipment at home: Single point cane  OCCUPATION: Retired  PLOF: Independent, Independent with basic ADLs, Independent with household mobility without device, Independent with community mobility without device, Independent with gait, Independent with transfers, and Leisure: Playing cards, hanging out friends, events at [employer], reading  PATIENT GOALS: Find something to strengthen the muscles and relieve the pain  NEXT MD VISIT: PRN  OBJECTIVE:  Note: Objective measures were completed at Evaluation unless otherwise noted.  DIAGNOSTIC FINDINGS:  From 09/16/2023 MD Note: X-ray lumbar spine (08/25/2023): 5 lumbar-type vertebral bodies with severe levoconvex lumbar scoliosis. Grade 1 anterolisthesis of L3 on L4 and L4 on L5. Severe disc space narrowing at L4-5 and notable at L3-4 and L5-S1. Facet arthrosis noted throughout the lower lumbar spine.    MRI Cervical spine (01/13/2023): C2-3 moderate left and moderate-severe right foraminal stenosis. C3-4 moderate biforaminal stenosis. C4-5 with moderate left foraminal  stenosis and C5-6 with moderate right foraminal stenosis.   MRI of Right Shoulder on 11/23/2023: Impression:  Full-thickness retracted 22x19 mm anterior supraspinatus footprint tear with of the supraspinatus muscle belly and decreases bulk without fatty infiltration. Moderate infraspinatus tendinosis without tear. Mild-to-moderate long head biceps tendinosis without tear or subluxation. Mild superior humeral head chondral thinning with a moderate-to-large effusion, mild synovitis, and fluid decompressing into the subacromial-subdeltoid bursa. Moderate AC joint osteoarthritis.  PATIENT SURVEYS:  Eval:  Modified Oswestry: 9/50 18%  Minimally Clinically Important Difference (MCID) = 12.8%  11/04/2023: Quick DASH 52.27 Modified Oswestry Low Back Pain Disability Questionnaire: 12 / 50 = 24.0 %  COGNITION: Overall cognitive status: Within functional limits for tasks assessed     SENSATION: WFL  MUSCLE LENGTH: Hamstrings: WFL   POSTURE: rounded shoulders and forward head With scoliosis thoracic curve concave to the left  PALPATION: No tenderness in bilateral glutes or paraspinals  LUMBAR ROM: *pain  AROM eval  Flexion Can touch toes  Extension 90% limited *  Right lateral flexion Bends to knee joint  Left lateral flexion Bends to above knee joint *  Right rotation full  Left rotation 80% limited   (Blank rows = not tested)  LOWER EXTREMITY ROM:   Eval: PROM:   Limited hip IR bilateral ; unable to achieve FABER position on Rt   UPPER EXTREMITY A/ROM:  11/04/2022: Right shoulder flexion: 110 deg Right shoulder abduction:  140 deg  UPPER EXTREMITY MMT:  11/04/2023: Left shoulder strength 4+ to 5-/5 grossly throughout Right shoulder strength grossly 4/5 throughout   LOWER EXTREMITY MMT:    MMT Right eval Left eval  Hip flexion 4- 4-  Hip extension    Hip abduction 4- 4-  Hip adduction 4 4  Hip internal rotation    Hip external rotation    Knee flexion 4 4   Knee extension 4 4  Ankle dorsiflexion    Ankle plantarflexion    Ankle inversion    Ankle eversion     (Blank rows = not tested)   FUNCTIONAL TESTS:  Eval: 5 times sit to stand: 15.89 sec with UE support Timed  up and go (TUG): 12.95 sec patient caught right foot behind left. Decreased speed of turn  10/09/2023: 6 minute walk test:  1000 ft with RPE of 5-6/10 and pain increasing to 5/10 (age related norms are 1,155 ft)  GAIT: Comments: decreased cadence; Wide BOS  TREATMENT DATE:   12/02/2023: Nustep level 5 x7 min with PT present to discuss status Shoulder pulleys (scaption & flexion) x 2 mins each Standing shoulder rows with red tband 2x10 Standing shoulder extension with red TB + march x 10 (challenging. SBA from PT. Patient frequently used stepping strategy to maintain balance) Marching on trampoline 2x1 min  Seated 3 way blue pball rollout x10 each direction Sit to/from stand holding 5# kettlebell x10 Attempted sit to stand with 5# kettlebell chest press, but was unable due to pain Seated modified Russian twist with 5# kettle bell 2x10 Side stepping with ball toss back and forth in front of PT mat x3 laps each direction   11/20/2023: Nustep level 5 x7 min with PT present to discuss status 3 way stability ball stretch x 8 each direction Right shoulder abduction and scaption with blue stability ball x 10 Shoulder pulleys (scaption & flexion) x 2 mins each Standing shoulder rows with red tband 2x10 standing on airex Standing shoulder extension with red TB + march x 20 (challenging. SBA from PT. Patient frequently used stepping strategy to maintain balance) Step ups at stair holding 5# KB x 12 bilateral  Single leg on airex + opposite leg swing (forwards, sideways, backwards) x 10 each March on airex no UE support x 20 Farmer's carry holding 5# kettlebell x 2 lap around both gyms    11/18/2023: Nustep level 5 x6 min with PT present to discuss status 3 way stability  ball stretch x 8 each direction Right shoulder abduction and scaption with blue stability ball x 10 Shoulder pulleys (abduction & flexion) x 2 mins each Standing shoulder rows with red tband 2x10 Standing shoulder press with red tband 2x10 Sit to/from stand holding 5# kettlebell x10 Farmer's carry holding 5# kettlebell x 1 lap around both gyms     PATIENT EDUCATION:  Education details: Provided patient education and discussion on quality of life and stress management. Discussed motivators, challenges, and goals for future.  Person educated: Patient Education method: Explanation, Demonstration, and Handouts Education comprehension: verbalized understanding, returned demonstration, and needs further education  HOME EXERCISE PROGRAM: Access Code: [email or web address] URL: [email or web address] Date: 11/04/2023 Prepared by: Jarrell Valdis Bevill  Exercises - Supine Lower Trunk Rotation  - 1 x daily - 7 x weekly - 1 sets - 8 reps - 4s hold - Supine Alternating Knee Taps with Hands  - 1 x daily - 7 x weekly - 1 sets - 10 reps - 3s hold - Sit to Stand with Armchair  - 1 x daily - 7 x weekly - 1-2 sets - 10 reps - Seated Hip Abduction with Resistance  - 1 x daily - 7 x weekly - 2 sets - 10 reps - Seated March with Resistance  - 1 x daily - 7 x weekly - 2 sets - 10 reps - Standing Shoulder Row with Anchored Resistance  - 1 x daily - 7 x weekly - 2 sets - 10 reps - Seated Horizontal Smooth Pursuit  - 1 x daily - 7 x weekly - 2 sets - 10 reps - Seated Proximal-Distal Smooth Pursuit  - 1 x daily - 7 x weekly - 2 sets - 10 reps - Shoulder  Flexion Wall Slide with Towel  - 1 x daily - 7 x weekly - 2 sets - 10 reps - Supine Shoulder Flexion with Dowel  - 1 x daily - 7 x weekly - 2 sets - 10 reps  ASSESSMENT:  CLINICAL IMPRESSION:  Ms Longino presents to skilled PT reporting that she was given the MRI reports and the MD advised her of the right rotator cuff tear (see above for impression of right  shoulder MRI).  Patient states that she is cautious afraid that she will fall again.  Patient denies current pain, but states that she has the pain later in the day. Overall, patient reports 75% improvement since starting PT.  Patient able to progress with goal related activities.  Patient continues to have difficulty with coordination of marching and shoulder extension with theraband and required cuing and SBA for safety.  Patient continues to require skilled PT to progress towards goal related activities.    OBJECTIVE IMPAIRMENTS: Abnormal gait, decreased balance, decreased endurance, decreased mobility, difficulty walking, decreased ROM, decreased strength, hypomobility, increased muscle spasms, impaired flexibility, postural dysfunction, and pain.   ACTIVITY LIMITATIONS: carrying, lifting, bending, sitting, standing, squatting, stairs, transfers, bed mobility, and locomotion level  PARTICIPATION LIMITATIONS: meal prep, cleaning, laundry, interpersonal relationship, driving, and community activity  PERSONAL FACTORS: Age, Fitness, Time since onset of injury/illness/exacerbation, and 1 comorbidity: Scoliosis are also affecting patient's functional outcome.   REHAB POTENTIAL: Good  CLINICAL DECISION MAKING: Evolving/moderate complexity  EVALUATION COMPLEXITY: Moderate   GOALS: Goals reviewed with patient? Yes  SHORT TERM GOALS: Target date: 10/24/2023 Patient will be independent with initial HEP. Baseline:  Goal status: Met on 10/09/23  2.  Patient will be able to participate in a to establish a baseline for functional test. Baseline:  Goal status: Met on 10/09/23 (1,000 ft with RPE of 5-6/10 and increased pain)   LONG TERM GOALS: Target date: 01/02/2024  Patient will demonstrate independence in advanced HEP. Baseline:  Goal status: Ongoing  2.  Patient will report > or = to 60% improvement in back and shoulder pain since starting PT. Baseline:  Goal status: Met on 12/02/2023  (pt reports 75% improvement)  3.  Patient will verbalize and demonstrate self-care strategies to manage pain including tissue mobility practices and change of position. Baseline:  Goal status: Ongoing  4.  Patient will be able to walk across the street in her retirement facility with no back pain for improved functional mobility. Baseline:  Goal status: Progressing   5.  Patient will be able to carry at least 5# to help her ability to bring in groceries safely and without a loss of balance. Baseline:  Goal status: Progressing   6.  Patient will be able to ambulate at least 86ft with LRAD without a loss of balance to improve ability to ambulate community distances safely. Baseline:  Goal status: Ongoing  7.  Patient will be increase right shoulder A/ROM to [facility] to allow her to place items in overhead cabinets/shelves. Baseline:  right shoulder flexion 110 degrees on 11/04/23 Goal status:  Ongoing  PLAN:  PT FREQUENCY: 1-2x/week  PT DURATION: 8 weeks  PLANNED INTERVENTIONS: 97164- PT Re-evaluation, 97750- Physical Performance Testing, 97110-Therapeutic exercises, 97530- Therapeutic activity, [id number]- Neuromuscular re-education, 97535- Self Care, [id number]- Manual therapy, [id number]- Gait training, [id number]- Canalith repositioning, [id number]- Aquatic Therapy, H9716- Electrical stimulation (unattended), [id number]- Electrical stimulation (manual), [id number]- Vasopneumatic device, [id number]- Traction (mechanical), [id number]- Ionotophoresis 4mg /ml Dexamethasone, 20560 (1-2 muscles), 20561 (3+ muscles)- Dry Needling, Patient/Family education,  Balance training, Stair training, Taping, Joint mobilization, Joint manipulation, Spinal manipulation, Spinal mobilization, Vestibular training, Cryotherapy, and Moist heat.  PLAN FOR NEXT SESSION: continue gentle ROM and strengthening; strengthening, flexibility/ROM, balance   Jarrell Laming, PT, DPT 12/02/23, 10:14 AM  [facility] [address] [place], [place] [address] Phone # [phone] Fax [phone]

## 2023-12-04 ENCOUNTER — Encounter: Payer: Self-pay | Admitting: Physical Therapy

## 2023-12-04 ENCOUNTER — Ambulatory Visit: Admitting: Physical Therapy

## 2023-12-04 DIAGNOSIS — M6281 Muscle weakness (generalized): Secondary | ICD-10-CM

## 2023-12-04 DIAGNOSIS — R2681 Unsteadiness on feet: Secondary | ICD-10-CM

## 2023-12-04 DIAGNOSIS — M5459 Other low back pain: Secondary | ICD-10-CM

## 2023-12-04 NOTE — Therapy (Signed)
 OUTPATIENT PHYSICAL THERAPY TREATMENT NOTE   Patient Name: Sabrina Castaneda MRN: [id number] DOB:[birth year], 88 y.o., female Today's Date: 12/04/2023     END OF SESSION:  PT End of Session - 12/04/23 1015     Visit Number 14    Date for Recertification  01/02/24    Authorization Type [employer]    Progress Note Due on Visit 17    PT Start Time 0930    PT Stop Time 1013    PT Time Calculation (min) 43 min    Activity Tolerance Patient tolerated treatment well    Behavior During Therapy WFL for tasks assessed/performed                   Past Medical History:  Diagnosis Date   Allergy    GERD (gastroesophageal reflux disease)    History of chicken pox    Hyperlipidemia    Scoliosis    Thyroid  disease    Past Surgical History:  Procedure Laterality Date   ABDOMINAL HYSTERECTOMY  1980   ADENOIDECTOMY     HERNIA REPAIR  02/04/1966   REPLACEMENT TOTAL KNEE Right    TONSILLECTOMY     WRIST FRACTURE SURGERY Bilateral    Patient Active Problem List   Diagnosis Date Noted   Acquired trigger finger of left middle finger 03/11/2023   Neck pain 12/09/2022   Cervical radiculopathy 11/18/2022   Scoliosis of thoracic spine 11/18/2022   Depression 03/14/2020   Overweight (BMI 25.0-29.9) 03/14/2020   Physical exam 09/10/2019   Hyperlipidemia 08/16/2019   Hypothyroid 08/16/2019    PCP:  Mahlon Comer BRAVO, MD  REFERRING PROVIDER: Laqueta Ozell BIRCH, MD  REFERRING DIAG: Joint Pain  Rationale for Evaluation and Treatment: Rehabilitation  THERAPY DIAG:  Other low back pain  Muscle weakness (generalized)  Unsteadiness on feet  ONSET DATE: May 2025  SUBJECTIVE:                                                                                                                                                                                           SUBJECTIVE STATEMENT: Patient reports having low level pain today.  PERTINENT HISTORY:  Scoliosis; Hx right  knee replacement; hx cervical fusion, pt reports diplopia   PAIN:  Are you having pain? Yes: NPRS scale: 0-8/10 Pain location: right shoulder/arm Pain description: stabbing pain that wouldn't go away Aggravating factors: walking across the street (she lives in [place]) Relieving factors: Nothing  PRECAUTIONS: Fall  RED FLAGS: None   WEIGHT BEARING RESTRICTIONS: No  FALLS:  Has patient fallen in last 6 months? Yes. Number of falls 1 fall in September  2025   LIVING ENVIRONMENT: Lives with: lives alone in [place]  Lives in: House/apartment Stairs: No Has following equipment at home: Single point cane  OCCUPATION: Retired  PLOF: Independent, Independent with basic ADLs, Independent with household mobility without device, Independent with community mobility without device, Independent with gait, Independent with transfers, and Leisure: Playing cards, hanging out friends, events at [employer], reading  PATIENT GOALS: Find something to strengthen the muscles and relieve the pain  NEXT MD VISIT: PRN  OBJECTIVE:  Note: Objective measures were completed at Evaluation unless otherwise noted.  DIAGNOSTIC FINDINGS:  From 09/16/2023 MD Note: X-ray lumbar spine (08/25/2023): 5 lumbar-type vertebral bodies with severe levoconvex lumbar scoliosis. Grade 1 anterolisthesis of L3 on L4 and L4 on L5. Severe disc space narrowing at L4-5 and notable at L3-4 and L5-S1. Facet arthrosis noted throughout the lower lumbar spine.    MRI Cervical spine (01/13/2023): C2-3 moderate left and moderate-severe right foraminal stenosis. C3-4 moderate biforaminal stenosis. C4-5 with moderate left foraminal stenosis and C5-6 with moderate right foraminal stenosis.   MRI of Right Shoulder on 11/23/2023: Impression:  Full-thickness retracted 22x19 mm anterior supraspinatus footprint tear with of the supraspinatus muscle belly and decreases bulk without fatty infiltration. Moderate infraspinatus tendinosis  without tear. Mild-to-moderate long head biceps tendinosis without tear or subluxation. Mild superior humeral head chondral thinning with a moderate-to-large effusion, mild synovitis, and fluid decompressing into the subacromial-subdeltoid bursa. Moderate AC joint osteoarthritis.  PATIENT SURVEYS:  Eval:  Modified Oswestry: 9/50 18%  Minimally Clinically Important Difference (MCID) = 12.8%  11/04/2023: Quick DASH 52.27 Modified Oswestry Low Back Pain Disability Questionnaire: 12 / 50 = 24.0 %  COGNITION: Overall cognitive status: Within functional limits for tasks assessed     SENSATION: WFL  MUSCLE LENGTH: Hamstrings: WFL   POSTURE: rounded shoulders and forward head With scoliosis thoracic curve concave to the left  PALPATION: No tenderness in bilateral glutes or paraspinals  LUMBAR ROM: *pain  AROM eval  Flexion Can touch toes  Extension 90% limited *  Right lateral flexion Bends to knee joint  Left lateral flexion Bends to above knee joint *  Right rotation full  Left rotation 80% limited   (Blank rows = not tested)  LOWER EXTREMITY ROM:   Eval: PROM:   Limited hip IR bilateral ; unable to achieve FABER position on Rt   UPPER EXTREMITY A/ROM:  11/04/2022: Right shoulder flexion: 110 deg Right shoulder abduction:  140 deg  UPPER EXTREMITY MMT:  11/04/2023: Left shoulder strength 4+ to 5-/5 grossly throughout Right shoulder strength grossly 4/5 throughout   LOWER EXTREMITY MMT:    MMT Right eval Left eval  Hip flexion 4- 4-  Hip extension    Hip abduction 4- 4-  Hip adduction 4 4  Hip internal rotation    Hip external rotation    Knee flexion 4 4  Knee extension 4 4  Ankle dorsiflexion    Ankle plantarflexion    Ankle inversion    Ankle eversion     (Blank rows = not tested)   FUNCTIONAL TESTS:  Eval: 5 times sit to stand: 15.89 sec with UE support Timed up and go (TUG): 12.95 sec patient caught right foot behind left. Decreased speed of  turn  10/09/2023: 6 minute walk test:  1000 ft with RPE of 5-6/10 and pain increasing to 5/10 (age related norms are 1,155 ft)  GAIT: Comments: decreased cadence; Wide BOS  TREATMENT DATE:  12/04/2023: Nustep level 5 x7 min with PT  present to discuss status Shoulder pulleys (scaption & flexion) x 2 mins each Finger ladder (flexion) x 3  Standing shoulder rows with green tband 2x10 Standing shoulder extension with green TB + march (first working same leg and arm then working opposite arm and leg) x 10 each Seated ear to hip 5# KB x 10 each direction (more painful going from left ear to right hip)  Sit to/from stand holding 5# kettlebell x10 Wall push up x 8 Lunge to BOSU (no UE support) - one LOB requiring stepping strategy x 10 bilateral  March on airex no UE support x 20 Airex step up + march x 10 bilateral (light UE support at counter) Standing heel raise 2 x 10 Resisted walking with handles 5# x 6 laps (increase next time) Seated 3 way blue pball rollout x8 each direction  12/02/2023: Nustep level 5 x7 min with PT present to discuss status Shoulder pulleys (scaption & flexion) x 2 mins each Standing shoulder rows with red tband 2x10 Standing shoulder extension with red TB + march x 10 (challenging. SBA from PT. Patient frequently used stepping strategy to maintain balance) Marching on trampoline 2x1 min  Seated 3 way blue pball rollout x10 each direction Sit to/from stand holding 5# kettlebell x10 Attempted sit to stand with 5# kettlebell chest press, but was unable due to pain Seated modified Russian twist with 5# kettle bell 2x10 Side stepping with ball toss back and forth in front of PT mat x3 laps each direction   11/20/2023: Nustep level 5 x7 min with PT present to discuss status 3 way stability ball stretch x 8 each direction Right shoulder abduction and scaption with blue stability ball x 10 Shoulder pulleys (scaption & flexion) x 2 mins each Standing shoulder rows  with red tband 2x10 standing on airex Standing shoulder extension with red TB + march x 20 (challenging. SBA from PT. Patient frequently used stepping strategy to maintain balance) Step ups at stair holding 5# KB x 12 bilateral  Single leg on airex + opposite leg swing (forwards, sideways, backwards) x 10 each March on airex no UE support x 20 Farmer's carry holding 5# kettlebell x 2 lap around both gyms    11/18/2023: Nustep level 5 x6 min with PT present to discuss status 3 way stability ball stretch x 8 each direction Right shoulder abduction and scaption with blue stability ball x 10 Shoulder pulleys (abduction & flexion) x 2 mins each Standing shoulder rows with red tband 2x10 Standing shoulder press with red tband 2x10 Sit to/from stand holding 5# kettlebell x10 Farmer's carry holding 5# kettlebell x 1 lap around both gyms     PATIENT EDUCATION:  Education details: Provided patient education and discussion on quality of life and stress management. Discussed motivators, challenges, and goals for future.  Person educated: Patient Education method: Explanation, Demonstration, and Handouts Education comprehension: verbalized understanding, returned demonstration, and needs further education  HOME EXERCISE PROGRAM: Access Code: [email or web address] URL: [email or web address] Date: 11/04/2023 Prepared by: Jarrell Menke  Exercises - Supine Lower Trunk Rotation  - 1 x daily - 7 x weekly - 1 sets - 8 reps - 4s hold - Supine Alternating Knee Taps with Hands  - 1 x daily - 7 x weekly - 1 sets - 10 reps - 3s hold - Sit to Stand with Armchair  - 1 x daily - 7 x weekly - 1-2 sets - 10 reps - Seated Hip Abduction with Resistance  - 1 x daily -  7 x weekly - 2 sets - 10 reps - Seated March with Resistance  - 1 x daily - 7 x weekly - 2 sets - 10 reps - Standing Shoulder Row with Anchored Resistance  - 1 x daily - 7 x weekly - 2 sets - 10 reps - Seated Horizontal Smooth Pursuit  - 1 x  daily - 7 x weekly - 2 sets - 10 reps - Seated Proximal-Distal Smooth Pursuit  - 1 x daily - 7 x weekly - 2 sets - 10 reps - Shoulder Flexion Wall Slide with Towel  - 1 x daily - 7 x weekly - 2 sets - 10 reps - Supine Shoulder Flexion with Dowel  - 1 x daily - 7 x weekly - 2 sets - 10 reps  ASSESSMENT:  CLINICAL IMPRESSION: Patient presents to skilled therapy with low pain levels today. Noted improved performance with shoulder extension + march today. Patient only have two LOB, but used ankle and hip strategy to maintain balance. Lunge to BOSU was a good challenge for patient's reactive balance. Noted occasional use of stepping strategy to maintain balance. Overall, patient is responding well to skilled therapy and should continue to progress well. Patient will benefit from skilled PT to address the below impairments and improve overall function.     OBJECTIVE IMPAIRMENTS: Abnormal gait, decreased balance, decreased endurance, decreased mobility, difficulty walking, decreased ROM, decreased strength, hypomobility, increased muscle spasms, impaired flexibility, postural dysfunction, and pain.   ACTIVITY LIMITATIONS: carrying, lifting, bending, sitting, standing, squatting, stairs, transfers, bed mobility, and locomotion level  PARTICIPATION LIMITATIONS: meal prep, cleaning, laundry, interpersonal relationship, driving, and community activity  PERSONAL FACTORS: Age, Fitness, Time since onset of injury/illness/exacerbation, and 1 comorbidity: Scoliosis are also affecting patient's functional outcome.   REHAB POTENTIAL: Good  CLINICAL DECISION MAKING: Evolving/moderate complexity  EVALUATION COMPLEXITY: Moderate   GOALS: Goals reviewed with patient? Yes  SHORT TERM GOALS: Target date: 10/24/2023 Patient will be independent with initial HEP. Baseline:  Goal status: Met on 10/09/23  2.  Patient will be able to participate in a to establish a baseline for functional test. Baseline:   Goal status: Met on 10/09/23 (1,000 ft with RPE of 5-6/10 and increased pain)   LONG TERM GOALS: Target date: 01/02/2024  Patient will demonstrate independence in advanced HEP. Baseline:  Goal status: Ongoing  2.  Patient will report > or = to 60% improvement in back and shoulder pain since starting PT. Baseline:  Goal status: Met on 12/02/2023 (pt reports 75% improvement)  3.  Patient will verbalize and demonstrate self-care strategies to manage pain including tissue mobility practices and change of position. Baseline:  Goal status: Ongoing  4.  Patient will be able to walk across the street in her retirement facility with no back pain for improved functional mobility. Baseline:  Goal status: Progressing   5.  Patient will be able to carry at least 5# to help her ability to bring in groceries safely and without a loss of balance. Baseline:  Goal status: Progressing   6.  Patient will be able to ambulate at least 893ft with LRAD without a loss of balance to improve ability to ambulate community distances safely. Baseline:  Goal status: Ongoing  7.  Patient will be increase right shoulder A/ROM to [address] [facility] to allow her to place items in overhead cabinets/shelves. Baseline:  right shoulder flexion 110 degrees on 11/04/23 Goal status:  Ongoing  PLAN:  PT FREQUENCY: 1-2x/week  PT DURATION: 8 weeks  PLANNED INTERVENTIONS: 97164- PT Re-evaluation, 97750- Physical Performance Testing, 97110-Therapeutic exercises, 97530- Therapeutic activity, [id number]- Neuromuscular re-education, [phone]- Self Care, 02859- Manual therapy, [phone]- Gait training, [phone]- Canalith repositioning, [phone]- Aquatic Therapy, [phone]- Electrical stimulation (unattended), [phone]- Electrical stimulation (manual), [id number]- Vasopneumatic device, [id number]- Traction (mechanical), [id number]- Ionotophoresis 4mg /ml Dexamethasone, 20560 (1-2 muscles), 20561 (3+ muscles)- Dry Needling, Patient/Family education, Balance training, Stair training,  Taping, Joint mobilization, Joint manipulation, Spinal manipulation, Spinal mobilization, Vestibular training, Cryotherapy, and Moist heat.  PLAN FOR NEXT SESSION: continue gentle ROM and strengthening; strengthening, flexibility/ROM, balance    Kristeen Sar, PT, DPT 12/04/23 10:16 AM [facility] [address], [address] [place], [place] [address] Phone # [phone] Fax [phone]

## 2023-12-09 ENCOUNTER — Encounter: Payer: Self-pay | Admitting: Physical Therapy

## 2023-12-09 ENCOUNTER — Ambulatory Visit: Attending: Anesthesiology | Admitting: Physical Therapy

## 2023-12-09 DIAGNOSIS — M6281 Muscle weakness (generalized): Secondary | ICD-10-CM | POA: Insufficient documentation

## 2023-12-09 DIAGNOSIS — R2681 Unsteadiness on feet: Secondary | ICD-10-CM | POA: Diagnosis present

## 2023-12-09 DIAGNOSIS — M5459 Other low back pain: Secondary | ICD-10-CM | POA: Diagnosis present

## 2023-12-09 NOTE — Therapy (Signed)
 OUTPATIENT PHYSICAL THERAPY TREATMENT NOTE   Patient Name: Sabrina Castaneda MRN: [id number] DOB:Jul 27, 1935, 88 y.o., female Today's Date: 12/09/2023     END OF SESSION:  PT End of Session - 12/09/23 1020     Visit Number 15    Date for Recertification  01/02/24    Authorization Type [employer]    Progress Note Due on Visit 17    PT Start Time 0933    PT Stop Time 1015    PT Time Calculation (min) 42 min    Activity Tolerance Patient tolerated treatment well    Behavior During Therapy [facility] for tasks assessed/performed                    Past Medical History:  Diagnosis Date   Allergy    GERD (gastroesophageal reflux disease)    History of chicken pox    Hyperlipidemia    Scoliosis    Thyroid  disease    Past Surgical History:  Procedure Laterality Date   ABDOMINAL HYSTERECTOMY  1980   ADENOIDECTOMY     HERNIA REPAIR  02/04/1966   REPLACEMENT TOTAL KNEE Right    TONSILLECTOMY     WRIST FRACTURE SURGERY Bilateral    Patient Active Problem List   Diagnosis Date Noted   Acquired trigger finger of left middle finger 03/11/2023   Neck pain 12/09/2022   Cervical radiculopathy 11/18/2022   Scoliosis of thoracic spine 11/18/2022   Depression 03/14/2020   Overweight (BMI 25.0-29.9) 03/14/2020   Physical exam 09/10/2019   Hyperlipidemia 08/16/2019   Hypothyroid 08/16/2019    PCP:  Mahlon Comer BRAVO, MD  REFERRING PROVIDER: Laqueta Ozell BIRCH, MD  REFERRING DIAG: Joint Pain  Rationale for Evaluation and Treatment: Rehabilitation  THERAPY DIAG:  Other low back pain  Muscle weakness (generalized)  Unsteadiness on feet  ONSET DATE: May 2025  SUBJECTIVE:                                                                                                                                                                                           SUBJECTIVE STATEMENT: Patient reports she is doing good today. Her pain levels have been a lot lower since  getting the shot.  PERTINENT HISTORY:  Scoliosis; Hx right knee replacement; hx cervical fusion, pt reports diplopia   PAIN:  Are you having pain? Yes: NPRS scale: 0-8/10 Pain location: right shoulder/arm Pain description: stabbing pain that wouldn't go away Aggravating factors: walking across the street (she lives in [place]) Relieving factors: Nothing  PRECAUTIONS: Fall  RED FLAGS: None   WEIGHT BEARING RESTRICTIONS: No  FALLS:  Has patient  fallen in last 6 months? Yes. Number of falls 1 fall in September 2025   LIVING ENVIRONMENT: Lives with: lives alone in [place]  Lives in: House/apartment Stairs: No Has following equipment at home: Single point cane  OCCUPATION: Retired  PLOF: Independent, Independent with basic ADLs, Independent with household mobility without device, Independent with community mobility without device, Independent with gait, Independent with transfers, and Leisure: Playing cards, hanging out friends, events at [employer], reading  PATIENT GOALS: Find something to strengthen the muscles and relieve the pain  NEXT MD VISIT: PRN  OBJECTIVE:  Note: Objective measures were completed at Evaluation unless otherwise noted.  DIAGNOSTIC FINDINGS:  From 09/16/2023 MD Note: X-ray lumbar spine (08/25/2023): 5 lumbar-type vertebral bodies with severe levoconvex lumbar scoliosis. Grade 1 anterolisthesis of L3 on L4 and L4 on L5. Severe disc space narrowing at L4-5 and notable at L3-4 and L5-S1. Facet arthrosis noted throughout the lower lumbar spine.    MRI Cervical spine (01/13/2023): C2-3 moderate left and moderate-severe right foraminal stenosis. C3-4 moderate biforaminal stenosis. C4-5 with moderate left foraminal stenosis and C5-6 with moderate right foraminal stenosis.   MRI of Right Shoulder on 11/23/2023: Impression:  Full-thickness retracted 22x19 mm anterior supraspinatus footprint tear with of the supraspinatus muscle belly and decreases bulk  without fatty infiltration. Moderate infraspinatus tendinosis without tear. Mild-to-moderate long head biceps tendinosis without tear or subluxation. Mild superior humeral head chondral thinning with a moderate-to-large effusion, mild synovitis, and fluid decompressing into the subacromial-subdeltoid bursa. Moderate AC joint osteoarthritis.  PATIENT SURVEYS:  Eval:  Modified Oswestry: 9/50 18%  Minimally Clinically Important Difference (MCID) = 12.8%  11/04/2023: Quick DASH 52.27 Modified Oswestry Low Back Pain Disability Questionnaire: 12 / 50 = 24.0 %  COGNITION: Overall cognitive status: Within functional limits for tasks assessed     SENSATION: WFL  MUSCLE LENGTH: Hamstrings: WFL   POSTURE: rounded shoulders and forward head With scoliosis thoracic curve concave to the left  PALPATION: No tenderness in bilateral glutes or paraspinals  LUMBAR ROM: *pain  AROM eval  Flexion Can touch toes  Extension 90% limited *  Right lateral flexion Bends to knee joint  Left lateral flexion Bends to above knee joint *  Right rotation full  Left rotation 80% limited   (Blank rows = not tested)  LOWER EXTREMITY ROM:   Eval: PROM:   Limited hip IR bilateral ; unable to achieve FABER position on Rt   UPPER EXTREMITY A/ROM:  11/04/2022: Right shoulder flexion: 110 deg Right shoulder abduction:  140 deg  UPPER EXTREMITY MMT:  11/04/2023: Left shoulder strength 4+ to 5-/5 grossly throughout Right shoulder strength grossly 4/5 throughout   LOWER EXTREMITY MMT:    MMT Right eval Left eval  Hip flexion 4- 4-  Hip extension    Hip abduction 4- 4-  Hip adduction 4 4  Hip internal rotation    Hip external rotation    Knee flexion 4 4  Knee extension 4 4  Ankle dorsiflexion    Ankle plantarflexion    Ankle inversion    Ankle eversion     (Blank rows = not tested)   FUNCTIONAL TESTS:  Eval: 5 times sit to stand: 15.89 sec with UE support Timed up and go (TUG): 12.95  sec patient caught right foot behind left. Decreased speed of turn  10/09/2023: 6 minute walk test:  1000 ft with RPE of 5-6/10 and pain increasing to 5/10 (age related norms are 1,155 ft)  GAIT: Comments: decreased cadence; Wide  BOS  TREATMENT DATE:  12/09/2023: Nustep level 5 x7 min with PT present to discuss status Standing shoulder rows & extension  with green tband 2x10 while standing on airex 6 inch step ups holding 4# DBS x 10 bilateral  March on airex with 4#DBS (one waist height one shoulder height) 2 x 12 (switching arms) Sit to/from stand holding 4# kettlebell x12 Hurdles (forwards/sideways) - step over pattern no UE support x 4 laps each Lunge to BOSU (no UE support) - no LOB today Standing heel raise 2 x 10 holding 4# Dbs Wall push up x 8 Farmer's carry with 4# Dbs x 1 lap around both gyms     12/04/2023: Nustep level 5 x7 min with PT present to discuss status Shoulder pulleys (scaption & flexion) x 2 mins each Finger ladder (flexion) x 3  Standing shoulder rows with green tband 2x10 Standing shoulder extension with green TB + march (first working same leg and arm then working opposite arm and leg) x 10 each Seated ear to hip 5# KB x 10 each direction (more painful going from left ear to right hip)  Sit to/from stand holding 5# kettlebell x10 Wall push up x 8 Lunge to BOSU (no UE support) - one LOB requiring stepping strategy x 10 bilateral  March on airex no UE support x 20 Airex step up + march x 10 bilateral (light UE support at counter) Standing heel raise 2 x 10 Resisted walking with handles 5# x 6 laps (increase next time) Seated 3 way blue pball rollout x8 each direction  12/02/2023: Nustep level 5 x7 min with PT present to discuss status Shoulder pulleys (scaption & flexion) x 2 mins each Standing shoulder rows with red tband 2x10 Standing shoulder extension with red TB + march x 10 (challenging. SBA from PT. Patient frequently used stepping strategy to  maintain balance) Marching on trampoline 2x1 min  Seated 3 way blue pball rollout x10 each direction Sit to/from stand holding 5# kettlebell x10 Attempted sit to stand with 5# kettlebell chest press, but was unable due to pain Seated modified Russian twist with 5# kettle bell 2x10 Side stepping with ball toss back and forth in front of PT mat x3 laps each direction   11/20/2023: Nustep level 5 x7 min with PT present to discuss status 3 way stability ball stretch x 8 each direction Right shoulder abduction and scaption with blue stability ball x 10 Shoulder pulleys (scaption & flexion) x 2 mins each Standing shoulder rows with red tband 2x10 standing on airex Standing shoulder extension with red TB + march x 20 (challenging. SBA from PT. Patient frequently used stepping strategy to maintain balance) Step ups at stair holding 5# KB x 12 bilateral  Single leg on airex + opposite leg swing (forwards, sideways, backwards) x 10 each March on airex no UE support x 20 Farmer's carry holding 5# kettlebell x 2 lap around both gyms     PATIENT EDUCATION:  Education details: Provided patient education and discussion on quality of life and stress management. Discussed motivators, challenges, and goals for future.  Person educated: Patient Education method: Explanation, Demonstration, and Handouts Education comprehension: verbalized understanding, returned demonstration, and needs further education  HOME EXERCISE PROGRAM: Access Code: [email or web address] URL: [email or web address] Date: 11/04/2023 Prepared by: Jarrell Menke  Exercises - Supine Lower Trunk Rotation  - 1 x daily - 7 x weekly - 1 sets - 8 reps - 4s hold - Supine Alternating Knee Taps with Hands  -  1 x daily - 7 x weekly - 1 sets - 10 reps - 3s hold - Sit to Stand with Armchair  - 1 x daily - 7 x weekly - 1-2 sets - 10 reps - Seated Hip Abduction with Resistance  - 1 x daily - 7 x weekly - 2 sets - 10 reps - Seated March  with Resistance  - 1 x daily - 7 x weekly - 2 sets - 10 reps - Standing Shoulder Row with Anchored Resistance  - 1 x daily - 7 x weekly - 2 sets - 10 reps - Seated Horizontal Smooth Pursuit  - 1 x daily - 7 x weekly - 2 sets - 10 reps - Seated Proximal-Distal Smooth Pursuit  - 1 x daily - 7 x weekly - 2 sets - 10 reps - Shoulder Flexion Wall Slide with Towel  - 1 x daily - 7 x weekly - 2 sets - 10 reps - Supine Shoulder Flexion with Dowel  - 1 x daily - 7 x weekly - 2 sets - 10 reps  ASSESSMENT:  CLINICAL IMPRESSION: Peytan continues to verbalize low shoulder pain levels. Progressed single leg balance exercises today. Patient was challenged with activities with decreased UE support. Noted one LOB with side stepping with hurdles requiring UE support. She verbalized feeling a challenge with balance exercises today. PT provided guarding when needed for patient safety. PT verbalized noting improvements in her balance and overall walking ability Patient will benefit from skilled PT to address the below impairments and improve overall function.      OBJECTIVE IMPAIRMENTS: Abnormal gait, decreased balance, decreased endurance, decreased mobility, difficulty walking, decreased ROM, decreased strength, hypomobility, increased muscle spasms, impaired flexibility, postural dysfunction, and pain.   ACTIVITY LIMITATIONS: carrying, lifting, bending, sitting, standing, squatting, stairs, transfers, bed mobility, and locomotion level  PARTICIPATION LIMITATIONS: meal prep, cleaning, laundry, interpersonal relationship, driving, and community activity  PERSONAL FACTORS: Age, Fitness, Time since onset of injury/illness/exacerbation, and 1 comorbidity: Scoliosis are also affecting patient's functional outcome.   REHAB POTENTIAL: Good  CLINICAL DECISION MAKING: Evolving/moderate complexity  EVALUATION COMPLEXITY: Moderate   GOALS: Goals reviewed with patient? Yes  SHORT TERM GOALS: Target date:  10/24/2023 Patient will be independent with initial HEP. Baseline:  Goal status: Met on 10/09/23  2.  Patient will be able to participate in a to establish a baseline for functional test. Baseline:  Goal status: Met on 10/09/23 (1,000 ft with RPE of 5-6/10 and increased pain)   LONG TERM GOALS: Target date: 01/02/2024  Patient will demonstrate independence in advanced HEP. Baseline:  Goal status: Ongoing  2.  Patient will report > or = to 60% improvement in back and shoulder pain since starting PT. Baseline:  Goal status: Met on 12/02/2023 (pt reports 75% improvement)  3.  Patient will verbalize and demonstrate self-care strategies to manage pain including tissue mobility practices and change of position. Baseline:  Goal status: Ongoing  4.  Patient will be able to walk across the street in her retirement facility with no back pain for improved functional mobility. Baseline:  Goal status: Progressing   5.  Patient will be able to carry at least 5# to help her ability to bring in groceries safely and without a loss of balance. Baseline:  Goal status: Progressing   6.  Patient will be able to ambulate at least 81ft with LRAD without a loss of balance to improve ability to ambulate community distances safely. Baseline:  Goal status: Ongoing  7.  Patient will be increase right shoulder A/ROM to [facility] to allow her to place items in overhead cabinets/shelves. Baseline:  right shoulder flexion 110 degrees on 11/04/23 Goal status:  Ongoing  PLAN:  PT FREQUENCY: 1-2x/week  PT DURATION: 8 weeks  PLANNED INTERVENTIONS: 97164- PT Re-evaluation, 97750- Physical Performance Testing, 97110-Therapeutic exercises, 97530- Therapeutic activity, 97112- Neuromuscular re-education, 97535- Self Care, [id number]- Manual therapy, [phone]- Gait training, [phone]- Canalith repositioning, [id number]- Aquatic Therapy, [phone]- Electrical stimulation (unattended), [phone]- Electrical stimulation (manual), [id number]-  Vasopneumatic device, [id number]- Traction (mechanical), [id number]- Ionotophoresis 4mg /ml Dexamethasone, 79439 (1-2 muscles), [id number] (3+ muscles)- Dry Needling, Patient/Family education, Balance training, Stair training, Taping, Joint mobilization, Joint manipulation, Spinal manipulation, Spinal mobilization, Vestibular training, Cryotherapy, and Moist heat.  PLAN FOR NEXT SESSION: continue gentle ROM and strengthening; strengthening, flexibility/ROM, balance    Kristeen Sar, PT, DPT 12/09/23 10:21 AM [facility] [address], [address] [place], [place] [address] Phone # [phone] Fax [phone]

## 2023-12-11 ENCOUNTER — Ambulatory Visit: Admitting: Rehabilitative and Restorative Service Providers"

## 2023-12-11 ENCOUNTER — Encounter: Payer: Self-pay | Admitting: Rehabilitative and Restorative Service Providers"

## 2023-12-11 DIAGNOSIS — M5459 Other low back pain: Secondary | ICD-10-CM

## 2023-12-11 DIAGNOSIS — R2681 Unsteadiness on feet: Secondary | ICD-10-CM

## 2023-12-11 DIAGNOSIS — M6281 Muscle weakness (generalized): Secondary | ICD-10-CM

## 2023-12-11 NOTE — Therapy (Signed)
 OUTPATIENT PHYSICAL THERAPY TREATMENT NOTE   Patient Name: Sabrina Castaneda MRN: [id number] DOB:02-15-1935, 88 y.o., female Today's Date: 12/11/2023     END OF SESSION:  PT End of Session - 12/11/23 0929     Visit Number 16    Date for Recertification  01/02/24    Authorization Type [employer]    Progress Note Due on Visit 17    PT Start Time 0927    PT Stop Time 1005    PT Time Calculation (min) 38 min    Activity Tolerance Patient tolerated treatment well    Behavior During Therapy [facility] for tasks assessed/performed            Past Medical History:  Diagnosis Date   Allergy    GERD (gastroesophageal reflux disease)    History of chicken pox    Hyperlipidemia    Scoliosis    Thyroid  disease    Past Surgical History:  Procedure Laterality Date   ABDOMINAL HYSTERECTOMY  1980   ADENOIDECTOMY     HERNIA REPAIR  02/04/1966   REPLACEMENT TOTAL KNEE Right    TONSILLECTOMY     WRIST FRACTURE SURGERY Bilateral    Patient Active Problem List   Diagnosis Date Noted   Acquired trigger finger of left middle finger 03/11/2023   Neck pain 12/09/2022   Cervical radiculopathy 11/18/2022   Scoliosis of thoracic spine 11/18/2022   Depression 03/14/2020   Overweight (BMI 25.0-29.9) 03/14/2020   Physical exam 09/10/2019   Hyperlipidemia 08/16/2019   Hypothyroid 08/16/2019    PCP:  Mahlon Comer BRAVO, MD  REFERRING PROVIDER: Laqueta Ozell BIRCH, MD  REFERRING DIAG: Joint Pain  Rationale for Evaluation and Treatment: Rehabilitation  THERAPY DIAG:  Other low back pain  Muscle weakness (generalized)  Unsteadiness on feet  ONSET DATE: May 2025  SUBJECTIVE:                                                                                                                                                                                           SUBJECTIVE STATEMENT: Patient reports that she had a big fall on Tuesday night where she sustained a large bruise on her right  shin.  Patient reports that she was [place] and her foot hit a hole that she did not see and she hit her shin.  PERTINENT HISTORY:  Scoliosis; Hx right knee replacement; hx cervical fusion, pt reports diplopia   PAIN:  Are you having pain? Yes: NPRS scale: currently 0/10 Pain location: right shoulder/arm Pain description: stabbing pain that wouldn't go away Aggravating factors: walking across  the street (she lives in [place]) Relieving factors: Nothing  PRECAUTIONS: Fall  RED FLAGS: None   WEIGHT BEARING RESTRICTIONS: No  FALLS:  Has patient fallen in last 6 months? Yes. Number of falls 1 fall in September 2025   LIVING ENVIRONMENT: Lives with: lives alone in [place]  Lives in: House/apartment Stairs: No Has following equipment at home: Single point cane  OCCUPATION: Retired  PLOF: Independent, Independent with basic ADLs, Independent with household mobility without device, Independent with community mobility without device, Independent with gait, Independent with transfers, and Leisure: Playing cards, hanging out friends, events at [employer], reading  PATIENT GOALS: Find something to strengthen the muscles and relieve the pain  NEXT MD VISIT: PRN  OBJECTIVE:  Note: Objective measures were completed at Evaluation unless otherwise noted.  DIAGNOSTIC FINDINGS:  From 09/16/2023 MD Note: X-ray lumbar spine (08/25/2023): 5 lumbar-type vertebral bodies with severe levoconvex lumbar scoliosis. Grade 1 anterolisthesis of L3 on L4 and L4 on L5. Severe disc space narrowing at L4-5 and notable at L3-4 and L5-S1. Facet arthrosis noted throughout the lower lumbar spine.    MRI Cervical spine (01/13/2023): C2-3 moderate left and moderate-severe right foraminal stenosis. C3-4 moderate biforaminal stenosis. C4-5 with moderate left foraminal stenosis and C5-6 with moderate right foraminal stenosis.   MRI of Right Shoulder on  11/23/2023: Impression:  Full-thickness retracted 22x19 mm anterior supraspinatus footprint tear with of the supraspinatus muscle belly and decreases bulk without fatty infiltration. Moderate infraspinatus tendinosis without tear. Mild-to-moderate long head biceps tendinosis without tear or subluxation. Mild superior humeral head chondral thinning with a moderate-to-large effusion, mild synovitis, and fluid decompressing into the subacromial-subdeltoid bursa. Moderate AC joint osteoarthritis.  PATIENT SURVEYS:  Eval:  Modified Oswestry: 9/50 18%  Minimally Clinically Important Difference (MCID) = 12.8%  11/04/2023: Quick DASH 52.27 Modified Oswestry Low Back Pain Disability Questionnaire: 12 / 50 = 24.0 %  COGNITION: Overall cognitive status: Within functional limits for tasks assessed     SENSATION: WFL  MUSCLE LENGTH: Hamstrings: WFL   POSTURE: rounded shoulders and forward head With scoliosis thoracic curve concave to the left  PALPATION: No tenderness in bilateral glutes or paraspinals  LUMBAR ROM: *pain  AROM eval  Flexion Can touch toes  Extension 90% limited *  Right lateral flexion Bends to knee joint  Left lateral flexion Bends to above knee joint *  Right rotation full  Left rotation 80% limited   (Blank rows = not tested)  LOWER EXTREMITY ROM:   Eval: PROM:   Limited hip IR bilateral ; unable to achieve FABER position on Rt   UPPER EXTREMITY A/ROM:  11/04/2022: Right shoulder flexion: 110 deg Right shoulder abduction:  140 deg  UPPER EXTREMITY MMT:  11/04/2023: Left shoulder strength 4+ to 5-/5 grossly throughout Right shoulder strength grossly 4/5 throughout   LOWER EXTREMITY MMT:    MMT Right eval Left eval  Hip flexion 4- 4-  Hip extension    Hip abduction 4- 4-  Hip adduction 4 4  Hip internal rotation    Hip external rotation    Knee flexion 4 4  Knee extension 4 4  Ankle dorsiflexion    Ankle plantarflexion    Ankle inversion     Ankle eversion     (Blank rows = not tested)   FUNCTIONAL TESTS:  Eval: 5 times sit to stand: 15.89 sec with UE support Timed up and go (TUG): 12.95 sec patient caught right foot behind left. Decreased speed of turn  10/09/2023: 6  minute walk test:  1000 ft with RPE of 5-6/10 and pain increasing to 5/10 (age related norms are 1,155 ft)  12/11/2023: 6 minute walk test:  1170 ft with RPE of 5/10  GAIT: Comments: decreased cadence; Wide BOS  TREATMENT DATE:   12/11/2023: Nustep level 5 x7 min with PT present to discuss status 6 minute walk test:  1170 ft with RPE of 5/10 Lunge to BOSU (no UE support) - x10 bilat no LOB today Standing heel raises holding 4# dumbbells 2x10 Standing marching holding 4# dumbbells at shoulder height x10 bilat Standing high marching x10 Seated 3 way blue pball rollout x10 each direction FWD step ups on 6 step holding 4# dumbbells  Wall push up 2x5 Sit to/from stand with 5# kettlebell x12   12/09/2023: Nustep level 5 x7 min with PT present to discuss status Standing shoulder rows & extension  with green tband 2x10 while standing on airex 6 inch step ups holding 4# DBS x 10 bilateral  March on airex with 4#DBS (one waist height one shoulder height) 2 x 12 (switching arms) Sit to/from stand holding 4# kettlebell x12 Hurdles (forwards/sideways) - step over pattern no UE support x 4 laps each Lunge to BOSU (no UE support) - no LOB today Standing heel raise 2 x 10 holding 4# Dbs Wall push up x 8 Farmer's carry with 4# Dbs x 1 lap around both gyms     12/04/2023: Nustep level 5 x7 min with PT present to discuss status Shoulder pulleys (scaption & flexion) x 2 mins each Finger ladder (flexion) x 3  Standing shoulder rows with green tband 2x10 Standing shoulder extension with green TB + march (first working same leg and arm then working opposite arm and leg) x 10 each Seated ear to hip 5# KB x 10 each direction (more painful going from left ear to  right hip)  Sit to/from stand holding 5# kettlebell x10 Wall push up x 8 Lunge to BOSU (no UE support) - one LOB requiring stepping strategy x 10 bilateral  March on airex no UE support x 20 Airex step up + march x 10 bilateral (light UE support at counter) Standing heel raise 2 x 10 Resisted walking with handles 5# x 6 laps (increase next time) Seated 3 way blue pball rollout x8 each direction    PATIENT EDUCATION:  Education details: Provided patient education and discussion on quality of life and stress management. Discussed motivators, challenges, and goals for future.  Person educated: Patient Education method: Explanation, Demonstration, and Handouts Education comprehension: verbalized understanding, returned demonstration, and needs further education  HOME EXERCISE PROGRAM: Access Code: [email or web address] URL: [email or web address] Date: 11/04/2023 Prepared by: Jarrell Arhum Peeples  Exercises - Supine Lower Trunk Rotation  - 1 x daily - 7 x weekly - 1 sets - 8 reps - 4s hold - Supine Alternating Knee Taps with Hands  - 1 x daily - 7 x weekly - 1 sets - 10 reps - 3s hold - Sit to Stand with Armchair  - 1 x daily - 7 x weekly - 1-2 sets - 10 reps - Seated Hip Abduction with Resistance  - 1 x daily - 7 x weekly - 2 sets - 10 reps - Seated March with Resistance  - 1 x daily - 7 x weekly - 2 sets - 10 reps - Standing Shoulder Row with Anchored Resistance  - 1 x daily - 7 x weekly - 2 sets - 10 reps - Seated Horizontal Smooth Pursuit  -  1 x daily - 7 x weekly - 2 sets - 10 reps - Seated Proximal-Distal Smooth Pursuit  - 1 x daily - 7 x weekly - 2 sets - 10 reps - Shoulder Flexion Wall Slide with Towel  - 1 x daily - 7 x weekly - 2 sets - 10 reps - Supine Shoulder Flexion with Dowel  - 1 x daily - 7 x weekly - 2 sets - 10 reps  ASSESSMENT:  CLINICAL IMPRESSION:  Ms Wendel presents to skilled PT reporting that she is had some increased pain in her shoulder since her Tuesday visit,  but she is feeling better today.  Patient admits to a fall Tuesday night when she was walking with her friend in their community on their way back to her home and it was dark and she did not see the hole in the ground and tripped.  Patient with a large bruise noted on her right shin.  Patient with improved distance noted on her 6 minute walk test today.  Patient continues to have some difficulty with lifting weight in right hand up to shoulder heigh.  Patient continues to require skilled PT to progress towards goal related activities.   OBJECTIVE IMPAIRMENTS: Abnormal gait, decreased balance, decreased endurance, decreased mobility, difficulty walking, decreased ROM, decreased strength, hypomobility, increased muscle spasms, impaired flexibility, postural dysfunction, and pain.   ACTIVITY LIMITATIONS: carrying, lifting, bending, sitting, standing, squatting, stairs, transfers, bed mobility, and locomotion level  PARTICIPATION LIMITATIONS: meal prep, cleaning, laundry, interpersonal relationship, driving, and community activity  PERSONAL FACTORS: Age, Fitness, Time since onset of injury/illness/exacerbation, and 1 comorbidity: Scoliosis are also affecting patient's functional outcome.   REHAB POTENTIAL: Good  CLINICAL DECISION MAKING: Evolving/moderate complexity  EVALUATION COMPLEXITY: Moderate   GOALS: Goals reviewed with patient? Yes  SHORT TERM GOALS: Target date: 10/24/2023 Patient will be independent with initial HEP. Baseline:  Goal status: Met on 10/09/23  2.  Patient will be able to participate in a to establish a baseline for functional test. Baseline:  Goal status: Met on 10/09/23 (1,000 ft with RPE of 5-6/10 and increased pain)   LONG TERM GOALS: Target date: 01/02/2024  Patient will demonstrate independence in advanced HEP. Baseline:  Goal status: Ongoing  2.  Patient will report > or = to 60% improvement in back and shoulder pain since starting PT. Baseline:  Goal  status: Met on 12/02/2023 (pt reports 75% improvement)  3.  Patient will verbalize and demonstrate self-care strategies to manage pain including tissue mobility practices and change of position. Baseline:  Goal status: Ongoing  4.  Patient will be able to walk across the street in her retirement facility with no back pain for improved functional mobility. Baseline:  Goal status: Progressing   5.  Patient will be able to carry at least 5# to help her ability to bring in groceries safely and without a loss of balance. Baseline:  Goal status: Progressing   6.  Patient will be able to ambulate at least 831ft with LRAD without a loss of balance to improve ability to ambulate community distances safely. Baseline:  Goal status: Ongoing (one fall on 12/09/23 secondary to tripping in a hole)  7.  Patient will be increase right shoulder A/ROM to [facility] to allow her to place items in overhead cabinets/shelves. Baseline:  right shoulder flexion 110 degrees on 11/04/23 Goal status:  Ongoing  PLAN:  PT FREQUENCY: 1-2x/week  PT DURATION: 8 weeks  PLANNED INTERVENTIONS: 97164- PT Re-evaluation, 97750- Physical Performance  Testing, 97110-Therapeutic exercises, 97530- Therapeutic activity, [id number]- Neuromuscular re-education, [phone]- Self Care, 02859- Manual therapy, [phone]- Gait training, [phone]- Canalith repositioning, [phone]- Aquatic Therapy, [phone]- Electrical stimulation (unattended), [phone]- Electrical stimulation (manual), [id number]- Vasopneumatic device, [id number]- Traction (mechanical), [id number]- Ionotophoresis 4mg /ml Dexamethasone, 20560 (1-2 muscles), 20561 (3+ muscles)- Dry Needling, Patient/Family education, Balance training, Stair training, Taping, Joint mobilization, Joint manipulation, Spinal manipulation, Spinal mobilization, Vestibular training, Cryotherapy, and Moist heat.  PLAN FOR NEXT SESSION: continue gentle ROM and strengthening; strengthening, flexibility/ROM, balance    Jarrell Laming, PT,  DPT 12/11/23, 10:13 AM  [facility] [address], [address] [place], [place] [address] Phone # [phone] Fax [phone]

## 2023-12-16 ENCOUNTER — Ambulatory Visit: Admitting: Physical Therapy

## 2023-12-16 ENCOUNTER — Encounter: Payer: Self-pay | Admitting: Physical Therapy

## 2023-12-16 DIAGNOSIS — M5459 Other low back pain: Secondary | ICD-10-CM

## 2023-12-16 DIAGNOSIS — M6281 Muscle weakness (generalized): Secondary | ICD-10-CM

## 2023-12-16 DIAGNOSIS — R2681 Unsteadiness on feet: Secondary | ICD-10-CM

## 2023-12-16 NOTE — Therapy (Signed)
 OUTPATIENT PHYSICAL THERAPY TREATMENT NOTE  Progress Note Reporting Period 11/06/2023 to 12/16/2023  See note below for Objective Data and Assessment of Progress/Goals.     Patient Name: Sabrina Castaneda MRN: [id number] DOB:09-24-1935, 88 y.o., female Today's Date: 12/16/2023     END OF SESSION:  PT End of Session - 12/16/23 1127     Visit Number 17    Date for Recertification  01/02/24    Authorization Type [employer]    Progress Note Due on Visit 27    PT Start Time 0936    PT Stop Time 1016    PT Time Calculation (min) 40 min    Activity Tolerance Patient tolerated treatment well    Behavior During Therapy [facility] for tasks assessed/performed             Past Medical History:  Diagnosis Date   Allergy    GERD (gastroesophageal reflux disease)    History of chicken pox    Hyperlipidemia    Scoliosis    Thyroid  disease    Past Surgical History:  Procedure Laterality Date   ABDOMINAL HYSTERECTOMY  1980   ADENOIDECTOMY     HERNIA REPAIR  02/04/1966   REPLACEMENT TOTAL KNEE Right    TONSILLECTOMY     WRIST FRACTURE SURGERY Bilateral    Patient Active Problem List   Diagnosis Date Noted   Acquired trigger finger of left middle finger 03/11/2023   Neck pain 12/09/2022   Cervical radiculopathy 11/18/2022   Scoliosis of thoracic spine 11/18/2022   Depression 03/14/2020   Overweight (BMI 25.0-29.9) 03/14/2020   Physical exam 09/10/2019   Hyperlipidemia 08/16/2019   Hypothyroid 08/16/2019    PCP:  Mahlon Comer BRAVO, MD  REFERRING PROVIDER: Laqueta Ozell BIRCH, MD  REFERRING DIAG: Joint Pain  Rationale for Evaluation and Treatment: Rehabilitation  THERAPY DIAG:  Other low back pain  Muscle weakness (generalized)  Unsteadiness on feet  ONSET DATE: May 2025  SUBJECTIVE:                                                                                                                                                                                            SUBJECTIVE STATEMENT: Patient reports her shoulder is a little more painful today due to the cold weather. 4/10  PERTINENT HISTORY:  Scoliosis; Hx right knee replacement; hx cervical fusion, pt reports diplopia   PAIN:  Are you having pain? Yes: NPRS scale: currently 0/10 Pain location: right shoulder/arm Pain description: stabbing pain that wouldn't go away Aggravating factors: walking across the street (she lives in [place]) Relieving factors: Nothing  PRECAUTIONS: Fall  RED FLAGS:  None   WEIGHT BEARING RESTRICTIONS: No  FALLS:  Has patient fallen in last 6 months? Yes. Number of falls 1 fall in September 2025   LIVING ENVIRONMENT: Lives with: lives alone in [place]  Lives in: House/apartment Stairs: No Has following equipment at home: Single point cane  OCCUPATION: Retired  PLOF: Independent, Independent with basic ADLs, Independent with household mobility without device, Independent with community mobility without device, Independent with gait, Independent with transfers, and Leisure: Playing cards, hanging out friends, events at [employer], reading  PATIENT GOALS: Find something to strengthen the muscles and relieve the pain  NEXT MD VISIT: PRN  OBJECTIVE:  Note: Objective measures were completed at Evaluation unless otherwise noted.  DIAGNOSTIC FINDINGS:  From 09/16/2023 MD Note: X-ray lumbar spine (08/25/2023): 5 lumbar-type vertebral bodies with severe levoconvex lumbar scoliosis. Grade 1 anterolisthesis of L3 on L4 and L4 on L5. Severe disc space narrowing at L4-5 and notable at L3-4 and L5-S1. Facet arthrosis noted throughout the lower lumbar spine.    MRI Cervical spine (01/13/2023): C2-3 moderate left and moderate-severe right foraminal stenosis. C3-4 moderate biforaminal stenosis. C4-5 with moderate left foraminal stenosis and C5-6 with moderate right foraminal stenosis.   MRI of Right Shoulder on 11/23/2023: Impression:  Full-thickness  retracted 22x19 mm anterior supraspinatus footprint tear with of the supraspinatus muscle belly and decreases bulk without fatty infiltration. Moderate infraspinatus tendinosis without tear. Mild-to-moderate long head biceps tendinosis without tear or subluxation. Mild superior humeral head chondral thinning with a moderate-to-large effusion, mild synovitis, and fluid decompressing into the subacromial-subdeltoid bursa. Moderate AC joint osteoarthritis.  PATIENT SURVEYS:  Eval:  Modified Oswestry: 9/50 18%  Minimally Clinically Important Difference (MCID) = 12.8%  11/04/2023: Quick DASH 52.27 Modified Oswestry Low Back Pain Disability Questionnaire: 12 / 50 = 24.0 %  12/16/2023 QuickDash:22.7/100 22.7% ODI:4/50 8%  COGNITION: Overall cognitive status: Within functional limits for tasks assessed     SENSATION: WFL  MUSCLE LENGTH: Hamstrings: WFL   POSTURE: rounded shoulders and forward head With scoliosis thoracic curve concave to the left  PALPATION: No tenderness in bilateral glutes or paraspinals  LUMBAR ROM: *pain  AROM eval  Flexion Can touch toes  Extension 90% limited *  Right lateral flexion Bends to knee joint  Left lateral flexion Bends to above knee joint *  Right rotation full  Left rotation 80% limited   (Blank rows = not tested)  LOWER EXTREMITY ROM:   Eval: PROM:   Limited hip IR bilateral ; unable to achieve FABER position on Rt   UPPER EXTREMITY A/ROM:  11/04/2022: Right shoulder flexion: 110 deg Right shoulder abduction:  140 deg  12/16/2023 Right shoulder flexion:130 deg pain coming down Right shoulder abduction: 140 deg  UPPER EXTREMITY MMT:  11/04/2023: Left shoulder strength 4+ to 5-/5 grossly throughout Right shoulder strength grossly 4/5 throughout   LOWER EXTREMITY MMT:    MMT Right eval Left eval  Hip flexion 4- 4-  Hip extension    Hip abduction 4- 4-  Hip adduction 4 4  Hip internal rotation    Hip external rotation     Knee flexion 4 4  Knee extension 4 4  Ankle dorsiflexion    Ankle plantarflexion    Ankle inversion    Ankle eversion     (Blank rows = not tested)   FUNCTIONAL TESTS:  Eval: 5 times sit to stand: 15.89 sec with UE support Timed up and go (TUG): 12.95 sec patient caught right foot behind left. Decreased speed  of turn  10/09/2023: 6 minute walk test:  1000 ft with RPE of 5-6/10 and pain increasing to 5/10 (age related norms are 1,155 ft)  12/11/2023: 6 minute walk test:  1170 ft with RPE of 5/10  GAIT: Comments: decreased cadence; Wide BOS  TREATMENT DATE:  12/16/2023: Nustep level 4 x7 min with PT present to discuss status ODI:4/50 8% QuickDash:22.7/100 22.7% Goal Assessment Lunge to BOSU (no UE support) - x10 bilat (4 LOB today using the barre for support- patient verbalized feeling unsteady) Standing heel raises holding 4# dumbbells 2x10 (one anterior LOB) Resisted walking (10#) backwards, sideways (patient got dizzy during this so took a rest break and provided water) she did not have any water today only orange juice Standing high marching on airex x 10 total Farmer's carry with 4# Dbs x 1 lap around clinic    12/11/2023: Nustep level 5 x7 min with PT present to discuss status 6 minute walk test:  1170 ft with RPE of 5/10 Lunge to BOSU (no UE support) - x10 bilat no LOB today Standing heel raises holding 4# dumbbells 2x10 Standing marching holding 4# dumbbells at shoulder height x10 bilat Standing high marching x10 Seated 3 way blue pball rollout x10 each direction FWD step ups on 6 step holding 4# dumbbells  Wall push up 2x5 Sit to/from stand with 5# kettlebell x12   12/09/2023: Nustep level 5 x7 min with PT present to discuss status Standing shoulder rows & extension  with green tband 2x10 while standing on airex 6 inch step ups holding 4# DBS x 10 bilateral  March on airex with 4#DBS (one waist height one shoulder height) 2 x 12 (switching arms) Sit to/from  stand holding 4# kettlebell x12 Hurdles (forwards/sideways) - step over pattern no UE support x 4 laps each Lunge to BOSU (no UE support) - no LOB today Standing heel raise 2 x 10 holding 4# Dbs Wall push up x 8 Farmer's carry with 4# Dbs x 1 lap around both gyms     12/04/2023: Nustep level 5 x7 min with PT present to discuss status Shoulder pulleys (scaption & flexion) x 2 mins each Finger ladder (flexion) x 3  Standing shoulder rows with green tband 2x10 Standing shoulder extension with green TB + march (first working same leg and arm then working opposite arm and leg) x 10 each Seated ear to hip 5# KB x 10 each direction (more painful going from left ear to right hip)  Sit to/from stand holding 5# kettlebell x10 Wall push up x 8 Lunge to BOSU (no UE support) - one LOB requiring stepping strategy x 10 bilateral  March on airex no UE support x 20 Airex step up + march x 10 bilateral (light UE support at counter) Standing heel raise 2 x 10 Resisted walking with handles 5# x 6 laps (increase next time) Seated 3 way blue pball rollout x8 each direction    PATIENT EDUCATION:  Education details: Provided patient education and discussion on quality of life and stress management. Discussed motivators, challenges, and goals for future.  Person educated: Patient Education method: Explanation, Demonstration, and Handouts Education comprehension: verbalized understanding, returned demonstration, and needs further education  HOME EXERCISE PROGRAM: Access Code: [email or web address] URL: [email or web address] Date: 11/04/2023 Prepared by: Jarrell Menke  Exercises - Supine Lower Trunk Rotation  - 1 x daily - 7 x weekly - 1 sets - 8 reps - 4s hold - Supine Alternating Knee Taps with Hands  - 1 x daily -  7 x weekly - 1 sets - 10 reps - 3s hold - Sit to Stand with Armchair  - 1 x daily - 7 x weekly - 1-2 sets - 10 reps - Seated Hip Abduction with Resistance  - 1 x daily - 7 x weekly - 2  sets - 10 reps - Seated March with Resistance  - 1 x daily - 7 x weekly - 2 sets - 10 reps - Standing Shoulder Row with Anchored Resistance  - 1 x daily - 7 x weekly - 2 sets - 10 reps - Seated Horizontal Smooth Pursuit  - 1 x daily - 7 x weekly - 2 sets - 10 reps - Seated Proximal-Distal Smooth Pursuit  - 1 x daily - 7 x weekly - 2 sets - 10 reps - Shoulder Flexion Wall Slide with Towel  - 1 x daily - 7 x weekly - 2 sets - 10 reps - Supine Shoulder Flexion with Dowel  - 1 x daily - 7 x weekly - 2 sets - 10 reps  ASSESSMENT:  CLINICAL IMPRESSION: 10th visit progress note completed today. Sabrina Castaneda presents with moderate shoulder pain levels due to the cold weather. She had a trip and fall last week resulting in having a large bruise on her right shin. This is patient's second fall since the start of POC. Her scores for the quickdash and ODI has improved since evaluation. She verbalized feeling a little unsteady today and she required more UE support to maintain balance. Overall, she is progressing well and is on track to meet goals at end of POC.  Sabrina Castaneda presents to skilled PT reporting that she is had some increased pain in her shoulder since her Tuesday visit, but she is feeling better today.  Patient admits to a fall Tuesday night when she was walking with her friend in their community on their way back to her home and it was dark and she did not see the hole in the ground and tripped.  Patient with a large bruise noted on her right shin.  Patient with improved distance noted on her 6 minute walk test today.  Patient continues to have some difficulty with lifting weight in right hand up to shoulder heigh.  Patient continues to require skilled PT to progress towards goal related activities.   OBJECTIVE IMPAIRMENTS: Abnormal gait, decreased balance, decreased endurance, decreased mobility, difficulty walking, decreased ROM, decreased strength, hypomobility, increased muscle spasms, impaired  flexibility, postural dysfunction, and pain.   ACTIVITY LIMITATIONS: carrying, lifting, bending, sitting, standing, squatting, stairs, transfers, bed mobility, and locomotion level  PARTICIPATION LIMITATIONS: meal prep, cleaning, laundry, interpersonal relationship, driving, and community activity  PERSONAL FACTORS: Age, Fitness, Time since onset of injury/illness/exacerbation, and 1 comorbidity: Scoliosis are also affecting patient's functional outcome.   REHAB POTENTIAL: Good  CLINICAL DECISION MAKING: Evolving/moderate complexity  EVALUATION COMPLEXITY: Moderate   GOALS: Goals reviewed with patient? Yes  SHORT TERM GOALS: Target date: 10/24/2023 Patient will be independent with initial HEP. Baseline:  Goal status: Met on 10/09/23  2.  Patient will be able to participate in a to establish a baseline for functional test. Baseline:  Goal status: Met on 10/09/23 (1,000 ft with RPE of 5-6/10 and increased pain)   LONG TERM GOALS: Target date: 01/02/2024  Patient will demonstrate independence in advanced HEP. Baseline:  Goal status: Ongoing 12/16/2023  2.  Patient will report > or = to 60% improvement in back and shoulder pain since starting PT. Baseline:  Goal status: Met on 12/02/2023 (pt reports 75% improvement)  3.  Patient will verbalize and demonstrate self-care strategies to manage pain including tissue mobility practices and change of position. Baseline:  Goal status: Ongoing 12/16/2023  4.  Patient will be able to walk across the street in her retirement facility with no back pain for improved functional mobility. Baseline:  Goal status: Progressing (partially met; able to do it sometimes with no pain ) 12/16/2023  5.  Patient will be able to carry at least 5# to help her ability to bring in groceries safely and without a loss of balance. Baseline:  Goal status: Progressing 12/16/2023  6.  Patient will be able to ambulate at least 864ft with LRAD without a loss  of balance to improve ability to ambulate community distances safely. Baseline:  Goal status: Ongoing (one fall on 12/09/23 secondary to tripping in a hole) 12/16/2023  7.  Patient will be increase right shoulder A/ROM to [facility] to allow her to place items in overhead cabinets/shelves. Baseline:  right shoulder flexion 110 degrees on 11/04/23 Goal status:  Ongoing  PLAN:  PT FREQUENCY: 1-2x/week  PT DURATION: 8 weeks  PLANNED INTERVENTIONS: 97164- PT Re-evaluation, 97750- Physical Performance Testing, 97110-Therapeutic exercises, 97530- Therapeutic activity, 97112- Neuromuscular re-education, 97535- Self Care, [id number]- Manual therapy, [phone]- Gait training, [phone]- Canalith repositioning, [id number]- Aquatic Therapy, [phone]- Electrical stimulation (unattended), [phone]- Electrical stimulation (manual), [id number]- Vasopneumatic device, [id number]- Traction (mechanical), [id number]- Ionotophoresis 4mg /ml Dexamethasone, 79439 (1-2 muscles), [id number] (3+ muscles)- Dry Needling, Patient/Family education, Balance training, Stair training, Taping, Joint mobilization, Joint manipulation, Spinal manipulation, Spinal mobilization, Vestibular training, Cryotherapy, and Moist heat.  PLAN FOR NEXT SESSION: continue gentle ROM and strengthening; strengthening, flexibility/ROM, balance     Kristeen Sar, PT, DPT 12/16/23 11:29 AM [facility] [address], [address] [place], [place] [address] Phone # [phone] Fax [phone]

## 2023-12-18 ENCOUNTER — Encounter: Payer: Self-pay | Admitting: Physical Therapy

## 2023-12-18 ENCOUNTER — Ambulatory Visit: Admitting: Physical Therapy

## 2023-12-18 DIAGNOSIS — R2681 Unsteadiness on feet: Secondary | ICD-10-CM

## 2023-12-18 DIAGNOSIS — M6281 Muscle weakness (generalized): Secondary | ICD-10-CM

## 2023-12-18 DIAGNOSIS — M5459 Other low back pain: Secondary | ICD-10-CM | POA: Diagnosis not present

## 2023-12-18 NOTE — Therapy (Signed)
 OUTPATIENT PHYSICAL THERAPY TREATMENT NOTE     Patient Name: Sabrina Castaneda MRN: [id number] DOB:1935-12-13, 88 y.o., female Today's Date: 12/18/2023     END OF SESSION:  PT End of Session - 12/18/23 1026     Visit Number 18    Date for Recertification  01/02/24    Authorization Type [employer]    Progress Note Due on Visit 27    PT Start Time 0933    PT Stop Time 1012    PT Time Calculation (min) 39 min    Activity Tolerance Patient tolerated treatment well    Behavior During Therapy [facility] for tasks assessed/performed              Past Medical History:  Diagnosis Date   Allergy    GERD (gastroesophageal reflux disease)    History of chicken pox    Hyperlipidemia    Scoliosis    Thyroid  disease    Past Surgical History:  Procedure Laterality Date   ABDOMINAL HYSTERECTOMY  1980   ADENOIDECTOMY     HERNIA REPAIR  02/04/1966   REPLACEMENT TOTAL KNEE Right    TONSILLECTOMY     WRIST FRACTURE SURGERY Bilateral    Patient Active Problem List   Diagnosis Date Noted   Acquired trigger finger of left middle finger 03/11/2023   Neck pain 12/09/2022   Cervical radiculopathy 11/18/2022   Scoliosis of thoracic spine 11/18/2022   Depression 03/14/2020   Overweight (BMI 25.0-29.9) 03/14/2020   Physical exam 09/10/2019   Hyperlipidemia 08/16/2019   Hypothyroid 08/16/2019    PCP:  Mahlon Comer BRAVO, MD  REFERRING PROVIDER: Laqueta Ozell BIRCH, MD  REFERRING DIAG: Joint Pain  Rationale for Evaluation and Treatment: Rehabilitation  THERAPY DIAG:  Other low back pain  Muscle weakness (generalized)  Unsteadiness on feet  ONSET DATE: May 2025  SUBJECTIVE:                                                                                                                                                                                           SUBJECTIVE STATEMENT: Patient reports she is doing better today. Last session she had an off today. 0/10  pain.  PERTINENT HISTORY:  Scoliosis; Hx right knee replacement; hx cervical fusion, pt reports diplopia   PAIN:  Are you having pain? Yes: NPRS scale: currently 0/10 Pain location: right shoulder/arm Pain description: stabbing pain that wouldn't go away Aggravating factors: walking across the street (she lives in [place]) Relieving factors: Nothing  PRECAUTIONS: Fall  RED FLAGS: None   WEIGHT BEARING RESTRICTIONS: No  FALLS:  Has patient fallen in last 6 months? Yes.  Number of falls 1 fall in September 2025   LIVING ENVIRONMENT: Lives with: lives alone in [place]  Lives in: House/apartment Stairs: No Has following equipment at home: Single point cane  OCCUPATION: Retired  PLOF: Independent, Independent with basic ADLs, Independent with household mobility without device, Independent with community mobility without device, Independent with gait, Independent with transfers, and Leisure: Playing cards, hanging out friends, events at [employer], reading  PATIENT GOALS: Find something to strengthen the muscles and relieve the pain  NEXT MD VISIT: PRN  OBJECTIVE:  Note: Objective measures were completed at Evaluation unless otherwise noted.  DIAGNOSTIC FINDINGS:  From 09/16/2023 MD Note: X-ray lumbar spine (08/25/2023): 5 lumbar-type vertebral bodies with severe levoconvex lumbar scoliosis. Grade 1 anterolisthesis of L3 on L4 and L4 on L5. Severe disc space narrowing at L4-5 and notable at L3-4 and L5-S1. Facet arthrosis noted throughout the lower lumbar spine.    MRI Cervical spine (01/13/2023): C2-3 moderate left and moderate-severe right foraminal stenosis. C3-4 moderate biforaminal stenosis. C4-5 with moderate left foraminal stenosis and C5-6 with moderate right foraminal stenosis.   MRI of Right Shoulder on 11/23/2023: Impression:  Full-thickness retracted 22x19 mm anterior supraspinatus footprint tear with of the supraspinatus muscle belly and decreases bulk  without fatty infiltration. Moderate infraspinatus tendinosis without tear. Mild-to-moderate long head biceps tendinosis without tear or subluxation. Mild superior humeral head chondral thinning with a moderate-to-large effusion, mild synovitis, and fluid decompressing into the subacromial-subdeltoid bursa. Moderate AC joint osteoarthritis.  PATIENT SURVEYS:  Eval:  Modified Oswestry: 9/50 18%  Minimally Clinically Important Difference (MCID) = 12.8%  11/04/2023: Quick DASH 52.27 Modified Oswestry Low Back Pain Disability Questionnaire: 12 / 50 = 24.0 %  12/16/2023 QuickDash:22.7/100 22.7% ODI:4/50 8%  COGNITION: Overall cognitive status: Within functional limits for tasks assessed     SENSATION: WFL  MUSCLE LENGTH: Hamstrings: WFL   POSTURE: rounded shoulders and forward head With scoliosis thoracic curve concave to the left  PALPATION: No tenderness in bilateral glutes or paraspinals  LUMBAR ROM: *pain  AROM eval  Flexion Can touch toes  Extension 90% limited *  Right lateral flexion Bends to knee joint  Left lateral flexion Bends to above knee joint *  Right rotation full  Left rotation 80% limited   (Blank rows = not tested)  LOWER EXTREMITY ROM:   Eval: PROM:   Limited hip IR bilateral ; unable to achieve FABER position on Rt   UPPER EXTREMITY A/ROM:  11/04/2022: Right shoulder flexion: 110 deg Right shoulder abduction:  140 deg  12/16/2023 Right shoulder flexion:130 deg pain coming down Right shoulder abduction: 140 deg  UPPER EXTREMITY MMT:  11/04/2023: Left shoulder strength 4+ to 5-/5 grossly throughout Right shoulder strength grossly 4/5 throughout   LOWER EXTREMITY MMT:    MMT Right eval Left eval  Hip flexion 4- 4-  Hip extension    Hip abduction 4- 4-  Hip adduction 4 4  Hip internal rotation    Hip external rotation    Knee flexion 4 4  Knee extension 4 4  Ankle dorsiflexion    Ankle plantarflexion    Ankle inversion     Ankle eversion     (Blank rows = not tested)   FUNCTIONAL TESTS:  Eval: 5 times sit to stand: 15.89 sec with UE support Timed up and go (TUG): 12.95 sec patient caught right foot behind left. Decreased speed of turn  10/09/2023: 6 minute walk test:  1000 ft with RPE of 5-6/10 and pain increasing  to 5/10 (age related norms are 1,155 ft)  12/11/2023: 6 minute walk test:  1170 ft with RPE of 5/10  GAIT: Comments: decreased cadence; Wide BOS  TREATMENT DATE:  12/18/2023: Pulleys (flexion/scaption) x 2 mins each 3 way stability ball stretch x 8 each direction Step taps at stair holding 4# DB x 20 total Step ups at stair doing 4# Dbs (SBA for PT- this was challenging) Lunge to BOSU (no UE support) - x10 bilat (1 LOB) Single leg on therapad + opposite leg swing (forward, sideways, backwards) x 8 each UE support at barre Standing high marching on airex x 20 total Heel raise on airex 2 x 10 Sit to standing holding 4# DB 2 x 8  Standing shoulder row & extension with green TB 2 x 10 Nustep level 5 x5 min with PT present to discuss status     12/16/2023: Nustep level 4 x7 min with PT present to discuss status ODI:4/50 8% QuickDash:22.7/100 22.7% Goal Assessment Lunge to BOSU (no UE support) - x10 bilat (4 LOB today using the barre for support- patient verbalized feeling unsteady) Standing heel raises holding 4# dumbbells 2x10 (one anterior LOB) Resisted walking (10#) backwards, sideways (patient got dizzy during this so took a rest break and provided water) she did not have any water today only orange juice Standing high marching on airex x 10 total Farmer's carry with 4# Dbs x 1 lap around clinic    12/11/2023: Nustep level 5 x7 min with PT present to discuss status 6 minute walk test:  1170 ft with RPE of 5/10 Lunge to BOSU (no UE support) - x10 bilat no LOB today Standing heel raises holding 4# dumbbells 2x10 Standing marching holding 4# dumbbells at shoulder height x10  bilat Standing high marching x10 Seated 3 way blue pball rollout x10 each direction FWD step ups on 6 step holding 4# dumbbells  Wall push up 2x5 Sit to/from stand with 5# kettlebell x12   12/09/2023: Nustep level 5 x7 min with PT present to discuss status Standing shoulder rows & extension  with green tband 2x10 while standing on airex 6 inch step ups holding 4# DBS x 10 bilateral  March on airex with 4#DBS (one waist height one shoulder height) 2 x 12 (switching arms) Sit to/from stand holding 4# kettlebell x12 Hurdles (forwards/sideways) - step over pattern no UE support x 4 laps each Lunge to BOSU (no UE support) - no LOB today Standing heel raise 2 x 10 holding 4# Dbs Wall push up x 8 Farmer's carry with 4# Dbs x 1 lap around both gyms     PATIENT EDUCATION:  Education details: Provided patient education and discussion on quality of life and stress management. Discussed motivators, challenges, and goals for future.  Person educated: Patient Education method: Explanation, Demonstration, and Handouts Education comprehension: verbalized understanding, returned demonstration, and needs further education  HOME EXERCISE PROGRAM: Access Code: [email or web address] URL: [email or web address] Date: 11/04/2023 Prepared by: Jarrell Menke  Exercises - Supine Lower Trunk Rotation  - 1 x daily - 7 x weekly - 1 sets - 8 reps - 4s hold - Supine Alternating Knee Taps with Hands  - 1 x daily - 7 x weekly - 1 sets - 10 reps - 3s hold - Sit to Stand with Armchair  - 1 x daily - 7 x weekly - 1-2 sets - 10 reps - Seated Hip Abduction with Resistance  - 1 x daily - 7 x weekly - 2 sets - 10  reps - Seated March with Resistance  - 1 x daily - 7 x weekly - 2 sets - 10 reps - Standing Shoulder Row with Anchored Resistance  - 1 x daily - 7 x weekly - 2 sets - 10 reps - Seated Horizontal Smooth Pursuit  - 1 x daily - 7 x weekly - 2 sets - 10 reps - Seated Proximal-Distal Smooth Pursuit  - 1 x  daily - 7 x weekly - 2 sets - 10 reps - Shoulder Flexion Wall Slide with Towel  - 1 x daily - 7 x weekly - 2 sets - 10 reps - Supine Shoulder Flexion with Dowel  - 1 x daily - 7 x weekly - 2 sets - 10 reps  ASSESSMENT:  CLINICAL IMPRESSION: Quaneshia verbalized feeling better than last treatment session. She did not verbalize any shoulder pain today. She was challenged with step ups with bilateral dumbbell hold. PT provided close guarding while she was performing exercise. Today was the first day she has not had any shoulder pain, which she feels is encouraging. She has an appointment tomorrow with her doctor about her shoulder pain. She is progressing well towards goals and would continue to benefit from skilled therapy to work on balance and shoulder strengthening.      OBJECTIVE IMPAIRMENTS: Abnormal gait, decreased balance, decreased endurance, decreased mobility, difficulty walking, decreased ROM, decreased strength, hypomobility, increased muscle spasms, impaired flexibility, postural dysfunction, and pain.   ACTIVITY LIMITATIONS: carrying, lifting, bending, sitting, standing, squatting, stairs, transfers, bed mobility, and locomotion level  PARTICIPATION LIMITATIONS: meal prep, cleaning, laundry, interpersonal relationship, driving, and community activity  PERSONAL FACTORS: Age, Fitness, Time since onset of injury/illness/exacerbation, and 1 comorbidity: Scoliosis are also affecting patient's functional outcome.   REHAB POTENTIAL: Good  CLINICAL DECISION MAKING: Evolving/moderate complexity  EVALUATION COMPLEXITY: Moderate   GOALS: Goals reviewed with patient? Yes  SHORT TERM GOALS: Target date: 10/24/2023 Patient will be independent with initial HEP. Baseline:  Goal status: Met on 10/09/23  2.  Patient will be able to participate in a to establish a baseline for functional test. Baseline:  Goal status: Met on 10/09/23 (1,000 ft with RPE of 5-6/10 and increased pain)   LONG  TERM GOALS: Target date: 01/02/2024  Patient will demonstrate independence in advanced HEP. Baseline:  Goal status: Ongoing 12/16/2023  2.  Patient will report > or = to 60% improvement in back and shoulder pain since starting PT. Baseline:  Goal status: Met on 12/02/2023 (pt reports 75% improvement)  3.  Patient will verbalize and demonstrate self-care strategies to manage pain including tissue mobility practices and change of position. Baseline:  Goal status: Ongoing 12/16/2023  4.  Patient will be able to walk across the street in her retirement facility with no back pain for improved functional mobility. Baseline:  Goal status: Progressing (partially met; able to do it sometimes with no pain ) 12/16/2023  5.  Patient will be able to carry at least 5# to help her ability to bring in groceries safely and without a loss of balance. Baseline:  Goal status: Progressing 12/16/2023  6.  Patient will be able to ambulate at least 898ft with LRAD without a loss of balance to improve ability to ambulate community distances safely. Baseline:  Goal status: Ongoing (one fall on 12/09/23 secondary to tripping in a hole) 12/16/2023  7.  Patient will be increase right shoulder A/ROM to [facility] to allow her to place items in overhead cabinets/shelves. Baseline:  right shoulder  flexion 110 degrees on 11/04/23 Goal status:  Ongoing  PLAN:  PT FREQUENCY: 1-2x/week  PT DURATION: 8 weeks  PLANNED INTERVENTIONS: 97164- PT Re-evaluation, 97750- Physical Performance Testing, 97110-Therapeutic exercises, 97530- Therapeutic activity, 97112- Neuromuscular re-education, 97535- Self Care, [id number]- Manual therapy, [phone]- Gait training, [phone]- Canalith repositioning, [id number]- Aquatic Therapy, [phone]- Electrical stimulation (unattended), [phone]- Electrical stimulation (manual), [id number]- Vasopneumatic device, [id number]- Traction (mechanical), [id number]- Ionotophoresis 4mg /ml Dexamethasone, 79439 (1-2 muscles), [id number] (3+ muscles)-  Dry Needling, Patient/Family education, Balance training, Stair training, Taping, Joint mobilization, Joint manipulation, Spinal manipulation, Spinal mobilization, Vestibular training, Cryotherapy, and Moist heat.  PLAN FOR NEXT SESSION: continue gentle ROM and strengthening; strengthening, flexibility/ROM, balance     Kristeen Sar, PT, DPT 12/18/23 10:30 AM [facility] [address], [address] [place], [place] [address] Phone # [phone] Fax [phone]

## 2023-12-23 ENCOUNTER — Ambulatory Visit: Admitting: Physical Therapy

## 2023-12-23 ENCOUNTER — Encounter: Payer: Self-pay | Admitting: Physical Therapy

## 2023-12-23 DIAGNOSIS — R2681 Unsteadiness on feet: Secondary | ICD-10-CM

## 2023-12-23 DIAGNOSIS — M5459 Other low back pain: Secondary | ICD-10-CM | POA: Diagnosis not present

## 2023-12-23 DIAGNOSIS — M6281 Muscle weakness (generalized): Secondary | ICD-10-CM

## 2023-12-23 NOTE — Therapy (Signed)
 OUTPATIENT PHYSICAL THERAPY TREATMENT NOTE     Patient Name: Sabrina Castaneda MRN: [id number] DOB:1935-11-10, 88 y.o., female Today's Date: 12/23/2023     END OF SESSION:  PT End of Session - 12/23/23 1022     Visit Number 19    Date for Recertification  01/02/24    Authorization Type [employer]    Progress Note Due on Visit 27    PT Start Time 0931    PT Stop Time 1013    PT Time Calculation (min) 42 min    Activity Tolerance Patient tolerated treatment well    Behavior During Therapy [facility] for tasks assessed/performed               Past Medical History:  Diagnosis Date   Allergy    GERD (gastroesophageal reflux disease)    History of chicken pox    Hyperlipidemia    Scoliosis    Thyroid  disease    Past Surgical History:  Procedure Laterality Date   ABDOMINAL HYSTERECTOMY  1980   ADENOIDECTOMY     HERNIA REPAIR  02/04/1966   REPLACEMENT TOTAL KNEE Right    TONSILLECTOMY     WRIST FRACTURE SURGERY Bilateral    Patient Active Problem List   Diagnosis Date Noted   Acquired trigger finger of left middle finger 03/11/2023   Neck pain 12/09/2022   Cervical radiculopathy 11/18/2022   Scoliosis of thoracic spine 11/18/2022   Depression 03/14/2020   Overweight (BMI 25.0-29.9) 03/14/2020   Physical exam 09/10/2019   Hyperlipidemia 08/16/2019   Hypothyroid 08/16/2019    PCP:  Mahlon Comer BRAVO, MD  REFERRING PROVIDER: Laqueta Ozell BIRCH, MD  REFERRING DIAG: Joint Pain  Rationale for Evaluation and Treatment: Rehabilitation  THERAPY DIAG:  Other low back pain  Muscle weakness (generalized)  Unsteadiness on feet  ONSET DATE: May 2025  SUBJECTIVE:                                                                                                                                                                                           SUBJECTIVE STATEMENT: Patient reports shoulder pain is on and off. Her MD was pleased with the progress she has made  with therapy.  PERTINENT HISTORY:  Scoliosis; Hx right knee replacement; hx cervical fusion, pt reports diplopia   PAIN:  Are you having pain? Yes: NPRS scale: currently 0/10 Pain location: right shoulder/arm Pain description: stabbing pain that wouldn't go away Aggravating factors: walking across the street (she lives in [place]) Relieving factors: Nothing  PRECAUTIONS: Fall  RED FLAGS: None   WEIGHT BEARING RESTRICTIONS: No  FALLS:  Has patient fallen  in last 6 months? Yes. Number of falls 1 fall in September 2025   LIVING ENVIRONMENT: Lives with: lives alone in [place]  Lives in: House/apartment Stairs: No Has following equipment at home: Single point cane  OCCUPATION: Retired  PLOF: Independent, Independent with basic ADLs, Independent with household mobility without device, Independent with community mobility without device, Independent with gait, Independent with transfers, and Leisure: Playing cards, hanging out friends, events at [employer], reading  PATIENT GOALS: Find something to strengthen the muscles and relieve the pain  NEXT MD VISIT: PRN  OBJECTIVE:  Note: Objective measures were completed at Evaluation unless otherwise noted.  DIAGNOSTIC FINDINGS:  From 09/16/2023 MD Note: X-ray lumbar spine (08/25/2023): 5 lumbar-type vertebral bodies with severe levoconvex lumbar scoliosis. Grade 1 anterolisthesis of L3 on L4 and L4 on L5. Severe disc space narrowing at L4-5 and notable at L3-4 and L5-S1. Facet arthrosis noted throughout the lower lumbar spine.    MRI Cervical spine (01/13/2023): C2-3 moderate left and moderate-severe right foraminal stenosis. C3-4 moderate biforaminal stenosis. C4-5 with moderate left foraminal stenosis and C5-6 with moderate right foraminal stenosis.   MRI of Right Shoulder on 11/23/2023: Impression:  Full-thickness retracted 22x19 mm anterior supraspinatus footprint tear with of the supraspinatus muscle belly and decreases  bulk without fatty infiltration. Moderate infraspinatus tendinosis without tear. Mild-to-moderate long head biceps tendinosis without tear or subluxation. Mild superior humeral head chondral thinning with a moderate-to-large effusion, mild synovitis, and fluid decompressing into the subacromial-subdeltoid bursa. Moderate AC joint osteoarthritis.  PATIENT SURVEYS:  Eval:  Modified Oswestry: 9/50 18%  Minimally Clinically Important Difference (MCID) = 12.8%  11/04/2023: Quick DASH 52.27 Modified Oswestry Low Back Pain Disability Questionnaire: 12 / 50 = 24.0 %  12/16/2023 QuickDash:22.7/100 22.7% ODI:4/50 8%  COGNITION: Overall cognitive status: Within functional limits for tasks assessed     SENSATION: WFL  MUSCLE LENGTH: Hamstrings: WFL   POSTURE: rounded shoulders and forward head With scoliosis thoracic curve concave to the left  PALPATION: No tenderness in bilateral glutes or paraspinals  LUMBAR ROM: *pain  AROM eval  Flexion Can touch toes  Extension 90% limited *  Right lateral flexion Bends to knee joint  Left lateral flexion Bends to above knee joint *  Right rotation full  Left rotation 80% limited   (Blank rows = not tested)  LOWER EXTREMITY ROM:   Eval: PROM:   Limited hip IR bilateral ; unable to achieve FABER position on Rt   UPPER EXTREMITY A/ROM:  11/04/2022: Right shoulder flexion: 110 deg Right shoulder abduction:  140 deg  12/16/2023 Right shoulder flexion:130 deg pain coming down Right shoulder abduction: 140 deg  UPPER EXTREMITY MMT:  11/04/2023: Left shoulder strength 4+ to 5-/5 grossly throughout Right shoulder strength grossly 4/5 throughout   LOWER EXTREMITY MMT:    MMT Right eval Left eval  Hip flexion 4- 4-  Hip extension    Hip abduction 4- 4-  Hip adduction 4 4  Hip internal rotation    Hip external rotation    Knee flexion 4 4  Knee extension 4 4  Ankle dorsiflexion    Ankle plantarflexion    Ankle inversion     Ankle eversion     (Blank rows = not tested)   FUNCTIONAL TESTS:  Eval: 5 times sit to stand: 15.89 sec with UE support Timed up and go (TUG): 12.95 sec patient caught right foot behind left. Decreased speed of turn  10/09/2023: 6 minute walk test:  1000 ft with RPE  of 5-6/10 and pain increasing to 5/10 (age related norms are 1,155 ft)  12/11/2023: 6 minute walk test:  1170 ft with RPE of 5/10  GAIT: Comments: decreased cadence; Wide BOS  TREATMENT DATE:  12/23/2023: Nustep level 5 x5 min with PT present to discuss status Finger ladder (flexion/scaption) x 4 each 3 way stab stabilization  Attempted 4D scap stabilization but patient had increased pain Step ups at stair holding 5# Dbs x 10 bilateral  Heels raise holding 5# Dbs 2 x 10 Resisted walking (10#) backwards, sideways (decreased to 5# when going to the Lt x5 each direction Patient had two LOB when going to the left requiring UE support from PT to maintain balance Standing high marching on airex x 20 total March on airex with unilateral 5# DB hold x 20 then switched hands Sit to standing holding 4# DB 2 x 8  Lunge to BOSU (no UE support) - x10 bilat     12/18/2023: Pulleys (flexion/scaption) x 2 mins each 3 way stability ball stretch x 8 each direction Step taps at stair holding 4# DB x 20 total Step ups at stair doing 4# Dbs (SBA for PT- this was challenging) Lunge to BOSU (no UE support) - x10 bilat (1 LOB) Single leg on therapad + opposite leg swing (forward, sideways, backwards) x 8 each UE support at barre Standing high marching on airex x 20 total Heel raise on airex 2 x 10 Sit to standing holding 4# DB 2 x 8  Standing shoulder row & extension with green TB 2 x 10 Nustep level 5 x5 min with PT present to discuss status     12/16/2023: Nustep level 4 x7 min with PT present to discuss status ODI:4/50 8% QuickDash:22.7/100 22.7% Goal Assessment Lunge to BOSU (no UE support) - x10 bilat (4 LOB today  using the barre for support- patient verbalized feeling unsteady) Standing heel raises holding 4# dumbbells 2x10 (one anterior LOB) Resisted walking (10#) backwards, sideways (patient got dizzy during this so took a rest break and provided water) she did not have any water today only orange juice Standing high marching on airex x 10 total Farmer's carry with 4# Dbs x 1 lap around clinic   PATIENT EDUCATION:  Education details: Provided patient education and discussion on quality of life and stress management. Discussed motivators, challenges, and goals for future.  Person educated: Patient Education method: Explanation, Demonstration, and Handouts Education comprehension: verbalized understanding, returned demonstration, and needs further education  HOME EXERCISE PROGRAM: Access Code: [email or web address] URL: [email or web address] Date: 11/04/2023 Prepared by: Jarrell Menke  Exercises - Supine Lower Trunk Rotation  - 1 x daily - 7 x weekly - 1 sets - 8 reps - 4s hold - Supine Alternating Knee Taps with Hands  - 1 x daily - 7 x weekly - 1 sets - 10 reps - 3s hold - Sit to Stand with Armchair  - 1 x daily - 7 x weekly - 1-2 sets - 10 reps - Seated Hip Abduction with Resistance  - 1 x daily - 7 x weekly - 2 sets - 10 reps - Seated March with Resistance  - 1 x daily - 7 x weekly - 2 sets - 10 reps - Standing Shoulder Row with Anchored Resistance  - 1 x daily - 7 x weekly - 2 sets - 10 reps - Seated Horizontal Smooth Pursuit  - 1 x daily - 7 x weekly - 2 sets - 10 reps - Seated Proximal-Distal Smooth Pursuit  -  1 x daily - 7 x weekly - 2 sets - 10 reps - Shoulder Flexion Wall Slide with Towel  - 1 x daily - 7 x weekly - 2 sets - 10 reps - Supine Shoulder Flexion with Dowel  - 1 x daily - 7 x weekly - 2 sets - 10 reps  ASSESSMENT:  CLINICAL IMPRESSION: Mickie had a follow up visit with he referring provider and he was impressed with the progress she is making with skilled therapy.  Discussed POC with patient and she agreed that she would be ready to discharge next treatment session. She will be going to [place] for the month of December and she  is pleased with her current functional status. With resisted walking noted one LOB requiring UE support from PT to maintain balance. Decreased weight and patient was able to perform sidestepping easier. Patient to discharge home with HEP next session.     OBJECTIVE IMPAIRMENTS: Abnormal gait, decreased balance, decreased endurance, decreased mobility, difficulty walking, decreased ROM, decreased strength, hypomobility, increased muscle spasms, impaired flexibility, postural dysfunction, and pain.   ACTIVITY LIMITATIONS: carrying, lifting, bending, sitting, standing, squatting, stairs, transfers, bed mobility, and locomotion level  PARTICIPATION LIMITATIONS: meal prep, cleaning, laundry, interpersonal relationship, driving, and community activity  PERSONAL FACTORS: Age, Fitness, Time since onset of injury/illness/exacerbation, and 1 comorbidity: Scoliosis are also affecting patient's functional outcome.   REHAB POTENTIAL: Good  CLINICAL DECISION MAKING: Evolving/moderate complexity  EVALUATION COMPLEXITY: Moderate   GOALS: Goals reviewed with patient? Yes  SHORT TERM GOALS: Target date: 10/24/2023 Patient will be independent with initial HEP. Baseline:  Goal status: Met on 10/09/23  2.  Patient will be able to participate in a to establish a baseline for functional test. Baseline:  Goal status: Met on 10/09/23 (1,000 ft with RPE of 5-6/10 and increased pain)   LONG TERM GOALS: Target date: 01/02/2024  Patient will demonstrate independence in advanced HEP. Baseline:  Goal status: Ongoing 12/16/2023  2.  Patient will report > or = to 60% improvement in back and shoulder pain since starting PT. Baseline:  Goal status: Met on 12/02/2023 (pt reports 75% improvement)  3.  Patient will verbalize and demonstrate  self-care strategies to manage pain including tissue mobility practices and change of position. Baseline:  Goal status: Ongoing 12/16/2023  4.  Patient will be able to walk across the street in her retirement facility with no back pain for improved functional mobility. Baseline:  Goal status: Progressing (partially met; able to do it sometimes with no pain ) 12/16/2023  5.  Patient will be able to carry at least 5# to help her ability to bring in groceries safely and without a loss of balance. Baseline:  Goal status: Progressing 12/16/2023  6.  Patient will be able to ambulate at least 819ft with LRAD without a loss of balance to improve ability to ambulate community distances safely. Baseline:  Goal status: Ongoing (one fall on 12/09/23 secondary to tripping in a hole) 12/16/2023  7.  Patient will be increase right shoulder A/ROM to [facility] to allow her to place items in overhead cabinets/shelves. Baseline:  right shoulder flexion 110 degrees on 11/04/23 Goal status:  Ongoing  PLAN:  PT FREQUENCY: 1-2x/week  PT DURATION: 8 weeks  PLANNED INTERVENTIONS: 97164- PT Re-evaluation, 97750- Physical Performance Testing, 97110-Therapeutic exercises, 97530- Therapeutic activity, [id number]- Neuromuscular re-education, 97535- Self Care, [id number]- Manual therapy, [id number]- Gait training, [phone]- Canalith repositioning, [id number]- Aquatic Therapy, H9716- Electrical stimulation (unattended), [id number]- Electrical stimulation (manual), [id number]- Vasopneumatic  device, [id number]- Traction (mechanical), 02966- Ionotophoresis 4mg /ml Dexamethasone, 20560 (1-2 muscles), 20561 (3+ muscles)- Dry Needling, Patient/Family education, Balance training, Stair training, Taping, Joint mobilization, Joint manipulation, Spinal manipulation, Spinal mobilization, Vestibular training, Cryotherapy, and Moist heat.  PLAN FOR NEXT SESSION: Plan to discharge next treatment session     Kristeen Sar, PT, DPT 12/23/23 10:23 AM [facility] [address] [place], [place] [address] Phone # [phone] Fax [phone]

## 2023-12-25 ENCOUNTER — Ambulatory Visit: Admitting: Physical Therapy

## 2023-12-25 ENCOUNTER — Encounter: Payer: Self-pay | Admitting: Physical Therapy

## 2023-12-25 DIAGNOSIS — M5459 Other low back pain: Secondary | ICD-10-CM | POA: Diagnosis not present

## 2023-12-25 DIAGNOSIS — M6281 Muscle weakness (generalized): Secondary | ICD-10-CM

## 2023-12-25 DIAGNOSIS — R2681 Unsteadiness on feet: Secondary | ICD-10-CM

## 2023-12-25 NOTE — Therapy (Signed)
 OUTPATIENT PHYSICAL THERAPY TREATMENT NOTE / DISCHARGE NOTE    Patient Name: Sabrina Castaneda MRN: [id number] DOB:[birth year], 88 y.o., female Today's Date: 12/25/2023     END OF SESSION:  PT End of Session - 12/25/23 1026     Visit Number 20    Date for Recertification  01/02/24    Authorization Type [employer]    Progress Note Due on Visit 27    PT Start Time 0927    PT Stop Time 1015    PT Time Calculation (min) 48 min    Activity Tolerance Patient tolerated treatment well    Behavior During Therapy [facility] for tasks assessed/performed                Past Medical History:  Diagnosis Date   Allergy    GERD (gastroesophageal reflux disease)    History of chicken pox    Hyperlipidemia    Scoliosis    Thyroid  disease    Past Surgical History:  Procedure Laterality Date   ABDOMINAL HYSTERECTOMY  1980   ADENOIDECTOMY     HERNIA REPAIR  02/04/1966   REPLACEMENT TOTAL KNEE Right    TONSILLECTOMY     WRIST FRACTURE SURGERY Bilateral    Patient Active Problem List   Diagnosis Date Noted   Acquired trigger finger of left middle finger 03/11/2023   Neck pain 12/09/2022   Cervical radiculopathy 11/18/2022   Scoliosis of thoracic spine 11/18/2022   Depression 03/14/2020   Overweight (BMI 25.0-29.9) 03/14/2020   Physical exam 09/10/2019   Hyperlipidemia 08/16/2019   Hypothyroid 08/16/2019    PCP:  Mahlon Comer BRAVO, MD  REFERRING PROVIDER: Laqueta Ozell BIRCH, MD  REFERRING DIAG: Joint Pain  Rationale for Evaluation and Treatment: Rehabilitation  THERAPY DIAG:  Other low back pain  Muscle weakness (generalized)  Unsteadiness on feet  ONSET DATE: May 2025  SUBJECTIVE:                                                                                                                                                                                           SUBJECTIVE STATEMENT: Patient reports shoulder pain is on and off. Her MD was pleased with the  progress she has made with therapy.  PERTINENT HISTORY:  Scoliosis; Hx right knee replacement; hx cervical fusion, pt reports diplopia   PAIN:  Are you having pain? Yes: NPRS scale: currently 0/10 Pain location: right shoulder/arm Pain description: stabbing pain that wouldn't go away Aggravating factors: walking across the street (she lives in [place]) Relieving factors: Nothing  PRECAUTIONS: Fall  RED FLAGS: None   WEIGHT BEARING RESTRICTIONS: No  FALLS:  Has patient fallen in last 6 months? Yes. Number of falls 1 fall in September 2025   LIVING ENVIRONMENT: Lives with: lives alone in [place]  Lives in: House/apartment Stairs: No Has following equipment at home: Single point cane  OCCUPATION: Retired  PLOF: Independent, Independent with basic ADLs, Independent with household mobility without device, Independent with community mobility without device, Independent with gait, Independent with transfers, and Leisure: Playing cards, hanging out friends, events at [employer], reading  PATIENT GOALS: Find something to strengthen the muscles and relieve the pain  NEXT MD VISIT: PRN  OBJECTIVE:  Note: Objective measures were completed at Evaluation unless otherwise noted.  DIAGNOSTIC FINDINGS:  From 09/16/2023 MD Note: X-ray lumbar spine (08/25/2023): 5 lumbar-type vertebral bodies with severe levoconvex lumbar scoliosis. Grade 1 anterolisthesis of L3 on L4 and L4 on L5. Severe disc space narrowing at L4-5 and notable at L3-4 and L5-S1. Facet arthrosis noted throughout the lower lumbar spine.    MRI Cervical spine (01/13/2023): C2-3 moderate left and moderate-severe right foraminal stenosis. C3-4 moderate biforaminal stenosis. C4-5 with moderate left foraminal stenosis and C5-6 with moderate right foraminal stenosis.   MRI of Right Shoulder on 11/23/2023: Impression:  Full-thickness retracted 22x19 mm anterior supraspinatus footprint tear with of the supraspinatus muscle  belly and decreases bulk without fatty infiltration. Moderate infraspinatus tendinosis without tear. Mild-to-moderate long head biceps tendinosis without tear or subluxation. Mild superior humeral head chondral thinning with a moderate-to-large effusion, mild synovitis, and fluid decompressing into the subacromial-subdeltoid bursa. Moderate AC joint osteoarthritis.  PATIENT SURVEYS:  Eval:  Modified Oswestry: 9/50 18%  Minimally Clinically Important Difference (MCID) = 12.8%  11/04/2023: Quick DASH 52.27 Modified Oswestry Low Back Pain Disability Questionnaire: 12 / 50 = 24.0 %  12/16/2023 QuickDash:22.7/100 22.7% ODI:4/50 8%  COGNITION: Overall cognitive status: Within functional limits for tasks assessed     SENSATION: WFL  MUSCLE LENGTH: Hamstrings: WFL   POSTURE: rounded shoulders and forward head With scoliosis thoracic curve concave to the left  PALPATION: No tenderness in bilateral glutes or paraspinals  LUMBAR ROM: *pain  AROM eval  Flexion Can touch toes  Extension 90% limited *  Right lateral flexion Bends to knee joint  Left lateral flexion Bends to above knee joint *  Right rotation full  Left rotation 80% limited   (Blank rows = not tested)  LOWER EXTREMITY ROM:   Eval: PROM:   Limited hip IR bilateral ; unable to achieve FABER position on Rt   UPPER EXTREMITY A/ROM:  11/04/2022: Right shoulder flexion: 110 deg Right shoulder abduction:  140 deg  12/16/2023 Right shoulder flexion:130 deg pain coming down Right shoulder abduction: 140 deg  UPPER EXTREMITY MMT:  11/04/2023: Left shoulder strength 4+ to 5-/5 grossly throughout Right shoulder strength grossly 4/5 throughout   LOWER EXTREMITY MMT:    MMT Right eval Left eval  Hip flexion 4- 4-  Hip extension    Hip abduction 4- 4-  Hip adduction 4 4  Hip internal rotation    Hip external rotation    Knee flexion 4 4  Knee extension 4 4  Ankle dorsiflexion    Ankle plantarflexion     Ankle inversion    Ankle eversion     (Blank rows = not tested)   FUNCTIONAL TESTS:  Eval: 5 times sit to stand: 15.89 sec with UE support Timed up and go (TUG): 12.95 sec patient caught right foot behind left. Decreased speed of turn  10/09/2023: 6 minute walk test:  1000  ft with RPE of 5-6/10 and pain increasing to 5/10 (age related norms are 1,155 ft)  12/11/2023: 6 minute walk test:  1170 ft with RPE of 5/10  GAIT: Comments: decreased cadence; Wide BOS  TREATMENT DATE:  12/25/2023: Nustep level 5 x7 min with PT present to discuss status Review & update of HEP exercises Shoulder pulleys (flexion & scaption) x 2 mins Standing shoulder row with green TB 2 x 10 Standing shoulder extension with green TB x 10  Single leg on therapad + opposite leg swing (forward, sideways, backwards) x 8 each UE support at barre Standing march holding 4# Dbs 2 x 20 6inch step ups holding 4# DbS x 1 bilateral (SBA) Heels raise holding 5# Dbs 2 x 10 Walking to cancer gym and back to assess patient's balance Farmer's carry 4# DB (to cancer gym and back)    12/23/2023: Nustep level 5 x5 min with PT present to discuss status Finger ladder (flexion/scaption) x 4 each 3 way stab stabilization  Attempted 4D scap stabilization but patient had increased pain Step ups at stair holding 5# Dbs x 10 bilateral  Heels raise holding 5# Dbs 2 x 10 Resisted walking (10#) backwards, sideways (decreased to 5# when going to the Lt x5 each direction Patient had two LOB when going to the left requiring UE support from PT to maintain balance Standing high marching on airex x 20 total March on airex with unilateral 5# DB hold x 20 then switched hands Sit to standing holding 4# DB 2 x 8  Lunge to BOSU (no UE support) - x10 bilat     12/18/2023: Pulleys (flexion/scaption) x 2 mins each 3 way stability ball stretch x 8 each direction Step taps at stair holding 4# DB x 20 total Step ups at stair doing 4# Dbs  (SBA for PT- this was challenging) Lunge to BOSU (no UE support) - x10 bilat (1 LOB) Single leg on therapad + opposite leg swing (forward, sideways, backwards) x 8 each UE support at barre Standing high marching on airex x 20 total Heel raise on airex 2 x 10 Sit to standing holding 4# DB 2 x 8  Standing shoulder row & extension with green TB 2 x 10 Nustep level 5 x5 min with PT present to discuss status     12/16/2023: Nustep level 4 x7 min with PT present to discuss status ODI:4/50 8% QuickDash:22.7/100 22.7% Goal Assessment Lunge to BOSU (no UE support) - x10 bilat (4 LOB today using the barre for support- patient verbalized feeling unsteady) Standing heel raises holding 4# dumbbells 2x10 (one anterior LOB) Resisted walking (10#) backwards, sideways (patient got dizzy during this so took a rest break and provided water) she did not have any water today only orange juice Standing high marching on airex x 10 total Farmer's carry with 4# Dbs x 1 lap around clinic   PATIENT EDUCATION:  Education details: Provided patient education and discussion on quality of life and stress management. Discussed motivators, challenges, and goals for future.  Person educated: Patient Education method: Explanation, Demonstration, and Handouts Education comprehension: verbalized understanding, returned demonstration, and needs further education  HOME EXERCISE PROGRAM: Access Code: [email or web address] URL: [email or web address] Date: 12/25/2023 Prepared by: Kristeen Sar  Exercises - Supine Lower Trunk Rotation  - 1 x daily - 7 x weekly - 1 sets - 8 reps - 4s hold - Supine Alternating Knee Taps with Hands  - 1 x daily - 7 x weekly - 1 sets - 10  reps - 3s hold - Sit to Stand with Armchair  - 1 x daily - 7 x weekly - 1-2 sets - 10 reps - Seated Hip Abduction with Resistance  - 1 x daily - 7 x weekly - 2 sets - 10 reps - Seated March with Resistance  - 1 x daily - 7 x weekly - 2 sets - 10 reps -  Standing Shoulder Row with Anchored Resistance  - 1 x daily - 7 x weekly - 2 sets - 10 reps - Shoulder extension with resistance - Neutral  - 1 x daily - 7 x weekly - 2 sets - 10 reps - Seated Horizontal Smooth Pursuit  - 1 x daily - 7 x weekly - 2 sets - 10 reps - Seated Proximal-Distal Smooth Pursuit  - 1 x daily - 7 x weekly - 2 sets - 10 reps - Shoulder Flexion Wall Slide with Towel  - 1 x daily - 7 x weekly - 1-2 sets - 10 reps - Supine Shoulder Flexion with Dowel  - 1 x daily - 7 x weekly - 2 sets - 10 reps - Standing March with Unilateral Counter Support  - 1 x daily - 7 x weekly - 2 sets - 10 reps - Seated Shoulder Flexion AAROM with Pulley Behind  - 1 x daily - 7 x weekly - 36m hold - Seated Shoulder Scaption AAROM with Pulley at Side  - 1 x daily - 7 x weekly - 35m hold - Seated Chest Press with Dumbbells  - 1 x daily - 7 x weekly - 2 sets - 10 reps - Seated Diagonal Chops with Medicine Ball  - 1 x daily - 7 x weekly - 1 sets - 10 reps - Side Stepping with Resistance at Ankles  - 1 x daily - 7 x weekly - 1-2 sets - 10 reps - Standing 3-Way Leg Reach with Resistance at Ankles and Counter Support  - 1 x daily - 7 x weekly - 1 sets - 8 reps  ASSESSMENT:  CLINICAL IMPRESSION: Sabrina Castaneda has made great improvements with skilled therapy. All goals are met at this time. Sabrina Castaneda verbalized improved right shoulder function and endurance since starting skilled therapy. Reviewed HEP and updated it to incorporate exercise progressions. Educated patient on days she feels unsteady and not as balanced to use her cane. She stated she feels a little prideful about using her cane. Reinforced the safety of making sure she uses her cane when she needs it. Patient to discharge home with HEP.     OBJECTIVE IMPAIRMENTS: Abnormal gait, decreased balance, decreased endurance, decreased mobility, difficulty walking, decreased ROM, decreased strength, hypomobility, increased muscle spasms, impaired flexibility, postural  dysfunction, and pain.   ACTIVITY LIMITATIONS: carrying, lifting, bending, sitting, standing, squatting, stairs, transfers, bed mobility, and locomotion level  PARTICIPATION LIMITATIONS: meal prep, cleaning, laundry, interpersonal relationship, driving, and community activity  PERSONAL FACTORS: Age, Fitness, Time since onset of injury/illness/exacerbation, and 1 comorbidity: Scoliosis are also affecting patient's functional outcome.   REHAB POTENTIAL: Good  CLINICAL DECISION MAKING: Evolving/moderate complexity  EVALUATION COMPLEXITY: Moderate   GOALS: Goals reviewed with patient? Yes  SHORT TERM GOALS: Target date: 10/24/2023 Patient will be independent with initial HEP. Baseline:  Goal status: Met on 10/09/23  2.  Patient will be able to participate in a to establish a baseline for functional test. Baseline:  Goal status: Met on 10/09/23 (1,000 ft with RPE of 5-6/10 and increased pain)   LONG TERM  GOALS: Target date: 01/02/2024  Patient will demonstrate independence in advanced HEP. Baseline:  Goal status: met 12/25/2023  2.  Patient will report > or = to 60% improvement in back and shoulder pain since starting PT. Baseline:  Goal status: Met on 12/02/2023 (pt reports 75% improvement)  3.  Patient will verbalize and demonstrate self-care strategies to manage pain including tissue mobility practices and change of position. Baseline:  Goal status: met 12/2023  4.  Patient will be able to walk across the street in her retirement facility with no back pain for improved functional mobility. Baseline:  Goal status: partially met (some days she does not have pain, others she does) 12/25/2023  5.  Patient will be able to carry at least 5# to help her ability to bring in groceries safely and without a loss of balance. Baseline:  Goal status: MET 12/25/2023  6.  Patient will be able to ambulate at least 852ft with LRAD without a loss of balance to improve ability to ambulate  community distances safely. Baseline:  Goal status: MET (one fall on 12/09/23 secondary to tripping in a hole) 12/25/2023  7.  Patient will be increase right shoulder A/ROM to [facility] to allow her to place items in overhead cabinets/shelves. Baseline:  right shoulder flexion 110 degrees on 11/04/23 Goal status:  MET 12/25/2023  PLAN:  PT FREQUENCY: 1-2x/week  PT DURATION: 8 weeks  PLANNED INTERVENTIONS: 97164- PT Re-evaluation, 97750- Physical Performance Testing, 97110-Therapeutic exercises, 97530- Therapeutic activity, 97112- Neuromuscular re-education, 97535- Self Care, [id number]- Manual therapy, [phone]- Gait training, [phone]- Canalith repositioning, [id number]- Aquatic Therapy, [phone]- Electrical stimulation (unattended), [phone]- Electrical stimulation (manual), [id number]- Vasopneumatic device, [id number]- Traction (mechanical), [id number]- Ionotophoresis 4mg /ml Dexamethasone, 79439 (1-2 muscles), [id number] (3+ muscles)- Dry Needling, Patient/Family education, Balance training, Stair training, Taping, Joint mobilization, Joint manipulation, Spinal manipulation, Spinal mobilization, Vestibular training, Cryotherapy, and Moist heat.  PLAN FOR NEXT SESSION: Patient to discharge home with HEP     Kristeen Sar, PT, DPT 12/25/23 10:27 AM [facility] [address] [place], [place] [address] Phone # [phone] Fax [phone]  PHYSICAL THERAPY DISCHARGE SUMMARY  Visits from Start of Care: 20  Current functional level related to goals / functional outcomes: See above   Remaining deficits: Occasional arthritis pain in shoulder   Education / Equipment: See above   Patient agrees to discharge. Patient goals were met. Patient is being discharged due to being pleased with the current functional level.

## 2023-12-30 ENCOUNTER — Encounter: Admitting: Rehabilitative and Restorative Service Providers"

## 2024-01-21 ENCOUNTER — Encounter: Admitting: Family Medicine

## 2024-01-31 ENCOUNTER — Other Ambulatory Visit: Payer: Self-pay | Admitting: Family Medicine

## 2024-02-09 ENCOUNTER — Ambulatory Visit (HOSPITAL_BASED_OUTPATIENT_CLINIC_OR_DEPARTMENT_OTHER): Admitting: Pulmonary Disease

## 2024-02-22 ENCOUNTER — Other Ambulatory Visit: Payer: Self-pay | Admitting: Family Medicine

## 2024-05-24 ENCOUNTER — Encounter: Admitting: Family Medicine
